# Patient Record
Sex: Female | Born: 1965 | Hispanic: No | Marital: Single | Smoking: Never smoker
Health system: Southern US, Community
[De-identification: ages and names within clinical notes are randomized; demographics above are authoritative.]

## PROBLEM LIST (undated history)

## (undated) DIAGNOSIS — E559 Vitamin D deficiency, unspecified: Secondary | ICD-10-CM

## (undated) HISTORY — PX: DIAPHRAGMATIC HERNIA REPAIR: SHX413

## (undated) HISTORY — PX: APPENDECTOMY: SHX54

## (undated) HISTORY — DX: Vitamin D deficiency, unspecified: E55.9

---

## 2017-06-23 ENCOUNTER — Ambulatory Visit (HOSPITAL_COMMUNITY)
Admission: EM | Admit: 2017-06-23 | Discharge: 2017-06-23 | Disposition: A | Discharge status: Nursing Home (Includes ICF) | Payer: Self-pay

## 2017-06-24 ENCOUNTER — Ambulatory Visit (INDEPENDENT_AMBULATORY_CARE_PROVIDER_SITE_OTHER): Payer: Self-pay

## 2017-06-24 ENCOUNTER — Encounter (HOSPITAL_COMMUNITY): Payer: Self-pay | Admitting: Emergency Medicine

## 2017-06-24 ENCOUNTER — Ambulatory Visit (HOSPITAL_COMMUNITY)
Admission: EM | Admit: 2017-06-24 | Discharge: 2017-06-24 | Disposition: A | Discharge status: Home / Self Care | Payer: Self-pay | Attending: Internal Medicine | Admitting: Internal Medicine

## 2017-06-24 DIAGNOSIS — M79672 Pain in left foot: Secondary | ICD-10-CM

## 2017-06-24 DIAGNOSIS — M2140 Flat foot [pes planus] (acquired), unspecified foot: Secondary | ICD-10-CM

## 2017-06-24 LAB — GLUCOSE, CAPILLARY: Glucose-Capillary: 80 mg/dL (ref 65–99)

## 2017-06-24 MED ORDER — NAPROXEN 500 MG PO TABS: 500.0000 mg | ORAL_TABLET | Freq: Two times a day (BID) | ORAL | 0 refills | Status: DC

## 2017-06-24 NOTE — ED Triage Notes (Signed)
PT reports pain in left foot for 3 weeks. Swelling and pain in right arm for months, and pain in both knees for 2 years.

## 2017-06-24 NOTE — Discharge Instructions (Addendum)
Take the medication as prescribed and try not to miss doses for the first 5 or so days.  Okay to wean off after that. Go to a shoe store and by a shoe that has a supportive arch.

## 2017-06-24 NOTE — ED Provider Notes (Signed)
CSN: [id number]     Arrival date & time 06/24/17  1742 History   None    Chief Complaint  Patient presents with  . Foot Pain   (Consider location/radiation/quality/duration/timing/severity/associated sxs/prior Treatment)  Foot Pain  This is a new problem. The current episode started more than 1 week ago. The problem occurs constantly. The problem has been gradually worsening. Pertinent negatives include no chest pain, no abdominal pain, no headaches and no shortness of breath. The symptoms are aggravated by walking. The symptoms are relieved by acetaminophen. She has tried acetaminophen for the symptoms. The treatment provided mild relief.    History reviewed. No pertinent past medical history. History reviewed. No pertinent surgical history. No family history on file. Social History  Substance Use Topics  . Smoking status: Not on file  . Smokeless tobacco: Not on file  . Alcohol use Not on file   OB History    No data available     Review of Systems  Respiratory: Negative for shortness of breath.   Cardiovascular: Negative for chest pain.  Gastrointestinal: Negative for abdominal pain.  Neurological: Negative for headaches.    Allergies  Patient has no known allergies.  Home Medications   Prior to Admission medications   Medication Sig Start Date End Date Taking? Authorizing Provider  naproxen (NAPROSYN) 500 MG tablet Take 1 tablet (500 mg total) by mouth 2 (two) times daily with a meal. Avoid NSAID medications while taking this. 06/24/17 07/04/17  Ofilia Neaslark, Allure Greaser L, PA-C   Meds Ordered and Administered this Visit  Medications - No data to display  BP 125/68   Pulse 77   Temp 98.9 F (37.2 C) (Oral)   Resp 16   Ht 5\' 1"  (1.549 m)   Wt 154 lb 5.2 oz (70 kg)   SpO2 100%   BMI 29.16 kg/m  No data found.   Physical Exam  Constitutional: She is oriented to person, place, and time. She appears well-nourished. No distress.  Eyes: Pupils are equal, round, and  reactive to light. EOM are normal.  Cardiovascular: Normal rate.   Pulmonary/Chest: Effort normal.  Abdominal: She exhibits no distension.  Musculoskeletal: She exhibits no edema, tenderness or deformity.  Mild tenderness about the distal 2nd and 3rd metacarpals. No rash, no swelling.   Neurological: She is alert and oriented to person, place, and time. No cranial nerve deficit. Gait normal.  Skin: Skin is dry. She is not diaphoretic.  Psychiatric: She has a normal mood and affect.  Vitals reviewed.   Urgent Care Course     Procedures (including critical care time)  Labs Review Labs Reviewed  GLUCOSE, CAPILLARY    Imaging Review Dg Foot Complete Left  Result Date: 06/24/2017 CLINICAL DATA:  Initial evaluation for acute metatarsal foot pain. EXAM: LEFT FOOT - COMPLETE 3+ VIEW COMPARISON:  None available. FINDINGS: No acute fracture or dislocation. Joint spaces are well maintained without evidence for significant degenerative or erosive arthropathy. Mild pes planus foot deformity noted. Osseous mineralization normal. No soft tissue abnormality. IMPRESSION: No acute osseous abnormality about the left foot. Electronically Signed   By: Rise MuBenjamin  McClintock M.D.   On: 06/24/2017 19:42          MDM   1. Left foot pain    Advised that she try an NSAID. Advised she get some arches given pes planus. She will come back if needed.     Ofilia Neaslark, Ole Lafon L, PA-C 06/24/17 1947

## 2017-06-26 ENCOUNTER — Emergency Department (HOSPITAL_COMMUNITY): Payer: Self-pay

## 2017-06-26 ENCOUNTER — Emergency Department (HOSPITAL_COMMUNITY)
Admission: EM | Admit: 2017-06-26 | Discharge: 2017-06-27 | Disposition: A | Discharge status: Home / Self Care | Payer: Self-pay | Attending: Emergency Medicine | Admitting: Emergency Medicine

## 2017-06-26 ENCOUNTER — Encounter (HOSPITAL_COMMUNITY): Payer: Self-pay | Admitting: Emergency Medicine

## 2017-06-26 DIAGNOSIS — R079 Chest pain, unspecified: Secondary | ICD-10-CM

## 2017-06-26 DIAGNOSIS — I700 Atherosclerosis of aorta: Secondary | ICD-10-CM

## 2017-06-26 HISTORY — DX: Atherosclerosis of aorta: I70.0

## 2017-06-26 LAB — CBC
HEMATOCRIT: 40.5 % (ref 36.0–46.0)
HEMOGLOBIN: 13.4 g/dL (ref 12.0–15.0)
MCH: 29.6 pg (ref 26.0–34.0)
MCHC: 33.1 g/dL (ref 30.0–36.0)
MCV: 89.4 fL (ref 78.0–100.0)
Platelets: 213 10*3/uL (ref 150–400)
RBC: 4.53 MIL/uL (ref 3.87–5.11)
RDW: 13.2 % (ref 11.5–15.5)
WBC: 5.5 10*3/uL (ref 4.0–10.5)

## 2017-06-26 LAB — BASIC METABOLIC PANEL
ANION GAP: 8 (ref 5–15)
BUN: 7 mg/dL (ref 6–20)
CALCIUM: 9.5 mg/dL (ref 8.9–10.3)
CHLORIDE: 103 mmol/L (ref 101–111)
CO2: 27 mmol/L (ref 22–32)
Creatinine, Ser: 0.72 mg/dL (ref 0.44–1.00)
GFR calc non Af Amer: >60 mL/min (ref >60)
GLUCOSE: 100 mg/dL — AB (ref 65–99)
POTASSIUM: 3.8 mmol/L (ref 3.5–5.1)
Sodium: 138 mmol/L (ref 135–145)

## 2017-06-26 LAB — I-STAT TROPONIN, ED: TROPONIN I, POC: 0 ng/mL (ref 0.00–0.08)

## 2017-06-26 LAB — TROPONIN I: Troponin I: <0.03 ng/mL (ref <0.03)

## 2017-06-26 LAB — D-DIMER, QUANTITATIVE: D-Dimer, Quant: 1.22 ug/mL-FEU — ABNORMAL HIGH (ref 0.00–0.50)

## 2017-06-26 MED ORDER — FENTANYL CITRATE (PF) 100 MCG/2ML IJ SOLN: 50.0000 ug | INTRAMUSCULAR | Status: DC | PRN

## 2017-06-26 MED ORDER — SODIUM CHLORIDE 0.9 % IV BOLUS (SEPSIS)
500.0000 mL | Freq: Once | INTRAVENOUS | Status: AC
  Administered 2017-06-27: 500 mL via INTRAVENOUS

## 2017-06-26 NOTE — ED Triage Notes (Signed)
Patient reports intermittent left chest pain radiating to back with mild SOB and headache onset this week , denies nausea or diaphoresis .

## 2017-06-26 NOTE — ED Provider Notes (Signed)
[facility]PT Provider Note   CSN: [id number] Arrival date & time: 06/26/17  1954     History   Chief Complaint Chief Complaint  Patient presents with  . Chest Pain    HPI Amy MillerSomia Flowers is a 51 y.o. female.  Patient with no significant medical history nonsmoker presents with left-sided chest pain and mild soreness of breath throughout the week. Fairly constant. Nothing specifically worsens it. No diaphoresis or exertional symptoms. No cardiac or blood clot history or risk factors.      History reviewed. No pertinent past medical history.  There are no active problems to display for this patient.   Past Surgical History:  Procedure Laterality Date  . ABDOMINAL SURGERY      OB History    No data available       Home Medications    Prior to Admission medications   Medication Sig Start Date End Date Taking? Authorizing Provider  naproxen (NAPROSYN) 500 MG tablet Take 1 tablet (500 mg total) by mouth 2 (two) times daily with a meal. Avoid NSAID medications while taking this. 06/24/17 07/04/17  Ofilia Neaslark, Michael L, PA-C    Family History No family history on file.  Social History Social History  Substance Use Topics  . Smoking status: Never Smoker  . Smokeless tobacco: Never Used  . Alcohol use No     Allergies   Patient has no known allergies.   Review of Systems Review of Systems  Constitutional: Negative for chills and fever.  HENT: Negative for congestion.   Eyes: Negative for visual disturbance.  Respiratory: Positive for shortness of breath.   Cardiovascular: Positive for chest pain. Negative for leg swelling.  Gastrointestinal: Negative for abdominal pain and vomiting.  Genitourinary: Negative for dysuria and flank pain.  Musculoskeletal: Negative for back pain, neck pain and neck stiffness.  Skin: Negative for rash.  Neurological: Negative for light-headedness and headaches.     Physical Exam Updated Vital Signs BP 117/69 (BP Location:  Left Arm)   Pulse 73   Temp 98.3 F (36.8 C) (Oral)   Resp 14   SpO2 100%   Physical Exam  Constitutional: She is oriented to person, place, and time. She appears well-developed and well-nourished.  HENT:  Head: Normocephalic and atraumatic.  Eyes: Conjunctivae are normal. Right eye exhibits no discharge. Left eye exhibits no discharge.  Neck: Normal range of motion. Neck supple. No tracheal deviation present.  Cardiovascular: Normal rate and regular rhythm.   Pulmonary/Chest: Effort normal and breath sounds normal.  Abdominal: Soft. She exhibits no distension. There is no tenderness. There is no guarding.  Musculoskeletal: She exhibits no edema.  Neurological: She is alert and oriented to person, place, and time.  Skin: Skin is warm. No rash noted.  Psychiatric: She has a normal mood and affect.  Nursing note and vitals reviewed.    ED Treatments / Results  Labs (all labs ordered are listed, but only abnormal results are displayed) Labs Reviewed  BASIC METABOLIC PANEL - Abnormal; Notable for the following:       Result Value   Glucose, Bld 100 (*)    All other components within normal limits  D-DIMER, QUANTITATIVE (NOT AT [facility]) - Abnormal; Notable for the following:    D-Dimer, Quant 1.22 (*)    All other components within normal limits  CBC  TROPONIN I  I-STAT TROPONIN, ED    EKG  EKG Interpretation  Date/Time:  Friday June 26 2017 20:00:51 EDT Ventricular Rate:  81  PR Interval:  152 QRS Duration: 80 QT Interval:  384 QTC Calculation: 446 R Axis:   -2 Text Interpretation:  Normal sinus rhythm Normal ECG Confirmed by Blane Ohara [phone]) on 06/26/2017 10:31:46 PM       Radiology Ct Angio Chest Pe W And/or Wo Contrast  Result Date: 06/27/2017 CLINICAL DATA:  Chest pain x1 week with positive D-dimer. EXAM: CT ANGIOGRAPHY CHEST WITH CONTRAST TECHNIQUE: Multidetector CT imaging of the chest was performed using the standard protocol during bolus administration  of intravenous contrast. Multiplanar CT image reconstructions and MIPs were obtained to evaluate the vascular anatomy. CONTRAST:  80 cc Isovue 370 IV COMPARISON:  CXR from the same day FINDINGS: Cardiovascular: The study is of quality for the evaluation of pulmonary embolism. There are no filling defects in the central, lobar, segmental or subsegmental pulmonary artery branches to suggest acute pulmonary embolism. Great vessels are normal in course and caliber. Normal heart size. No significant pericardial fluid/thickening. Minimal aortic atherosclerosis at the arch. Mediastinum/Nodes: No discrete thyroid nodules. Unremarkable esophagus. No pathologically enlarged axillary, mediastinal or hilar lymph nodes. Small hiatal hernia. Lungs/Pleura: No pneumothorax. No pleural effusion. Dependent atelectasis noted bilaterally. Upper abdomen: Unremarkable. Musculoskeletal:  No aggressive appearing focal osseous lesions. Review of the MIP images confirms the above findings. IMPRESSION: No acute pulmonary embolus. Small hiatal hernia. Aortic Atherosclerosis (ICD10-I70.0). Electronically Signed   By: Tollie Eth M.D.   On: 06/27/2017 02:06    Procedures Procedures (including critical care time)  Medications Ordered in ED Medications  sodium chloride 0.9 % bolus 500 mL (0 mLs Intravenous Stopped 06/27/17 0106)  iopamidol (ISOVUE-370) 76 % injection (100 mLs  Contrast Given 06/27/17 0119)     Initial Impression / Assessment and Plan / ED Course  I have reviewed the triage vital signs and the nursing notes.  Pertinent labs & imaging results that were available during my care of the patient were reviewed by me and considered in my medical decision making (see chart for details).    Patient presents with left-sided chest pain intermittent this week. Patient is very low risk cardiac and low risk for embolism. D-dimer positive plan for CT scan for further delineation delta troponin. If unremarkable outpatient  follow-up with primary doctor/cardiology for further evaluation. Pain meds ordered.  Results and differential diagnosis were discussed with the patient/parent/guardian. Xrays were independently reviewed by myself.  Close follow up outpatient was discussed, comfortable with the plan.   Medications  sodium chloride 0.9 % bolus 500 mL (0 mLs Intravenous Stopped 06/27/17 0106)  iopamidol (ISOVUE-370) 76 % injection (100 mLs  Contrast Given 06/27/17 0119)    Vitals:   06/27/17 0045 06/27/17 0100 06/27/17 0145 06/27/17 0217  BP: 116/75 119/71 126/74 117/69  Pulse: 69 71 84 73  Resp: 16 17 16 14   Temp:      TempSrc:      SpO2: 99% 99% 99% 100%    Final diagnoses:  Acute chest pain     Final Clinical Impressions(s) / ED Diagnoses   Final diagnoses:  Acute chest pain    New Prescriptions Discharge Medication List as of 06/27/2017  2:15 AM       Blane Ohara, MD 06/28/17 2332

## 2017-06-26 NOTE — Discharge Instructions (Signed)
If you were given medicines take as directed.  If you are on coumadin or contraceptives realize their levels and effectiveness is altered by many different medicines.  If you have any reaction (rash, tongues swelling, other) to the medicines stop taking and see a physician.    If your blood pressure was elevated in the ER make sure you follow up for management with a primary doctor or return for chest pain, shortness of breath or stroke symptoms.  Please follow up as directed and return to the ER or see a physician for new or worsening symptoms.  Thank you. Vitals:   06/26/17 2007 06/26/17 2204 06/26/17 2215  BP: 134/71 122/63 106/65  Pulse: 86 66 66  Resp: 20 16 15   Temp: 98.3 F (36.8 C)    TempSrc: Oral    SpO2: 98% 100% 100%

## 2017-06-27 ENCOUNTER — Encounter (HOSPITAL_COMMUNITY): Payer: Self-pay | Admitting: Radiology

## 2017-06-27 ENCOUNTER — Emergency Department (HOSPITAL_COMMUNITY): Payer: Self-pay

## 2017-06-27 MED ORDER — IOPAMIDOL (ISOVUE-370) INJECTION 76%
INTRAVENOUS | Status: AC
  Administered 2017-06-27: 100 mL
  Filled 2017-06-27: qty 100

## 2017-06-27 NOTE — ED Notes (Signed)
Patient transported to CT 

## 2017-06-28 NOTE — ED Provider Notes (Signed)
50 you F with chest pain. Signed out by Dr. Jodi MourningZavitz.  Plain is to await CT PE scan.  CT without PE.  D/c home.    Melene PlanFloyd, Basia Mcginty, DO 06/28/17 0205

## 2017-07-01 ENCOUNTER — Ambulatory Visit: Payer: Self-pay | Attending: Internal Medicine | Admitting: Physician Assistant

## 2017-07-01 VITALS — BP 120/76 | HR 77 | Temp 98.2°F | Resp 16 | Wt 151.0 lb

## 2017-07-01 DIAGNOSIS — Z7982 Long term (current) use of aspirin: Secondary | ICD-10-CM | POA: Insufficient documentation

## 2017-07-01 DIAGNOSIS — M25511 Pain in right shoulder: Secondary | ICD-10-CM | POA: Insufficient documentation

## 2017-07-01 DIAGNOSIS — M25512 Pain in left shoulder: Secondary | ICD-10-CM | POA: Insufficient documentation

## 2017-07-01 DIAGNOSIS — M79601 Pain in right arm: Secondary | ICD-10-CM

## 2017-07-01 DIAGNOSIS — R52 Pain, unspecified: Secondary | ICD-10-CM

## 2017-07-01 DIAGNOSIS — M79602 Pain in left arm: Secondary | ICD-10-CM

## 2017-07-01 DIAGNOSIS — M170 Bilateral primary osteoarthritis of knee: Secondary | ICD-10-CM | POA: Insufficient documentation

## 2017-07-01 DIAGNOSIS — M79605 Pain in left leg: Secondary | ICD-10-CM | POA: Insufficient documentation

## 2017-07-01 DIAGNOSIS — M79604 Pain in right leg: Secondary | ICD-10-CM | POA: Insufficient documentation

## 2017-07-01 MED ORDER — MELOXICAM 7.5 MG PO TABS: 7.5000 mg | ORAL_TABLET | Freq: Every day | ORAL | 0 refills | Status: DC

## 2017-07-01 MED ORDER — ASPIRIN EC 81 MG PO TBEC: 81.0000 mg | DELAYED_RELEASE_TABLET | Freq: Every day | ORAL | 3 refills | Status: DC

## 2017-07-01 MED FILL — MELOXICAM 7.5 MG TABLET: 7.5 | 30 days supply | Qty: 30 | Fill #0

## 2017-07-01 NOTE — Progress Notes (Signed)
Patient ID: Amy Flowers, female   DOB: 30-Jun-1966, 51 y.o.   MRN: [id number]    Amy Flowers, is a 51 y.o. female  QVZ:[id number]  PIR:[id number]  DOB - 07/01/1966  Subjective:  Chief Complaint and HPI: Amy Flowers is a 51 y.o. female here today to establish care and for a follow up visit After multiple urgent care/ED visits within the last month.  06/23/2017. 06/24/2017, and 06/26/2017.  She was treated for L foot pain then CP.  D-Dimer was +, but CT was neg; PE was R/O.   Cardiac enzymes were negative and EKG showed no acute changes.  Labs other than + D-dimer unremarkable.  She has been in the [place] from the [place] for about 7 months.    Today she c/o with continued B knee pain.  Initially they tell me this has only been going on for 3 months but then later tell me she has actually had to have injections in her knees in the past in the [place].  She also c/o B upper arm and shoulder pain.  SHe denies any tick bites.  NKI.  She is R hand dominant.  Denies new activities.  Says Naproxen is not effective for her leg pain.  Denies further CP.  She denies any previous health issues.  Daughter is translating/interpretaive services refused.    ED/Hospital notes reviewed.   Social History:  Here staying with daughter from [place]  ROS:   Constitutional:  No f/c, No night sweats, No unexplained weight loss. EENT:  No vision changes, No blurry vision, No hearing changes. No mouth, throat, or ear problems.  Respiratory: No cough, No SOB Cardiac: No CP, no palpitations GI:  No abd pain, No N/V/D. GU: No Urinary s/sx Musculoskeletal: pain as above Neuro: No headache, no dizziness, no motor weakness.  Skin: No rash Endocrine:  No polydipsia. No polyuria.  Psych: Denies SI/HI  No problems updated.  ALLERGIES: No Known Allergies  PAST MEDICAL HISTORY: No past medical history on file.  MEDICATIONS AT HOME: Prior to Admission medications   Medication Sig Start Date End Date Taking? Authorizing Provider    aspirin EC 81 MG tablet Take 1 tablet (81 mg total) by mouth daily. 07/01/17   Anders Simmonds, PA-C  meloxicam (MOBIC) 7.5 MG tablet Take 1 tablet (7.5 mg total) by mouth daily. 07/01/17   Anders Simmonds, PA-C     Objective:  EXAM:   Vitals:   07/01/17 1600  BP: 120/76  Pulse: 77  Resp: 16  Temp: 98.2 F (36.8 C)  TempSrc: Oral  SpO2: 99%  Weight: 151 lb (68.5 kg)    General appearance : A&OX3. NAD. Non-toxic-appearing HEENT: Atraumatic and Normocephalic.  PERRLA. EOM intact.  Neck: supple, no JVD. No cervical lymphadenopathy. No thyromegaly Chest/Lungs:  Breathing-non-labored, Good air entry bilaterally, breath sounds normal without rales, rhonchi, or wheezing  CVS: S1 S2 regular, no murmurs, gallops, rubs  Extremities: Bilateral Lower Ext shows no edema, both legs are warm to touch with = pulse throughout B arms-full S&ROM from all joints including hands,wrists, elbows, shoulders.  B knees-full S and ROM.  No swelling.  +crepitus B but ligaments/joints are stable B.   Neurology:  CN II-XII grossly intact, Non focal.   Psych:  TP linear. J/I WNL. Normal speech. Appropriate eye contact and affect.  Skin:  No Rash  Data Review No results found for: HGBA1C   Assessment & Plan   1. Pain in both lower extremities Chronic with previous B  knee injections-may need referral-will give orange card application as the patient anticipates establishing residency here.  Stop Naproxen - meloxicam (MOBIC) 7.5 MG tablet; Take 1 tablet (7.5 mg total) by mouth daily.  Dispense: 30 tablet; Refill: 0  2. Pain in both upper extremities - meloxicam (MOBIC) 7.5 MG tablet; Take 1 tablet (7.5 mg total) by mouth daily.  Dispense: 30 tablet; Refill: 0  3. Body aches - meloxicam (MOBIC) 7.5 MG tablet; Take 1 tablet (7.5 mg total) by mouth daily.  Dispense: 30 tablet; Refill: 0 - Vitamin D, 25-hydroxy - TSH - Sedimentation Rate  Start baby aspirin for heart protective benefit.   Patient  have been counseled extensively about nutrition and exercise  Return in about 1 month (around 08/01/2017) for establish care; f/up on body aches.  The patient was given clear instructions to go to ER or return to medical center if symptoms don't improve, worsen or new problems develop. The patient verbalized understanding. The patient was told to call to get lab results if they haven't heard anything in the next week.     Georgian Co, PA-C [facility] [place], [place] [phone]   07/01/2017, 4:25 PM

## 2017-07-02 ENCOUNTER — Other Ambulatory Visit: Payer: Self-pay | Admitting: Physician Assistant

## 2017-07-02 DIAGNOSIS — E559 Vitamin D deficiency, unspecified: Secondary | ICD-10-CM

## 2017-07-02 LAB — TSH: TSH: 1.92 u[IU]/mL (ref 0.450–4.500)

## 2017-07-02 LAB — SEDIMENTATION RATE: SED RATE: 19 mm/h (ref 0–40)

## 2017-07-02 LAB — VITAMIN D 25 HYDROXY (VIT D DEFICIENCY, FRACTURES): VIT D 25 HYDROXY: 11.3 ng/mL — AB (ref 30.0–100.0)

## 2017-07-02 MED ORDER — VITAMIN D (ERGOCALCIFEROL) 1.25 MG (50000 UNIT) PO CAPS: 50000.0000 [IU] | ORAL_CAPSULE | ORAL | 0 refills | Status: DC

## 2017-07-10 NOTE — Progress Notes (Signed)
Contact the patient with the results the provider sent to you.

## 2017-08-13 ENCOUNTER — Encounter: Payer: Self-pay | Admitting: Family Medicine

## 2017-08-13 ENCOUNTER — Ambulatory Visit: Payer: Self-pay | Attending: Family Medicine | Admitting: Family Medicine

## 2017-08-13 DIAGNOSIS — M25561 Pain in right knee: Secondary | ICD-10-CM | POA: Insufficient documentation

## 2017-08-13 DIAGNOSIS — M170 Bilateral primary osteoarthritis of knee: Secondary | ICD-10-CM | POA: Insufficient documentation

## 2017-08-13 DIAGNOSIS — M25562 Pain in left knee: Secondary | ICD-10-CM | POA: Insufficient documentation

## 2017-08-13 MED ORDER — MELOXICAM 7.5 MG PO TABS: 7.5000 mg | ORAL_TABLET | Freq: Every day | ORAL | 2 refills | Status: DC

## 2017-08-13 MED FILL — MELOXICAM 7.5 MG TABLET: 7.5 | 30 days supply | Qty: 30 | Fill #0

## 2017-08-13 NOTE — Patient Instructions (Signed)

## 2017-08-13 NOTE — Progress Notes (Signed)
Subjective:  Patient ID: Amy Flowers, female    DOB: 1966-10-10  Age: 51 y.o. MRN: [id number]  CC: Knee Pain   HPI Amy Flowers is a 51 year old female from [place] who presents today accompanied by her daughter for follow-up of bilateral knee pain. She was seen by the PA at her last visit after she had presented with generalized body aches, knee pains and was placed on meloxicam which she tolerated well.  She reports improvement in symptoms while she was taking meloxicam which she has run out of this time. Denies swelling of her knees.  She declines a flu shot today.     No past medical history on file.  Past Surgical History:  Procedure Laterality Date  . ABDOMINAL SURGERY      No Known Allergies   Outpatient Medications Prior to Visit  Medication Sig Dispense Refill  . aspirin EC 81 MG tablet Take 1 tablet (81 mg total) by mouth daily. 90 tablet 3  . Vitamin D, Ergocalciferol, (DRISDOL) 50000 units CAPS capsule Take 1 capsule (50,000 Units total) by mouth every 7 (seven) days. 16 capsule 0  . meloxicam (MOBIC) 7.5 MG tablet Take 1 tablet (7.5 mg total) by mouth daily. 30 tablet 0   No facility-administered medications prior to visit.     ROS Review of Systems  Constitutional: Negative for activity change, appetite change and fatigue.  HENT: Negative for congestion, sinus pressure and sore throat.   Eyes: Negative for visual disturbance.  Respiratory: Negative for cough, chest tightness, shortness of breath and wheezing.   Cardiovascular: Negative for chest pain and palpitations.  Gastrointestinal: Negative for abdominal distention, abdominal pain and constipation.  Endocrine: Negative for polydipsia.  Genitourinary: Negative for dysuria and frequency.  Musculoskeletal:       See hpi  Skin: Negative for rash.  Neurological: Negative for tremors, light-headedness and numbness.  Hematological: Does not bruise/bleed easily.  Psychiatric/Behavioral: Negative for  agitation and behavioral problems.    Objective:  BP 109/71   Pulse 75   Temp 97.6 F (36.4 C) (Oral)   Wt 151 lb 12.8 oz (68.9 kg)   SpO2 99%   BMI 28.68 kg/m   BP/Weight 08/13/2017 07/01/2017 06/27/2017  Systolic BP 109 120 117  Diastolic BP 71 76 69  Wt. (Lbs) 151.8 151 -  BMI 28.68 28.53 -      Physical Exam  Constitutional: She is oriented to person, place, and time. She appears well-developed and well-nourished.  Cardiovascular: Normal rate, normal heart sounds and intact distal pulses.   No murmur heard. Pulmonary/Chest: Effort normal and breath sounds normal. She has no wheezes. She has no rales. She exhibits no tenderness.  Abdominal: Soft. Bowel sounds are normal. She exhibits no distension and no mass. There is no tenderness.  Musculoskeletal: Normal range of motion. She exhibits no tenderness.  Crepitus on flexion and extension of both knees  Neurological: She is alert and oriented to person, place, and time.  Skin: Skin is warm and dry.     Assessment & Plan:   1. Primary osteoarthritis of both knees Controlled on NSAIDs Use knee brace as needed - meloxicam (MOBIC) 7.5 MG tablet; Take 1 tablet (7.5 mg total) by mouth daily.  Dispense: 30 tablet; Refill: 2   Meds ordered this encounter  Medications  . meloxicam (MOBIC) 7.5 MG tablet    Sig: Take 1 tablet (7.5 mg total) by mouth daily.    Dispense:  30 tablet    Refill:  2  Follow-up: Return in about 1 month (around 09/12/2017) for Complete physical exam.   Jaclyn Shaggy MD

## 2017-09-28 ENCOUNTER — Encounter: Payer: Self-pay | Admitting: Family Medicine

## 2017-09-28 ENCOUNTER — Ambulatory Visit: Payer: Self-pay | Attending: Family Medicine | Admitting: Family Medicine

## 2017-09-28 VITALS — BP 122/76 | HR 71 | Temp 97.8°F | Ht 61.0 in | Wt 151.0 lb

## 2017-09-28 DIAGNOSIS — Z124 Encounter for screening for malignant neoplasm of cervix: Secondary | ICD-10-CM

## 2017-09-28 DIAGNOSIS — Z1159 Encounter for screening for other viral diseases: Secondary | ICD-10-CM

## 2017-09-28 DIAGNOSIS — Z1239 Encounter for other screening for malignant neoplasm of breast: Secondary | ICD-10-CM

## 2017-09-28 DIAGNOSIS — E559 Vitamin D deficiency, unspecified: Secondary | ICD-10-CM

## 2017-09-28 DIAGNOSIS — Z23 Encounter for immunization: Secondary | ICD-10-CM

## 2017-09-28 DIAGNOSIS — Z1231 Encounter for screening mammogram for malignant neoplasm of breast: Secondary | ICD-10-CM

## 2017-09-28 DIAGNOSIS — Z9889 Other specified postprocedural states: Secondary | ICD-10-CM | POA: Insufficient documentation

## 2017-09-28 DIAGNOSIS — Z1211 Encounter for screening for malignant neoplasm of colon: Secondary | ICD-10-CM

## 2017-09-28 DIAGNOSIS — Z0000 Encounter for general adult medical examination without abnormal findings: Secondary | ICD-10-CM

## 2017-09-28 DIAGNOSIS — Z7982 Long term (current) use of aspirin: Secondary | ICD-10-CM | POA: Insufficient documentation

## 2017-09-28 HISTORY — DX: Vitamin D deficiency, unspecified: E55.9

## 2017-09-28 NOTE — Patient Instructions (Signed)

## 2017-09-28 NOTE — Progress Notes (Signed)
Subjective:  Patient ID: Amy Flowers, female    DOB: [birth year]  Age: 51 y.o. MRN: [id number]  CC: Annual Exam   HPI Amy Flowers presents for a complete physical exam.   Past Medical History:  Diagnosis Date  . Vitamin D deficiency 09/28/2017      Past Surgical History:  Procedure Laterality Date  . ABDOMINAL SURGERY       Outpatient Medications Prior to Visit  Medication Sig Dispense Refill  . aspirin EC 81 MG tablet Take 1 tablet (81 mg total) by mouth daily. 90 tablet 3  . meloxicam (MOBIC) 7.5 MG tablet Take 1 tablet (7.5 mg total) by mouth daily. 30 tablet 2  . Vitamin D, Ergocalciferol, (DRISDOL) 50000 units CAPS capsule Take 1 capsule (50,000 Units total) by mouth every 7 (seven) days. 16 capsule 0   No facility-administered medications prior to visit.     ROS Review of Systems  Constitutional: Negative for activity change, appetite change and fatigue.  HENT: Negative for congestion, sinus pressure and sore throat.   Eyes: Negative for visual disturbance.  Respiratory: Negative for cough, chest tightness, shortness of breath and wheezing.   Cardiovascular: Negative for chest pain and palpitations.  Gastrointestinal: Negative for abdominal distention, abdominal pain and constipation.  Endocrine: Negative for polydipsia.  Genitourinary: Negative for dysuria and frequency.  Musculoskeletal: Negative for arthralgias and back pain.  Skin: Negative for rash.  Neurological: Negative for tremors, light-headedness and numbness.  Hematological: Does not bruise/bleed easily.  Psychiatric/Behavioral: Negative for agitation and behavioral problems.    Objective:  BP 122/76   Pulse 71   Temp 97.8 F (36.6 C) (Oral)   Ht 5\' 1"  (1.549 m)   Wt 151 lb (68.5 kg)   SpO2 99%   BMI 28.53 kg/m   BP/Weight 09/28/2017 08/13/2017 07/01/2017  Systolic BP 122 109 120  Diastolic BP 76 71 76  Wt. (Lbs) 151 151.8 151  BMI 28.53 28.68 28.53      Physical Exam    Constitutional: She is oriented to person, place, and time. She appears well-developed and well-nourished. No distress.  HENT:  Head: Normocephalic.  Right Ear: External ear normal.  Left Ear: External ear normal.  Nose: Nose normal.  Mouth/Throat: Oropharynx is clear and moist.  Eyes: Pupils are equal, round, and reactive to light. Conjunctivae and EOM are normal.  Neck: Normal range of motion. No JVD present.  Cardiovascular: Normal rate, regular rhythm, normal heart sounds and intact distal pulses.  Exam reveals no gallop.   No murmur heard. Pulmonary/Chest: Effort normal and breath sounds normal. No respiratory distress. She has no wheezes. She has no rales. She exhibits no tenderness. Right breast exhibits no mass, no nipple discharge and no tenderness. Left breast exhibits no mass, no nipple discharge and no tenderness.  Abdominal: Soft. Bowel sounds are normal. She exhibits no distension and no mass. There is no tenderness.  Genitourinary:  Genitourinary Comments: External genitalia, vagina, cervix, adnexa-normal  Musculoskeletal: Normal range of motion. She exhibits no edema or tenderness.  Neurological: She is alert and oriented to person, place, and time. She has normal reflexes.  Skin: Skin is warm and dry. She is not diaphoretic.  Psychiatric: She has a normal mood and affect.     Assessment & Plan:   1. Annual physical exam Counseled on 150 minutes of exercise every week, healthy eating, routine healthcare maintenance.   2. Vitamin D deficiency - VITAMIN D 25 Hydroxy (Vit-D Deficiency, Fractures)  3. Screening for  cervical cancer - Cytology - PAP [place]  4. Screening for breast cancer - MM Digital Screening; Future  5. Screening for colon cancer - Ambulatory referral to Gastroenterology  6. Need for influenza vaccination - Flu Vaccine QUAD 36+ mos IM  7. Need for Tdap vaccination Tdap administered  8. Screening for viral disease - Hepatitis c  antibody (reflex) - HIV antibody (with reflex)   No orders of the defined types were placed in this encounter.   Follow-up: Return in about 6 months (around 03/29/2018) for follow up of chronic medical conditions.   Jaclyn Shaggy MD

## 2017-09-29 ENCOUNTER — Other Ambulatory Visit: Payer: Self-pay | Admitting: Family Medicine

## 2017-09-29 LAB — HIV ANTIBODY (ROUTINE TESTING W REFLEX): HIV SCREEN 4TH GENERATION: NONREACTIVE

## 2017-09-29 LAB — HCV COMMENT:

## 2017-09-29 LAB — VITAMIN D 25 HYDROXY (VIT D DEFICIENCY, FRACTURES): Vit D, 25-Hydroxy: 11.8 ng/mL — ABNORMAL LOW (ref 30.0–100.0)

## 2017-09-29 LAB — HEPATITIS C ANTIBODY (REFLEX)

## 2017-09-29 MED ORDER — VITAMIN D (ERGOCALCIFEROL) 1.25 MG (50000 UNIT) PO CAPS: 50000.0000 [IU] | ORAL_CAPSULE | ORAL | 0 refills | Status: DC

## 2017-09-30 LAB — CYTOLOGY - PAP
Diagnosis: NEGATIVE
HPV (WINDOPATH): NOT DETECTED

## 2017-11-05 ENCOUNTER — Encounter: Payer: Self-pay | Admitting: Family Medicine

## 2018-11-08 ENCOUNTER — Ambulatory Visit: Payer: Self-pay | Attending: Family Medicine

## 2019-01-25 ENCOUNTER — Ambulatory Visit: Payer: Self-pay | Admitting: Family Medicine

## 2019-02-09 ENCOUNTER — Other Ambulatory Visit: Payer: Self-pay

## 2019-02-09 ENCOUNTER — Ambulatory Visit: Payer: Self-pay | Attending: Family Medicine | Admitting: Critical Care Medicine

## 2019-02-09 ENCOUNTER — Encounter: Payer: Self-pay | Admitting: Critical Care Medicine

## 2019-02-09 VITALS — BP 118/74 | HR 88 | Temp 98.2°F | Resp 17 | Ht 62.0 in | Wt 162.2 lb

## 2019-02-09 DIAGNOSIS — R1319 Other dysphagia: Secondary | ICD-10-CM

## 2019-02-09 DIAGNOSIS — R1310 Dysphagia, unspecified: Secondary | ICD-10-CM

## 2019-02-09 DIAGNOSIS — R0600 Dyspnea, unspecified: Secondary | ICD-10-CM | POA: Insufficient documentation

## 2019-02-09 DIAGNOSIS — Z1211 Encounter for screening for malignant neoplasm of colon: Secondary | ICD-10-CM

## 2019-02-09 DIAGNOSIS — Z1239 Encounter for other screening for malignant neoplasm of breast: Secondary | ICD-10-CM

## 2019-02-09 DIAGNOSIS — R0602 Shortness of breath: Secondary | ICD-10-CM

## 2019-02-09 NOTE — Progress Notes (Signed)
Subjective:    Patient ID: Amy Flowers, female    DOB: February 18, 1966, 53 y.o.   MRN: [id number]  53 y.o. F Arabic here for cough and dyspnea and to re establish care  Never smoker only hx of Vit D def.  Last Here 2018  She notes several months of difficulty swallowing and only is dyspneic during /after meals.  No real GERD symptoms.   Food does stick in her mid chest area.  No prior GI hx.   Has been in [place] several years from [place]. No weight loss  Needs Flu vaccine and colon/mammogram   See shortness of breath section below     Shortness of Breath  This is a recurrent problem. The current episode started more than 1 month ago. The problem occurs intermittently (comes on with meals). Associated symptoms include chest pain, headaches and sputum production. Pertinent negatives include no ear pain, fever, hemoptysis, leg pain, leg swelling, orthopnea, PND, rhinorrhea, sore throat, vomiting or wheezing. The symptoms are aggravated by eating. Associated symptoms comments: Chest tightness. The patient has no known risk factors for DVT/PE. She has tried nothing for the symptoms. There is no history of allergies, asthma, CAD, COPD, DVT, a heart failure, PE or pneumonia.     Past Medical History:  Diagnosis Date  . Vitamin D deficiency 09/28/2017     History reviewed. No pertinent family history.   Social History   Socioeconomic History  . Marital status: Single    Spouse name: Not on file  . Number of children: Not on file  . Years of education: Not on file  . Highest education level: Not on file  Occupational History  . Not on file  Social Needs  . Financial resource strain: Not on file  . Food insecurity:    Worry: Not on file    Inability: Not on file  . Transportation needs:    Medical: Not on file    Non-medical: Not on file  Tobacco Use  . Smoking status: Never Smoker  . Smokeless tobacco: Never Used  Substance and Sexual Activity  . Alcohol use: No  . Drug use: No  .  Sexual activity: Not on file  Lifestyle  . Physical activity:    Days per week: Not on file    Minutes per session: Not on file  . Stress: Not on file  Relationships  . Social connections:    Talks on phone: Not on file    Gets together: Not on file    Attends religious service: Not on file    Active member of club or organization: Not on file    Attends meetings of clubs or organizations: Not on file    Relationship status: Not on file  . Intimate partner violence:    Fear of current or ex partner: Not on file    Emotionally abused: Not on file    Physically abused: Not on file    Forced sexual activity: Not on file  Other Topics Concern  . Not on file  Social History Narrative  . Not on file     No Known Allergies   Outpatient Medications Prior to Visit  Medication Sig Dispense Refill  . aspirin EC 81 MG tablet Take 1 tablet (81 mg total) by mouth daily. 90 tablet 3  . meloxicam (MOBIC) 7.5 MG tablet Take 1 tablet (7.5 mg total) by mouth daily. 30 tablet 2  . Vitamin D, Ergocalciferol, (DRISDOL) 50000 units CAPS capsule Take 1  capsule (50,000 Units total) by mouth every 7 (seven) days. 16 capsule 0   No facility-administered medications prior to visit.     Review of Systems  Constitutional: Negative for appetite change and fever.  HENT: Positive for trouble swallowing. Negative for ear pain, postnasal drip, rhinorrhea, sinus pressure, sinus pain, sneezing and sore throat.        Choking with food  Respiratory: Positive for sputum production, choking, chest tightness and shortness of breath. Negative for cough, hemoptysis and wheezing.   Cardiovascular: Positive for chest pain. Negative for orthopnea, leg swelling and PND.  Gastrointestinal: Negative for vomiting.       No GERD  Neurological: Positive for headaches.       Objective:   Physical Exam Vitals:   02/09/19 1205  BP: 118/74  Pulse: 88  Resp: 17  Temp: 98.2 F (36.8 C)  TempSrc: Oral  SpO2: 98%    Weight: 162 lb 3.2 oz (73.6 kg)  Height: 5\' 2"  (1.575 m)    Gen: Pleasant, well-nourished, in no distress,  normal affect  ENT: No lesions,  mouth clear,  oropharynx clear, no postnasal drip  Neck: No JVD, no TMG, no carotid bruits  Lungs: No use of accessory muscles, no dullness to percussion, clear without rales or rhonchi  Cardiovascular: RRR, heart sounds normal, no murmur or gallops, no peripheral edema  Abdomen: soft and NT, no HSM,  BS normal  Musculoskeletal: No deformities, no cyanosis or clubbing  Neuro: alert, non focal  Skin: Warm, no lesions or rashes  No results found. BMP Latest Ref Rng & Units 02/09/2019 06/26/2017  Glucose 65 - 99 mg/dL 89 253(G)  BUN 6 - 24 mg/dL 10 7  Creatinine 6.44 - 1.00 mg/dL 0.34 7.42  BUN/Creat Ratio 9 - 23 13 -  Sodium 134 - 144 mmol/L 141 138  Potassium 3.5 - 5.2 mmol/L 4.1 3.8  Chloride 96 - 106 mmol/L 102 103  CO2 20 - 29 mmol/L 23 27  Calcium 8.7 - 10.2 mg/dL 9.4 9.5   CBC Latest Ref Rng & Units 02/09/2019 06/26/2017  WBC 3.4 - 10.8 x10E3/uL 5.0 5.5  Hemoglobin 11.1 - 15.9 g/dL 59.5 63.8  Hematocrit 75.6 - 46.6 % 39.1 40.5  Platelets 150 - 450 x10E3/uL 252 213   Wt Readings from Last 3 Encounters:  02/09/19 162 lb 3.2 oz (73.6 kg)  09/28/17 151 lb (68.5 kg)  08/13/17 151 lb 12.8 oz (68.9 kg)       Assessment & Plan:  I personally reviewed all images and lab data in the [facility] system as well as any outside material available during this office visit and agree with the  radiology impressions.   Dyspnea Dyspnea , concerned is due to ongoing dysphagia. Chest exam is normal.  CT Angio 2018 of chest was normal. SpO2 normal  Non smoker.  No exam evidence of airway obstruction  Note CMET and CBC done today was NORMAL  Needs:  Repeat CXR Esophogram to r/o stricture.    Dysphagia R/o esophageal issue  Esophogram ordered    Amy Flowers was seen today for follow-up.  Diagnoses and all orders for this visit:  Breast cancer  screening -     MM DIGITAL SCREENING BILATERAL; Future  Esophageal dysphagia -     DG ESOPHAGUS W SINGLE CM (SOL OR THIN BA); Future -     Comprehensive metabolic panel -     CBC with Differential/Platelet -     DG Chest 2 View; Future  Shortness of breath -     Comprehensive metabolic panel -     CBC with Differential/Platelet -     DG Chest 2 View; Future  Colon cancer screening -     Fecal occult blood, imunochemical   Colon cancer screen with fecal occult blood card Flu vaccine offered, will return as we are out of flu vaccines Mammogram ordered

## 2019-02-09 NOTE — Patient Instructions (Signed)
An x-ray of your esophagus food tube will be scheduled in which she will be swallowing a contrast agent to get pictures of the esophagus  Labs today will include a metabolic panel and complete blood count  We are out of flu vaccines today we will administer this at the next visit  A stool card will be given to check for blood in the stool  A mammogram will be scheduled to check for breast cancer screening  Return to see Dr. Delford Field in the next 2 weeks after the esophagram is complete

## 2019-02-10 LAB — COMPREHENSIVE METABOLIC PANEL
A/G RATIO: 1.5 (ref 1.2–2.2)
ALBUMIN: 4.3 g/dL (ref 3.8–4.9)
ALT: 15 IU/L (ref 0–32)
AST: 17 IU/L (ref 0–40)
Alkaline Phosphatase: 61 IU/L (ref 39–117)
BILIRUBIN TOTAL: 0.2 mg/dL (ref 0.0–1.2)
BUN / CREAT RATIO: 13 (ref 9–23)
BUN: 10 mg/dL (ref 6–24)
CO2: 23 mmol/L (ref 20–29)
CREATININE: 0.78 mg/dL (ref 0.57–1.00)
Calcium: 9.4 mg/dL (ref 8.7–10.2)
Chloride: 102 mmol/L (ref 96–106)
GFR calc Af Amer: 100 mL/min/{1.73_m2} (ref >59)
GFR, EST NON AFRICAN AMERICAN: 86 mL/min/{1.73_m2} (ref >59)
GLOBULIN, TOTAL: 2.9 g/dL (ref 1.5–4.5)
Glucose: 89 mg/dL (ref 65–99)
POTASSIUM: 4.1 mmol/L (ref 3.5–5.2)
SODIUM: 141 mmol/L (ref 134–144)
Total Protein: 7.2 g/dL (ref 6.0–8.5)

## 2019-02-10 LAB — CBC WITH DIFFERENTIAL/PLATELET
BASOS: 1 %
Basophils Absolute: 0 10*3/uL (ref 0.0–0.2)
EOS (ABSOLUTE): 0.7 10*3/uL — ABNORMAL HIGH (ref 0.0–0.4)
EOS: 13 %
HEMATOCRIT: 39.1 % (ref 34.0–46.6)
Hemoglobin: 13.5 g/dL (ref 11.1–15.9)
Immature Grans (Abs): 0 10*3/uL (ref 0.0–0.1)
Immature Granulocytes: 0 %
LYMPHS ABS: 1.6 10*3/uL (ref 0.7–3.1)
Lymphs: 33 %
MCH: 31.4 pg (ref 26.6–33.0)
MCHC: 34.5 g/dL (ref 31.5–35.7)
MCV: 91 fL (ref 79–97)
MONOS ABS: 0.7 10*3/uL (ref 0.1–0.9)
Monocytes: 14 %
NEUTROS ABS: 2 10*3/uL (ref 1.4–7.0)
Neutrophils: 39 %
Platelets: 252 10*3/uL (ref 150–450)
RBC: 4.3 x10E6/uL (ref 3.77–5.28)
RDW: 11.9 % (ref 11.7–15.4)
WBC: 5 10*3/uL (ref 3.4–10.8)

## 2019-02-10 NOTE — Assessment & Plan Note (Signed)
R/o esophageal issue  Esophogram ordered

## 2019-02-10 NOTE — Assessment & Plan Note (Addendum)
Dyspnea , concerned is due to ongoing dysphagia. Chest exam is normal.  CT Angio 2018 of chest was normal. SpO2 normal  Non smoker.  No exam evidence of airway obstruction  Note CMET and CBC done today was NORMAL  Needs:  Repeat CXR Esophogram to r/o stricture.

## 2019-02-14 ENCOUNTER — Ambulatory Visit (HOSPITAL_COMMUNITY)
Admission: RE | Admit: 2019-02-14 | Discharge: 2019-02-14 | Disposition: A | Discharge status: Home / Self Care | Payer: Self-pay | Source: Ambulatory Visit | Attending: Critical Care Medicine | Admitting: Critical Care Medicine

## 2019-02-14 ENCOUNTER — Other Ambulatory Visit: Payer: Self-pay | Admitting: Critical Care Medicine

## 2019-02-14 DIAGNOSIS — R1319 Other dysphagia: Secondary | ICD-10-CM

## 2019-02-14 DIAGNOSIS — R0602 Shortness of breath: Secondary | ICD-10-CM | POA: Insufficient documentation

## 2019-02-14 DIAGNOSIS — K219 Gastro-esophageal reflux disease without esophagitis: Secondary | ICD-10-CM | POA: Insufficient documentation

## 2019-02-14 DIAGNOSIS — K449 Diaphragmatic hernia without obstruction or gangrene: Secondary | ICD-10-CM

## 2019-02-14 DIAGNOSIS — R1310 Dysphagia, unspecified: Secondary | ICD-10-CM | POA: Insufficient documentation

## 2019-02-14 DIAGNOSIS — K224 Dyskinesia of esophagus: Secondary | ICD-10-CM | POA: Insufficient documentation

## 2019-02-14 HISTORY — DX: Diaphragmatic hernia without obstruction or gangrene: K44.9

## 2019-02-14 HISTORY — DX: Gastro-esophageal reflux disease without esophagitis: K21.9

## 2019-02-15 ENCOUNTER — Telehealth: Payer: Self-pay | Admitting: Critical Care Medicine

## 2019-02-15 DIAGNOSIS — R1310 Dysphagia, unspecified: Secondary | ICD-10-CM

## 2019-02-15 DIAGNOSIS — K224 Dyskinesia of esophagus: Secondary | ICD-10-CM

## 2019-02-15 DIAGNOSIS — R1319 Other dysphagia: Secondary | ICD-10-CM

## 2019-02-15 NOTE — Telephone Encounter (Signed)
Esophagram shows esophageal dysmotility  Pt needs GI consult   She has orange card  Daughter is aware of results

## 2019-02-18 ENCOUNTER — Ambulatory Visit: Payer: Self-pay | Attending: Family Medicine

## 2019-02-18 ENCOUNTER — Other Ambulatory Visit: Payer: Self-pay

## 2019-03-09 ENCOUNTER — Other Ambulatory Visit: Payer: Self-pay

## 2019-03-09 ENCOUNTER — Ambulatory Visit: Payer: Self-pay | Attending: Critical Care Medicine | Admitting: Critical Care Medicine

## 2019-03-09 ENCOUNTER — Encounter: Payer: Self-pay | Admitting: Critical Care Medicine

## 2019-03-09 DIAGNOSIS — R1310 Dysphagia, unspecified: Secondary | ICD-10-CM

## 2019-03-09 LAB — FECAL OCCULT BLOOD, IMMUNOCHEMICAL

## 2019-03-09 NOTE — Progress Notes (Deleted)
Subjective:    Patient ID: Amy Flowers, female    DOB: 1966/03/24, 53 y.o.   MRN: [id number]  53 y.o. F Arabic here for cough and dyspnea and to re establish care  Never smoker only hx of Vit D def.  Last Here 2018  She notes several months of difficulty swallowing and only is dyspneic during /after meals.  No real GERD symptoms.   Food does stick in her mid chest area.  No prior GI hx.   Has been in [place] several years from [place]. No weight loss  Needs Flu vaccine and colon/mammogram   See shortness of breath section below   03/09/2019 Tele visit At last ov: Dyspnea Dyspnea , concerned is due to ongoing dysphagia. Chest exam is normal.  CT Angio 2018 of chest was normal. SpO2 normal  Non smoker.  No exam evidence of airway obstruction  Note CMET and CBC done today was NORMAL  Needs:  Repeat CXR Esophogram to r/o stricture.    Dysphagia R/o esophageal issue  Esophogram ordered    [place] was seen today for follow-up.  Diagnoses and all orders for this visit:  Breast cancer screening -     MM DIGITAL SCREENING BILATERAL; Future  Esophageal dysphagia -     DG ESOPHAGUS W SINGLE CM (SOL OR THIN BA); Future -     Comprehensive metabolic panel -     CBC with Differential/Platelet -     DG Chest 2 View; Future  Shortness of breath -     Comprehensive metabolic panel -     CBC with Differential/Platelet -     DG Chest 2 View; Future  Colon cancer screening -     Fecal occult blood, imunochemical   Colon cancer screen with fecal occult blood card Flu vaccine offered, will return as we are out of flu vaccines Mammogram ordered   Virtual Visit via Telephone Note  I connected with Amy Flowers on 03/09/19 at  9:00 AM EDT by telephone and verified that I am speaking with the correct person using two identifiers.   I discussed the limitations, risks, security and privacy concerns of performing an evaluation and management service by telephone and the availability of in  person appointments. I also discussed with the patient that there may be a patient responsible charge related to this service. The patient expressed understanding and agreed to proceed.   History of Present Illness:    Observations/Objective:   Assessment and Plan:   Follow Up Instructions:    I discussed the assessment and treatment plan with the patient. The patient was provided an opportunity to ask questions and all were answered. The patient agreed with the plan and demonstrated an understanding of the instructions.   The patient was advised to call back or seek an in-person evaluation if the symptoms worsen or if the condition fails to improve as anticipated.  I provided *** minutes of non-face-to-face time during this encounter.   Shan Levans, MD    Shortness of Breath  This is a recurrent problem. The current episode started more than 1 month ago. The problem occurs intermittently (comes on with meals). Associated symptoms include chest pain, headaches and sputum production. Pertinent negatives include no ear pain, fever, hemoptysis, leg pain, leg swelling, orthopnea, PND, rhinorrhea, sore throat, vomiting or wheezing. The symptoms are aggravated by eating. Associated symptoms comments: Chest tightness. The patient has no known risk factors for DVT/PE. She has tried nothing for the symptoms. There is  no history of allergies, asthma, CAD, COPD, DVT, a heart failure, PE or pneumonia.     Past Medical History:  Diagnosis Date   Vitamin D deficiency 09/28/2017     No family history on file.   Social History   Socioeconomic History   Marital status: Single    Spouse name: Not on file   Number of children: Not on file   Years of education: Not on file   Highest education level: Not on file  Occupational History   Not on file  Social Needs   Financial resource strain: Not on file   Food insecurity:    Worry: Not on file    Inability: Not on file    Transportation needs:    Medical: Not on file    Non-medical: Not on file  Tobacco Use   Smoking status: Never Smoker   Smokeless tobacco: Never Used  Substance and Sexual Activity   Alcohol use: No   Drug use: No   Sexual activity: Not on file  Lifestyle   Physical activity:    Days per week: Not on file    Minutes per session: Not on file   Stress: Not on file  Relationships   Social connections:    Talks on phone: Not on file    Gets together: Not on file    Attends religious service: Not on file    Active member of club or organization: Not on file    Attends meetings of clubs or organizations: Not on file    Relationship status: Not on file   Intimate partner violence:    Fear of current or ex partner: Not on file    Emotionally abused: Not on file    Physically abused: Not on file    Forced sexual activity: Not on file  Other Topics Concern   Not on file  Social History Narrative   Not on file     No Known Allergies   Outpatient Medications Prior to Visit  Medication Sig Dispense Refill   aspirin EC 81 MG tablet Take 1 tablet (81 mg total) by mouth daily. 90 tablet 3   meloxicam (MOBIC) 7.5 MG tablet Take 1 tablet (7.5 mg total) by mouth daily. 30 tablet 2   Vitamin D, Ergocalciferol, (DRISDOL) 50000 units CAPS capsule Take 1 capsule (50,000 Units total) by mouth every 7 (seven) days. 16 capsule 0   No facility-administered medications prior to visit.     Review of Systems  Constitutional: Negative for appetite change and fever.  HENT: Positive for trouble swallowing. Negative for ear pain, postnasal drip, rhinorrhea, sinus pressure, sinus pain, sneezing and sore throat.        Choking with food  Respiratory: Positive for sputum production, choking, chest tightness and shortness of breath. Negative for cough, hemoptysis and wheezing.   Cardiovascular: Positive for chest pain. Negative for orthopnea, leg swelling and PND.  Gastrointestinal:  Negative for vomiting.       No GERD  Neurological: Positive for headaches.       Objective:   Physical Exam There were no vitals filed for this visit.  Gen: Pleasant, well-nourished, in no distress,  normal affect  ENT: No lesions,  mouth clear,  oropharynx clear, no postnasal drip  Neck: No JVD, no TMG, no carotid bruits  Lungs: No use of accessory muscles, no dullness to percussion, clear without rales or rhonchi  Cardiovascular: RRR, heart sounds normal, no murmur or gallops, no peripheral edema  Abdomen: soft and NT, no HSM,  BS normal  Musculoskeletal: No deformities, no cyanosis or clubbing  Neuro: alert, non focal  Skin: Warm, no lesions or rashes  No results found. BMP Latest Ref Rng & Units 02/09/2019 06/26/2017  Glucose 65 - 99 mg/dL 89 244(W)  BUN 6 - 24 mg/dL 10 7  Creatinine 1.02 - 1.00 mg/dL 7.25 3.66  BUN/Creat Ratio 9 - 23 13 -  Sodium 134 - 144 mmol/L 141 138  Potassium 3.5 - 5.2 mmol/L 4.1 3.8  Chloride 96 - 106 mmol/L 102 103  CO2 20 - 29 mmol/L 23 27  Calcium 8.7 - 10.2 mg/dL 9.4 9.5   CBC Latest Ref Rng & Units 02/09/2019 06/26/2017  WBC 3.4 - 10.8 x10E3/uL 5.0 5.5  Hemoglobin 11.1 - 15.9 g/dL 44.0 34.7  Hematocrit 42.5 - 46.6 % 39.1 40.5  Platelets 150 - 450 x10E3/uL 252 213   Wt Readings from Last 3 Encounters:  02/09/19 162 lb 3.2 oz (73.6 kg)  09/28/17 151 lb (68.5 kg)  08/13/17 151 lb 12.8 oz (68.9 kg)       Assessment & Plan:  I personally reviewed all images and lab data in the [facility] system as well as any outside material available during this office visit and agree with the  radiology impressions.   No problem-specific Assessment & Plan notes found for this encounter.   There are no diagnoses linked to this encounter. Colon cancer screen with fecal occult blood card Flu vaccine offered, will return as we are out of flu vaccines Mammogram ordered

## 2022-01-09 ENCOUNTER — Other Ambulatory Visit: Payer: Self-pay

## 2022-01-09 ENCOUNTER — Ambulatory Visit (INDEPENDENT_AMBULATORY_CARE_PROVIDER_SITE_OTHER): Payer: Self-pay

## 2022-01-09 ENCOUNTER — Encounter (HOSPITAL_COMMUNITY): Payer: Self-pay | Admitting: Emergency Medicine

## 2022-01-09 ENCOUNTER — Ambulatory Visit (HOSPITAL_COMMUNITY)
Admission: EM | Admit: 2022-01-09 | Discharge: 2022-01-09 | Disposition: A | Discharge status: Home / Self Care | Payer: Self-pay | Attending: Emergency Medicine | Admitting: Emergency Medicine

## 2022-01-09 DIAGNOSIS — M5410 Radiculopathy, site unspecified: Secondary | ICD-10-CM

## 2022-01-09 DIAGNOSIS — M542 Cervicalgia: Secondary | ICD-10-CM

## 2022-01-09 MED ORDER — BACLOFEN 10 MG PO TABS: 10.0000 mg | ORAL_TABLET | Freq: Every day | ORAL | 0 refills | Status: AC

## 2022-01-09 MED ORDER — METHYLPREDNISOLONE 4 MG PO TBPK: ORAL_TABLET | ORAL | 0 refills | Status: DC

## 2022-01-09 NOTE — ED Triage Notes (Signed)
Pt c/o lt sided chest/shoulder/arm pain for a month. C/o lt sided head and face numbness off and on today. No facial droop noted. No drift noted.

## 2022-01-09 NOTE — ED Provider Notes (Signed)
[facility]    CSN: [id number] Arrival date & time: 01/09/22  1829    HISTORY   Chief Complaint  Patient presents with   Chest Pain   Numbness   HPI Amy Flowers is a 56 y.o. female. Patient complains of pain in the left side of her chest, left shoulder and her arm for about a month.  Patient also complains of left-sided head and facial numbness which has occurred on and off today only.  EKG performed on arrival during triage revealed normal sinus rhythm with no abnormalities appreciated.  Patient states she requires a note to return to work.  The history is provided by the patient.  Past Medical History:  Diagnosis Date   Aortic atherosclerosis (HCC) 06/26/2017   GERD (gastroesophageal reflux disease) 02/14/2019   Hiatal hernia 02/14/2019   Vitamin D deficiency 09/28/2017   Patient Active Problem List   Diagnosis Date Noted   Esophageal motility disorder 02/14/2019   GERD (gastroesophageal reflux disease) 02/14/2019   Hiatal hernia 02/14/2019   Dysphagia 02/09/2019   Dyspnea 02/09/2019   Vitamin D deficiency 09/28/2017   Osteoarthritis of both knees 07/01/2017   Aortic atherosclerosis (HCC) 06/26/2017    Class: Diagnosis of   Past Surgical History:  Procedure Laterality Date   APPENDECTOMY     OB History   No obstetric history on file.    Home Medications    Prior to Admission medications   Medication Sig Start Date End Date Taking? Authorizing Provider  aspirin EC 81 MG tablet Take 1 tablet (81 mg total) by mouth daily. 07/01/17   Anders SimmondsMcClung, Angela M, PA-C  meloxicam (MOBIC) 7.5 MG tablet Take 1 tablet (7.5 mg total) by mouth daily. 08/13/17   Hoy RegisterNewlin, Enobong, MD  Vitamin D, Ergocalciferol, (DRISDOL) 50000 units CAPS capsule Take 1 capsule (50,000 Units total) by mouth every 7 (seven) days. 09/29/17   Hoy RegisterNewlin, Enobong, MD    Family History History reviewed. No pertinent family history. Social History Social History   Tobacco Use   Smoking status: Never    Smokeless tobacco: Never  Vaping Use   Vaping Use: Never used  Substance Use Topics   Alcohol use: No   Drug use: No   Allergies   Patient has no known allergies.  Review of Systems Review of Systems Pertinent findings noted in history of present illness.   Physical Exam Triage Vital Signs ED Triage Vitals  Enc Vitals Group     BP 10/04/21 0827 (!) 147/82     Pulse Rate 10/04/21 0827 72     Resp 10/04/21 0827 18     Temp 10/04/21 0827 98.3 F (36.8 C)     Temp Source 10/04/21 0827 Oral     SpO2 10/04/21 0827 98 %     Weight --      Height --      Head Circumference --      Peak Flow --      Pain Score 10/04/21 0826 5     Pain Loc --      Pain Edu? --      Excl. in GC? --   No data found.  Updated Vital Signs BP 138/73 (BP Location: Right Arm)    Pulse 93    Temp 98.3 F (36.8 C) (Oral)    Resp 18    SpO2 100%   Physical Exam Vitals and nursing note reviewed.  Constitutional:      General: She is not in acute distress.  Appearance: Normal appearance. She is not ill-appearing.  HENT:     Head: Normocephalic and atraumatic.  Eyes:     General: Lids are normal.        Right eye: No discharge.        Left eye: No discharge.     Extraocular Movements: Extraocular movements intact.     Conjunctiva/sclera: Conjunctivae normal.     Right eye: Right conjunctiva is not injected.     Left eye: Left conjunctiva is not injected.  Neck:     Trachea: Trachea and phonation normal.  Cardiovascular:     Rate and Rhythm: Normal rate and regular rhythm.     Pulses: Normal pulses.     Heart sounds: Normal heart sounds. No murmur heard.   No friction rub. No gallop.  Pulmonary:     Effort: Pulmonary effort is normal. No accessory muscle usage, prolonged expiration or respiratory distress.     Breath sounds: Normal breath sounds. No stridor, decreased air movement or transmitted upper airway sounds. No decreased breath sounds, wheezing, rhonchi or rales.  Chest:     Chest  wall: No tenderness.  Musculoskeletal:     Cervical back: Neck supple. Tenderness, bony tenderness and crepitus present. Pain with movement present. Decreased range of motion.  Lymphadenopathy:     Cervical: No cervical adenopathy.  Skin:    General: Skin is warm and dry.     Findings: No erythema or rash.  Neurological:     General: No focal deficit present.     Mental Status: She is alert and oriented to person, place, and time.  Psychiatric:        Mood and Affect: Mood normal.        Behavior: Behavior normal.    Visual Acuity Right Eye Distance:   Left Eye Distance:   Bilateral Distance:    Right Eye Near:   Left Eye Near:    Bilateral Near:     UC Couse / Diagnostics / Procedures:    EKG  Radiology DG Cervical Spine Complete  Result Date: 01/09/2022 CLINICAL DATA:  Left-sided head and face numbness off and on, no facial droop EXAM: CERVICAL SPINE - COMPLETE 4+ VIEW COMPARISON:  None. FINDINGS: There is no evidence of cervical spine fracture or prevertebral soft tissue swelling. Straightening of the normal cervical lordosis. Right-greater-than-left uncovertebral and facet arthropathy. No other significant bone abnormalities are identified. IMPRESSION: Negative cervical spine radiographs. Electronically Signed   By: Merilyn Baba M.D.   On: 01/09/2022 19:58    Procedures Procedures (including critical care time)  UC Diagnoses / Final Clinical Impressions(s)   I have reviewed the triage vital signs and the nursing notes.  Pertinent labs & imaging results that were available during my care of the patient were reviewed by me and considered in my medical decision making (see chart for details).    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  Patient provided with Medrol Dosepak to reduce inflammation and muscle relaxers to call muscle spasms and  cervical paraspinous muscles.  Patient advised to follow-up with primary care for possible referral to spine specialist and further treatment of her pain.  Note provided for work.  ED Prescriptions     Medication Sig Dispense Auth. Provider   methylPREDNISolone (MEDROL DOSEPAK) 4 MG TBPK tablet Take 24 mg on day 1, 20 mg on day 2, 16 mg on day  3, 12 mg on day 4, 8 mg on day 5, 4 mg on day 6. 21 tablet Lynden Oxford Scales, PA-C   baclofen (LIORESAL) 10 MG tablet Take 1 tablet (10 mg total) by mouth at bedtime for 7 days. 7 tablet Lynden Oxford Scales, PA-C      PDMP not reviewed this encounter.  Pending results:  Labs Reviewed - No data to display  Medications Ordered in UC: Medications - No data to display  Disposition Upon Discharge:  Condition: stable for discharge home Home: take medications as prescribed; routine discharge instructions as discussed; follow up as advised.  Patient presented with an acute illness with associated systemic symptoms and significant discomfort requiring urgent management. In my opinion, this is a condition that a prudent lay person (someone who possesses an average knowledge of health and medicine) may potentially expect to result in complications if not addressed urgently such as respiratory distress, impairment of bodily function or dysfunction of bodily organs.   Routine symptom specific, illness specific and/or disease specific instructions were discussed with the patient and/or caregiver at length.   As such, the patient has been evaluated and assessed, work-up was performed and treatment was provided in alignment with urgent care protocols and evidence based medicine.  Patient/parent/caregiver has been advised that the patient may require follow up for further testing and treatment if the symptoms continue in spite of treatment, as clinically indicated and appropriate.  If the patient was tested for COVID-19, Influenza and/or RSV, then the  patient/parent/guardian was advised to isolate at home pending the results of his/her diagnostic coronavirus test and potentially longer if theyre positive. I have also advised pt that if his/her COVID-19 test returns positive, it's recommended to self-isolate for at least 10 days after symptoms first appeared AND until fever-free for 24 hours without fever reducer AND other symptoms have improved or resolved. Discussed self-isolation recommendations as well as instructions for household member/close contacts as per the [facility] and [place] [facility], and also gave patient the [place] packet with this information.  Patient/parent/caregiver has been advised to return to the [facility] or PCP in 3-5 days if no better; to PCP or the Emergency Department if new signs and symptoms develop, or if the current signs or symptoms continue to change or worsen for further workup, evaluation and treatment as clinically indicated and appropriate  The patient will follow up with their current PCP if and as advised. If the patient does not currently have a PCP we will assist them in obtaining one.   The patient may need specialty follow up if the symptoms continue, in spite of conservative treatment and management, for further workup, evaluation, consultation and treatment as clinically indicated and appropriate.   Patient/parent/caregiver verbalized understanding and agreement of plan as discussed.  All questions were addressed during visit.  Please see discharge instructions below for further details of plan.  Discharge Instructions:   Discharge Instructions      Your neck x-ray was concerning for extensive degenerative bone and disc disease.  Recommend that you follow-up your primary care provider to discuss referral to spine specialist for further evaluation and possible treatment.  For inflammatory pain relief, please begin Medrol Dosepak, please take 1 regular tablets every day in the morning with your first meal of the day.   Please also take muscle relaxer called baclofen at bedtime.  Please follow-up with your primary care provider in 7 days if you have not had improvement of your symptoms.  This office note has been dictated using Museum/gallery curator.  Unfortunately, and despite my best efforts, this method of dictation can sometimes lead to occasional typographical or grammatical errors.  I apologize in advance if this occurs.     Lynden Oxford Scales, PA-C 01/09/22 2003

## 2022-01-09 NOTE — Discharge Instructions (Addendum)
Your neck x-ray was concerning for extensive degenerative bone and disc disease.  Recommend that you follow-up your primary care provider to discuss referral to spine specialist for further evaluation and possible treatment.  For inflammatory pain relief, please begin Medrol Dosepak, please take 1 regular tablets every day in the morning with your first meal of the day.  Please also take muscle relaxer called baclofen at bedtime.  Please follow-up with your primary care provider in 7 days if you have not had improvement of your symptoms.

## 2022-01-15 ENCOUNTER — Ambulatory Visit: Payer: Self-pay

## 2022-02-25 ENCOUNTER — Encounter: Payer: Self-pay | Admitting: Family Medicine

## 2022-02-25 ENCOUNTER — Ambulatory Visit: Payer: Self-pay | Attending: Family Medicine | Admitting: Family Medicine

## 2022-02-25 ENCOUNTER — Other Ambulatory Visit: Payer: Self-pay

## 2022-02-25 VITALS — BP 121/79 | HR 73 | Ht 61.0 in | Wt 155.8 lb

## 2022-02-25 DIAGNOSIS — Z1231 Encounter for screening mammogram for malignant neoplasm of breast: Secondary | ICD-10-CM

## 2022-02-25 DIAGNOSIS — Z13228 Encounter for screening for other metabolic disorders: Secondary | ICD-10-CM

## 2022-02-25 DIAGNOSIS — Z1211 Encounter for screening for malignant neoplasm of colon: Secondary | ICD-10-CM

## 2022-02-25 DIAGNOSIS — Z0000 Encounter for general adult medical examination without abnormal findings: Secondary | ICD-10-CM

## 2022-02-25 NOTE — Patient Instructions (Signed)

## 2022-02-25 NOTE — Progress Notes (Signed)
Physical no pap ?

## 2022-02-25 NOTE — Progress Notes (Signed)
? ?Subjective:  ?Patient ID: Amy Flowers, female    DOB: 1966/03/15  Age: 56 y.o. MRN: [id number] ? ?CC: Annual Exam ? ? ?HPI ?Amy Flowers is a 56 y.o. year old female who presents today for an annual physical exam.  She is accompanied by her daughter who assist with the interpretation. ? ? ?Interval History: ?She is due for her breast cancer screening, colon cancer screening and cervical cancer screening. ?Last Pap smear was in 09/2017, she declines Pap smear today ?She has not had a mammogram or colonoscopy in the past. ?Denies acute concerns today. ? ?Past Medical History:  ?Diagnosis Date  ? Aortic atherosclerosis (HCC) 06/26/2017  ? GERD (gastroesophageal reflux disease) 02/14/2019  ? Hiatal hernia 02/14/2019  ? Vitamin D deficiency 09/28/2017  ? ? ?Past Surgical History:  ?Procedure Laterality Date  ? APPENDECTOMY    ? ? ?History reviewed. No pertinent family history. ? ?Social History  ? ?Socioeconomic History  ? Marital status: Single  ?  Spouse name: Not on file  ? Number of children: Not on file  ? Years of education: Not on file  ? Highest education level: Not on file  ?Occupational History  ? Not on file  ?Tobacco Use  ? Smoking status: Never  ? Smokeless tobacco: Never  ?Vaping Use  ? Vaping Use: Never used  ?Substance and Sexual Activity  ? Alcohol use: No  ? Drug use: No  ? Sexual activity: Not on file  ?Other Topics Concern  ? Not on file  ?Social History Narrative  ? Not on file  ? ?Social Determinants of Health  ? ?Financial Resource Strain: Not on file  ?Food Insecurity: Not on file  ?Transportation Needs: Not on file  ?Physical Activity: Not on file  ?Stress: Not on file  ?Social Connections: Not on file  ? ? ?No Known Allergies ? ?Outpatient Medications Prior to Visit  ?Medication Sig Dispense Refill  ? aspirin EC 81 MG tablet Take 1 tablet (81 mg total) by mouth daily. (Patient not taking: Reported on 02/25/2022) 90 tablet 3  ? meloxicam (MOBIC) 7.5 MG tablet Take 1 tablet (7.5 mg total) by mouth daily.  (Patient not taking: Reported on 02/25/2022) 30 tablet 2  ? methylPREDNISolone (MEDROL DOSEPAK) 4 MG TBPK tablet Take 24 mg on day 1, 20 mg on day 2, 16 mg on day 3, 12 mg on day 4, 8 mg on day 5, 4 mg on day 6. (Patient not taking: Reported on 02/25/2022) 21 tablet 0  ? Vitamin D, Ergocalciferol, (DRISDOL) 50000 units CAPS capsule Take 1 capsule (50,000 Units total) by mouth every 7 (seven) days. (Patient not taking: Reported on 02/25/2022) 16 capsule 0  ? ?No facility-administered medications prior to visit.  ? ? ? ?ROS ?Review of Systems  ?Constitutional:  Negative for activity change, appetite change and fatigue.  ?HENT:  Negative for congestion, sinus pressure and sore throat.   ?Eyes:  Negative for visual disturbance.  ?Respiratory:  Negative for cough, chest tightness, shortness of breath and wheezing.   ?Cardiovascular:  Negative for chest pain and palpitations.  ?Gastrointestinal:  Negative for abdominal distention, abdominal pain and constipation.  ?Endocrine: Negative for polydipsia.  ?Genitourinary:  Negative for dysuria and frequency.  ?Musculoskeletal:  Negative for arthralgias and back pain.  ?Skin:  Negative for rash.  ?Neurological:  Negative for tremors, light-headedness and numbness.  ?Hematological:  Does not bruise/bleed easily.  ?Psychiatric/Behavioral:  Negative for agitation and behavioral problems.   ? ?Objective:  ?BP 121/79  Pulse 73   Ht 5\' 1"  (1.549 m)   Wt 155 lb 12.8 oz (70.7 kg)   SpO2 100%   BMI 29.44 kg/m?  ? ?BP/Weight 02/25/2022 01/09/2022 02/09/2019  ?Systolic BP 121 138 118  ?Diastolic BP 79 73 74  ?Wt. (Lbs) 155.8 - 162.2  ?BMI 29.44 - 29.67  ? ? ? ? ?Physical Exam ?Constitutional:   ?   General: She is not in acute distress. ?   Appearance: She is well-developed. She is not diaphoretic.  ?HENT:  ?   Head: Normocephalic.  ?   Right Ear: External ear normal.  ?   Left Ear: External ear normal.  ?   Nose: Nose normal.  ?   Mouth/Throat:  ?   Mouth: Mucous membranes are moist.   ?Eyes:  ?   Extraocular Movements: Extraocular movements intact.  ?   Conjunctiva/sclera: Conjunctivae normal.  ?   Pupils: Pupils are equal, round, and reactive to light.  ?Neck:  ?   Vascular: No JVD.  ?Cardiovascular:  ?   Rate and Rhythm: Normal rate and regular rhythm.  ?   Pulses: Normal pulses.  ?   Heart sounds: Normal heart sounds. No murmur heard. ?  No gallop.  ?Pulmonary:  ?   Effort: Pulmonary effort is normal. No respiratory distress.  ?   Breath sounds: Normal breath sounds. No wheezing or rales.  ?Chest:  ?   Chest wall: No tenderness.  ?Abdominal:  ?   General: Bowel sounds are normal. There is no distension.  ?   Palpations: Abdomen is soft. There is no mass.  ?   Tenderness: There is no abdominal tenderness.  ?Musculoskeletal:     ?   General: No tenderness. Normal range of motion.  ?   Cervical back: Normal range of motion.  ?Skin: ?   General: Skin is warm and dry.  ?Neurological:  ?   Mental Status: She is alert and oriented to person, place, and time.  ?   Deep Tendon Reflexes: Reflexes are normal and symmetric.  ?Psychiatric:     ?   Mood and Affect: Mood normal.  ? ? ?CMP Latest Ref Rng & Units 02/09/2019 06/26/2017  ?Glucose 65 - 99 mg/dL 89 295(A)  ?BUN 6 - 24 mg/dL 10 7  ?Creatinine 0.57 - 1.00 mg/dL 2.13 0.86  ?Sodium 134 - 144 mmol/L 141 138  ?Potassium 3.5 - 5.2 mmol/L 4.1 3.8  ?Chloride 96 - 106 mmol/L 102 103  ?CO2 20 - 29 mmol/L 23 27  ?Calcium 8.7 - 10.2 mg/dL 9.4 9.5  ?Total Protein 6.0 - 8.5 g/dL 7.2 -  ?Total Bilirubin 0.0 - 1.2 mg/dL 0.2 -  ?Alkaline Phos 39 - 117 IU/L 61 -  ?AST 0 - 40 IU/L 17 -  ?ALT 0 - 32 IU/L 15 -  ? ? ?Lipid Panel  ?No results found for: CHOL, TRIG, HDL, CHOLHDL, VLDL, LDLCALC, LDLDIRECT ? ?CBC ?   ?Component Value Date/Time  ? WBC 5.0 02/09/2019 1227  ? WBC 5.5 06/26/2017 2004  ? RBC 4.30 02/09/2019 1227  ? RBC 4.53 06/26/2017 2004  ? HGB 13.5 02/09/2019 1227  ? HCT 39.1 02/09/2019 1227  ? PLT 252 02/09/2019 1227  ? MCV 91 02/09/2019 1227  ? MCH 31.4  02/09/2019 1227  ? MCH 29.6 06/26/2017 2004  ? MCHC 34.5 02/09/2019 1227  ? MCHC 33.1 06/26/2017 2004  ? RDW 11.9 02/09/2019 1227  ? LYMPHSABS 1.6 02/09/2019 1227  ? EOSABS 0.7 (H)  02/09/2019 1227  ? BASOSABS 0.0 02/09/2019 1227  ? ? ?No results found for: HGBA1C ? ?Assessment & Plan:  ?1. Annual physical exam ?Counseled on 150 minutes of exercise per week, healthy eating (including decreased daily intake of saturated fats, cholesterol, added sugars, sodium), routine healthcare maintenance. ? ?- LP+Non-HDL Cholesterol; Future ?- CMP14+EGFR; Future ?- CBC with Differential/Platelet; Future ? ?2. Screening for metabolic disorder ?- T4, free; Future ?- TSH; Future ?- Hemoglobin A1c; Future ? ?3. Encounter for screening mammogram for malignant neoplasm of breast ?- MM 3D SCREEN BREAST BILATERAL; Future ? ?4. Screening for colon cancer ?- Fecal occult blood, imunochemical([facility]) ? ? ?Health Care Maintenance: Counseled on need for Pap smear but she declines ?No orders of the defined types were placed in this encounter. ? ? ?Follow-up: Return in about 1 year (around 02/26/2023) for Chronic medical conditions.  ? ? ? ? ? ?Hoy Register, MD, FAAFP. ?[facility] ?[place], [place] ?[phone]   ?02/25/2022, 12:08 PM ?

## 2022-02-26 ENCOUNTER — Ambulatory Visit: Payer: Self-pay | Attending: Family Medicine

## 2022-02-26 DIAGNOSIS — Z13228 Encounter for screening for other metabolic disorders: Secondary | ICD-10-CM

## 2022-02-26 DIAGNOSIS — Z0000 Encounter for general adult medical examination without abnormal findings: Secondary | ICD-10-CM

## 2022-02-27 LAB — CMP14+EGFR
ALT: 46 IU/L — ABNORMAL HIGH (ref 0–32)
AST: 38 IU/L (ref 0–40)
Albumin/Globulin Ratio: 1.6 (ref 1.2–2.2)
Albumin: 4.2 g/dL (ref 3.8–4.9)
Alkaline Phosphatase: 80 IU/L (ref 44–121)
BUN/Creatinine Ratio: 11 (ref 9–23)
BUN: 9 mg/dL (ref 6–24)
Bilirubin Total: 0.3 mg/dL (ref 0.0–1.2)
CO2: 25 mmol/L (ref 20–29)
Calcium: 9.5 mg/dL (ref 8.7–10.2)
Chloride: 104 mmol/L (ref 96–106)
Creatinine, Ser: 0.79 mg/dL (ref 0.57–1.00)
Globulin, Total: 2.7 g/dL (ref 1.5–4.5)
Glucose: 99 mg/dL (ref 70–99)
Potassium: 4.4 mmol/L (ref 3.5–5.2)
Sodium: 142 mmol/L (ref 134–144)
Total Protein: 6.9 g/dL (ref 6.0–8.5)
eGFR: 87 mL/min/{1.73_m2} (ref >59)

## 2022-02-27 LAB — CBC WITH DIFFERENTIAL/PLATELET
Basophils Absolute: 0 10*3/uL (ref 0.0–0.2)
Basos: 1 %
EOS (ABSOLUTE): 0.2 10*3/uL (ref 0.0–0.4)
Eos: 6 %
Hematocrit: 40.4 % (ref 34.0–46.6)
Hemoglobin: 14.1 g/dL (ref 11.1–15.9)
Immature Grans (Abs): 0 10*3/uL (ref 0.0–0.1)
Immature Granulocytes: 0 %
Lymphocytes Absolute: 1.8 10*3/uL (ref 0.7–3.1)
Lymphs: 46 %
MCH: 32.4 pg (ref 26.6–33.0)
MCHC: 34.9 g/dL (ref 31.5–35.7)
MCV: 93 fL (ref 79–97)
Monocytes Absolute: 0.6 10*3/uL (ref 0.1–0.9)
Monocytes: 15 %
Neutrophils Absolute: 1.3 10*3/uL — ABNORMAL LOW (ref 1.4–7.0)
Neutrophils: 32 %
Platelets: 187 10*3/uL (ref 150–450)
RBC: 4.35 x10E6/uL (ref 3.77–5.28)
RDW: 11.9 % (ref 11.7–15.4)
WBC: 3.9 10*3/uL (ref 3.4–10.8)

## 2022-02-27 LAB — LP+NON-HDL CHOLESTEROL
Cholesterol, Total: 162 mg/dL (ref 100–199)
HDL: 49 mg/dL (ref >39)
LDL Chol Calc (NIH): 99 mg/dL (ref 0–99)
Total Non-HDL-Chol (LDL+VLDL): 113 mg/dL (ref 0–129)
Triglycerides: 73 mg/dL (ref 0–149)
VLDL Cholesterol Cal: 14 mg/dL (ref 5–40)

## 2022-02-27 LAB — HEMOGLOBIN A1C
Est. average glucose Bld gHb Est-mCnc: 126 mg/dL
Hgb A1c MFr Bld: 6 % — ABNORMAL HIGH (ref 4.8–5.6)

## 2022-02-27 LAB — T4, FREE: Free T4: 1 ng/dL (ref 0.82–1.77)

## 2022-02-27 LAB — TSH: TSH: 1.82 u[IU]/mL (ref 0.450–4.500)

## 2022-06-20 ENCOUNTER — Emergency Department (HOSPITAL_COMMUNITY)
Admission: EM | Admit: 2022-06-20 | Discharge: 2022-06-20 | Disposition: A | Discharge status: Home / Self Care | Payer: Self-pay | Attending: Emergency Medicine | Admitting: Emergency Medicine

## 2022-06-20 ENCOUNTER — Encounter (HOSPITAL_COMMUNITY): Payer: Self-pay

## 2022-06-20 ENCOUNTER — Emergency Department (HOSPITAL_COMMUNITY): Payer: Self-pay

## 2022-06-20 DIAGNOSIS — Z20822 Contact with and (suspected) exposure to covid-19: Secondary | ICD-10-CM | POA: Insufficient documentation

## 2022-06-20 DIAGNOSIS — Z7982 Long term (current) use of aspirin: Secondary | ICD-10-CM | POA: Insufficient documentation

## 2022-06-20 DIAGNOSIS — R590 Localized enlarged lymph nodes: Secondary | ICD-10-CM | POA: Insufficient documentation

## 2022-06-20 DIAGNOSIS — R112 Nausea with vomiting, unspecified: Secondary | ICD-10-CM | POA: Insufficient documentation

## 2022-06-20 DIAGNOSIS — K449 Diaphragmatic hernia without obstruction or gangrene: Secondary | ICD-10-CM | POA: Insufficient documentation

## 2022-06-20 DIAGNOSIS — R197 Diarrhea, unspecified: Secondary | ICD-10-CM | POA: Insufficient documentation

## 2022-06-20 DIAGNOSIS — R1013 Epigastric pain: Secondary | ICD-10-CM | POA: Insufficient documentation

## 2022-06-20 LAB — COMPREHENSIVE METABOLIC PANEL
ALT: 34 U/L (ref 0–44)
AST: 35 U/L (ref 15–41)
Albumin: 3.3 g/dL — ABNORMAL LOW (ref 3.5–5.0)
Alkaline Phosphatase: 65 U/L (ref 38–126)
Anion gap: 10 (ref 5–15)
BUN: 6 mg/dL (ref 6–20)
CO2: 27 mmol/L (ref 22–32)
Calcium: 8.9 mg/dL (ref 8.9–10.3)
Chloride: 102 mmol/L (ref 98–111)
Creatinine, Ser: 0.84 mg/dL (ref 0.44–1.00)
GFR, Estimated: >60 mL/min (ref >60)
Glucose, Bld: 127 mg/dL — ABNORMAL HIGH (ref 70–99)
Potassium: 3.7 mmol/L (ref 3.5–5.1)
Sodium: 139 mmol/L (ref 135–145)
Total Bilirubin: 0.4 mg/dL (ref 0.3–1.2)
Total Protein: 6.6 g/dL (ref 6.5–8.1)

## 2022-06-20 LAB — CBC
HCT: 40.7 % (ref 36.0–46.0)
Hemoglobin: 13.9 g/dL (ref 12.0–15.0)
MCH: 31.5 pg (ref 26.0–34.0)
MCHC: 34.2 g/dL (ref 30.0–36.0)
MCV: 92.3 fL (ref 80.0–100.0)
Platelets: 191 10*3/uL (ref 150–400)
RBC: 4.41 MIL/uL (ref 3.87–5.11)
RDW: 12.2 % (ref 11.5–15.5)
WBC: 5.5 10*3/uL (ref 4.0–10.5)
nRBC: 0 % (ref 0.0–0.2)

## 2022-06-20 LAB — URINALYSIS, ROUTINE W REFLEX MICROSCOPIC
Bilirubin Urine: NEGATIVE
Glucose, UA: NEGATIVE mg/dL
Hgb urine dipstick: NEGATIVE
Ketones, ur: NEGATIVE mg/dL
Leukocytes,Ua: NEGATIVE
Nitrite: NEGATIVE
Protein, ur: NEGATIVE mg/dL
Specific Gravity, Urine: 1.01 (ref 1.005–1.030)
pH: 7.5 (ref 5.0–8.0)

## 2022-06-20 LAB — LIPASE, BLOOD: Lipase: 55 U/L — ABNORMAL HIGH (ref 11–51)

## 2022-06-20 LAB — SARS CORONAVIRUS 2 BY RT PCR: SARS Coronavirus 2 by RT PCR: NEGATIVE

## 2022-06-20 LAB — I-STAT BETA HCG BLOOD, ED (MC, WL, AP ONLY): I-stat hCG, quantitative: <5 m[IU]/mL (ref <5)

## 2022-06-20 MED ORDER — PANTOPRAZOLE SODIUM 40 MG PO TBEC: 40.0000 mg | DELAYED_RELEASE_TABLET | Freq: Every day | ORAL | 0 refills | Status: DC

## 2022-06-20 MED ORDER — IOHEXOL 350 MG/ML SOLN
100.0000 mL | Freq: Once | INTRAVENOUS | Status: AC | PRN
  Administered 2022-06-20: 100 mL via INTRAVENOUS

## 2022-06-20 MED ORDER — LOPERAMIDE HCL 2 MG PO CAPS: 2.0000 mg | ORAL_CAPSULE | Freq: Four times a day (QID) | ORAL | 0 refills | Status: DC | PRN

## 2022-06-20 MED ORDER — ONDANSETRON HCL 4 MG PO TABS: 4.0000 mg | ORAL_TABLET | Freq: Three times a day (TID) | ORAL | 0 refills | Status: DC | PRN

## 2022-06-20 MED ORDER — MORPHINE SULFATE (PF) 4 MG/ML IV SOLN
4.0000 mg | Freq: Once | INTRAVENOUS | Status: AC
  Administered 2022-06-20: 4 mg via INTRAVENOUS
  Filled 2022-06-20: qty 1

## 2022-06-20 MED ORDER — PANTOPRAZOLE SODIUM 40 MG IV SOLR
40.0000 mg | Freq: Once | INTRAVENOUS | Status: AC
  Administered 2022-06-20: 40 mg via INTRAVENOUS
  Filled 2022-06-20: qty 10

## 2022-06-20 MED ORDER — SODIUM CHLORIDE 0.9 % IV BOLUS
1000.0000 mL | Freq: Once | INTRAVENOUS | Status: AC
  Administered 2022-06-20: 1000 mL via INTRAVENOUS

## 2022-06-20 NOTE — ED Provider Notes (Signed)
[facility] EMERGENCY DEPARTMENT Provider Note   CSN: [id number] Arrival date & time: 06/20/22  0129     History  Chief Complaint  Patient presents with   Abdominal Pain    Amy Flowers is a 56 y.o. female.  She has no significant past medical history.  She speaks Arabic and her family member is translating at her request.  Here with 3 days of central abdominal pain associated with nausea vomiting and diarrhea.  No fevers or chills.  No obvious blood in the diarrhea or vomit.  She is not on blood thinners.  Rates the pain as 10 out of 10.  Has tried nothing for it.  No recent travel.  Other family members are sick with fevers but no diarrhea.  The history is provided by the patient and a relative. A language interpreter was used (family at request).  Abdominal Pain Pain location:  Epigastric Pain severity:  Severe Onset quality:  Gradual Duration:  3 days Timing:  Constant Progression:  Unchanged Chronicity:  New Context: not recent travel and not trauma   Relieved by:  None tried Worsened by:  Nothing Ineffective treatments:  None tried Associated symptoms: diarrhea, nausea and vomiting   Associated symptoms: no chest pain, no constipation, no cough, no dysuria, no fever, no hematemesis, no hematochezia, no hematuria and no shortness of breath        Home Medications Prior to Admission medications   Medication Sig Start Date End Date Taking? Authorizing Provider  aspirin EC 81 MG tablet Take 1 tablet (81 mg total) by mouth daily. Patient not taking: Reported on 02/25/2022 07/01/17   Anders Simmonds, PA-C  meloxicam (MOBIC) 7.5 MG tablet Take 1 tablet (7.5 mg total) by mouth daily. Patient not taking: Reported on 02/25/2022 08/13/17   Hoy Register, MD  methylPREDNISolone (MEDROL DOSEPAK) 4 MG TBPK tablet Take 24 mg on day 1, 20 mg on day 2, 16 mg on day 3, 12 mg on day 4, 8 mg on day 5, 4 mg on day 6. Patient not taking: Reported on 02/25/2022 01/09/22    Theadora Rama Scales, PA-C  Vitamin D, Ergocalciferol, (DRISDOL) 50000 units CAPS capsule Take 1 capsule (50,000 Units total) by mouth every 7 (seven) days. Patient not taking: Reported on 02/25/2022 09/29/17   Hoy Register, MD      Allergies    Patient has no known allergies.    Review of Systems   Review of Systems  Constitutional:  Negative for fever.  Respiratory:  Negative for cough and shortness of breath.   Cardiovascular:  Negative for chest pain.  Gastrointestinal:  Positive for abdominal pain, diarrhea, nausea and vomiting. Negative for constipation, hematemesis and hematochezia.  Genitourinary:  Negative for dysuria and hematuria.    Physical Exam Updated Vital Signs BP 125/73 (BP Location: Right Arm)   Pulse 68   Temp 98.1 F (36.7 C) (Oral)   Resp 15   SpO2 96%  Physical Exam Vitals and nursing note reviewed.  Constitutional:      General: She is not in acute distress.    Appearance: Normal appearance. She is well-developed.  HENT:     Head: Normocephalic and atraumatic.  Eyes:     Conjunctiva/sclera: Conjunctivae normal.  Cardiovascular:     Rate and Rhythm: Normal rate and regular rhythm.     Heart sounds: No murmur heard. Pulmonary:     Effort: Pulmonary effort is normal. No respiratory distress.     Breath sounds: Normal  breath sounds.  Abdominal:     Palpations: Abdomen is soft.     Tenderness: There is generalized abdominal tenderness. There is no guarding or rebound.  Musculoskeletal:        General: No swelling.     Cervical back: Neck supple.  Skin:    General: Skin is warm and dry.     Capillary Refill: Capillary refill takes less than 2 seconds.  Neurological:     General: No focal deficit present.     Mental Status: She is alert.     ED Results / Procedures / Treatments   Labs (all labs ordered are listed, but only abnormal results are displayed) Labs Reviewed  LIPASE, BLOOD - Abnormal; Notable for the following components:       Result Value   Lipase 55 (*)    All other components within normal limits  COMPREHENSIVE METABOLIC PANEL - Abnormal; Notable for the following components:   Glucose, Bld 127 (*)    Albumin 3.3 (*)    All other components within normal limits  SARS CORONAVIRUS 2 BY RT PCR  GASTROINTESTINAL PANEL BY PCR, STOOL (REPLACES STOOL CULTURE)  CBC  URINALYSIS, ROUTINE W REFLEX MICROSCOPIC  I-STAT BETA HCG BLOOD, ED ([facility], [facility], [facility] ONLY)    EKG None  Radiology CT Abdomen Pelvis W Contrast  Result Date: 06/20/2022 CLINICAL DATA:  Nausea/vomiting Abdominal pain, acute, nonlocalized. History of appendectomy EXAM: CT ABDOMEN AND PELVIS WITH CONTRAST TECHNIQUE: Multidetector CT imaging of the abdomen and pelvis was performed using the standard protocol following bolus administration of intravenous contrast. RADIATION DOSE REDUCTION: This exam was performed according to the departmental dose-optimization program which includes automated exposure control, adjustment of the mA and/or kV according to patient size and/or use of iterative reconstruction technique. CONTRAST:  OMNIPAQUE IOHEXOL 350 MG/ML SOLN COMPARISON:  None Available. FINDINGS: Lower chest: Dependent bibasilar atelectasis. Small-moderate-sized hiatal hernia. Heart size within normal limits. Hepatobiliary: No focal liver abnormality is seen. No gallstones, gallbladder wall thickening, or biliary dilatation. Pancreas: Unremarkable. No pancreatic ductal dilatation or surrounding inflammatory changes. Spleen: Normal in size without focal abnormality. Adrenals/Urinary Tract: Unremarkable adrenal glands. Kidneys enhance symmetrically without focal lesion, stone, or hydronephrosis. Ureters are nondilated. Mild urinary bladder wall thickening. Stomach/Bowel: Small-moderate hiatal hernia. Stomach otherwise within normal limits. No dilated loops of bowel. Appendix is not visualized, surgically absent by history. No focal bowel wall thickening or inflammatory  changes. Vascular/Lymphatic: No significant vascular findings are present. Increased attenuation within the central mesentery containing multiple prominent, although not pathologically enlarged, mesenteric lymph nodes. No abdominopelvic lymphadenopathy by size criteria. Reproductive: Uterus and bilateral adnexa are unremarkable. Other: Trace free fluid within the pelvis. No abdominopelvic fluid collection. No pneumoperitoneum. No abdominal wall hernia. Musculoskeletal: No acute or significant osseous findings. IMPRESSION: 1. Mild urinary bladder wall thickening, which can be seen in the setting of cystitis. Correlate with urinalysis. 2. Increased attenuation within the central mesentery containing multiple prominent, although not pathologically enlarged, mesenteric lymph nodes. Findings are nonspecific and can be seen in the setting of mesenteric panniculitis. 3. Small-moderate-sized hiatal hernia. 4. Trace free fluid within the pelvis, likely physiologic. Electronically Signed   By: Duanne Guess D.O.   On: 06/20/2022 10:25    Procedures Procedures    Medications Ordered in ED Medications  morphine (PF) 4 MG/ML injection 4 mg (has no administration in time range)  sodium chloride 0.9 % bolus 1,000 mL (has no administration in time range)    ED Course/ Medical Decision  Making/ A&P Clinical Course as of 06/20/22 1835  Fri Jun 20, 2022  1331 Patient's work-up has been unremarkable.  She has been able to tolerate p.o. here with no vomiting or diarrhea.  She still has some upper abdominal discomfort despite PPI and medications.  CT showing hiatal hernia possibly cause of her discomfort.  We will make sure she is on a PPI and give her some Imodium.  She has a primary care doctor so recommended close follow-up with them.  Return instructions discussed [MB]    Clinical Course User Index [MB] Hayden Rasmussen, MD                           Medical Decision Making Amount and/or Complexity of Data  Reviewed Labs: ordered. Radiology: ordered.  Risk Prescription drug management.  This patient complains of epigastric abdominal pain nausea and vomiting diarrhea; this involves an extensive number of treatment Options and is a complaint that carries with it a high risk of complications and morbidity. The differential includes gastroenteritis, gastritis, peptic ulcer disease, obstruction, infectious diarrhea  I ordered, reviewed and interpreted labs, which included CBC normal, chemistries normal other than elevated glucose, LFTs normal, COVID-negative, pregnancy test negative, stool studies ordered and not obtained I ordered medication IV fluids and pain medicine, PPI and reviewed PMP when indicated. I ordered imaging studies which included CT abdomen and pelvis and I independently    visualized and interpreted imaging which showed no acute findings.  Does have hiatal hernia, lymphadenopathy Additional history obtained from patient's family member Previous records obtained and reviewed in epic no recent admissions  Cardiac monitoring reviewed, normal sinus rhythm Social determinants considered, no significant barriers Critical Interventions: None  After the interventions stated above, I reevaluated the patient and found patient to be well-appearing with improved symptoms tolerating p.o. Admission and further testing considered, no indications for admission or further work-up at this time.  Will trial on PPI and recommended close follow-up with PCP.  Return instructions discussed          Final Clinical Impression(s) / ED Diagnoses Final diagnoses:  Epigastric pain  Nausea vomiting and diarrhea    Rx / DC Orders ED Discharge Orders          Ordered    ondansetron (ZOFRAN) 4 MG tablet  Every 8 hours PRN        06/20/22 1333    loperamide (IMODIUM) 2 MG capsule  4 times daily PRN        06/20/22 1333    pantoprazole (PROTONIX) 40 MG tablet  Daily        06/20/22 1333               Hayden Rasmussen, MD 06/20/22 Bosie Helper

## 2022-06-20 NOTE — ED Notes (Signed)
Pt given water and crackers for PO challenge

## 2022-06-20 NOTE — ED Triage Notes (Signed)
Pt states that she has been having diarrhea/ n/v and bad stomach pain for several days. Denies fevers.

## 2022-06-20 NOTE — Discharge Instructions (Addendum)
You were seen in the emergency department for nausea vomiting abdominal pain and diarrhea.  You had lab work and a CAT scan that did not show an obvious explanation for your symptoms.  We are prescribing some nausea medication, diarrhea medication and some acid medication.  You can use Tylenol for pain.  Clear liquid diet advance as tolerated.  Follow-up with your primary care doctor.  Return to the emergency department if any worsening or concerning symptoms

## 2022-07-30 IMAGING — DX DG CERVICAL SPINE COMPLETE 4+V
5 series · 5 of 5 positions shown · non-contrast
Comparison: None.

CLINICAL DATA: Left-sided head and face numbness off and on, no
facial droop

EXAM:
CERVICAL SPINE - COMPLETE 4+ VIEW

[c-spine lat]
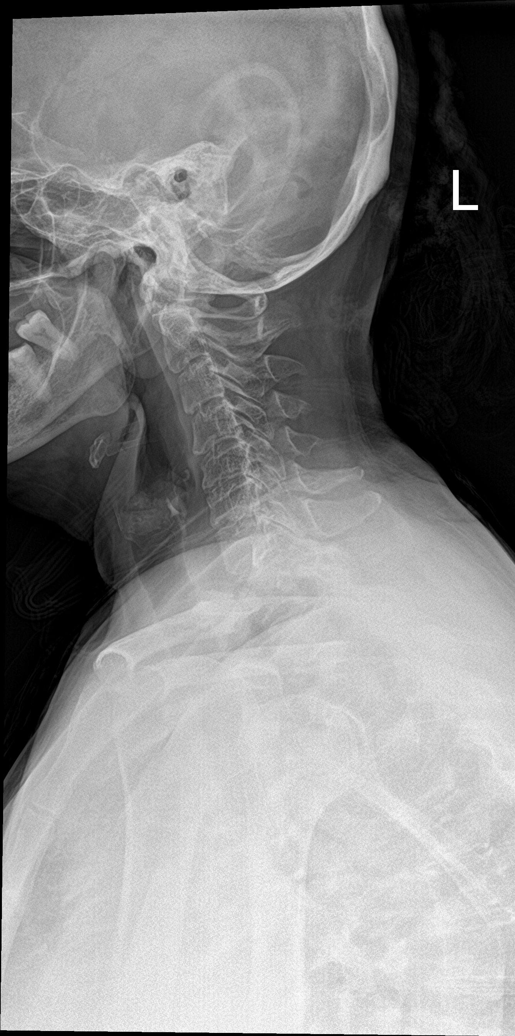

[c-spine obl (1 of 2)]
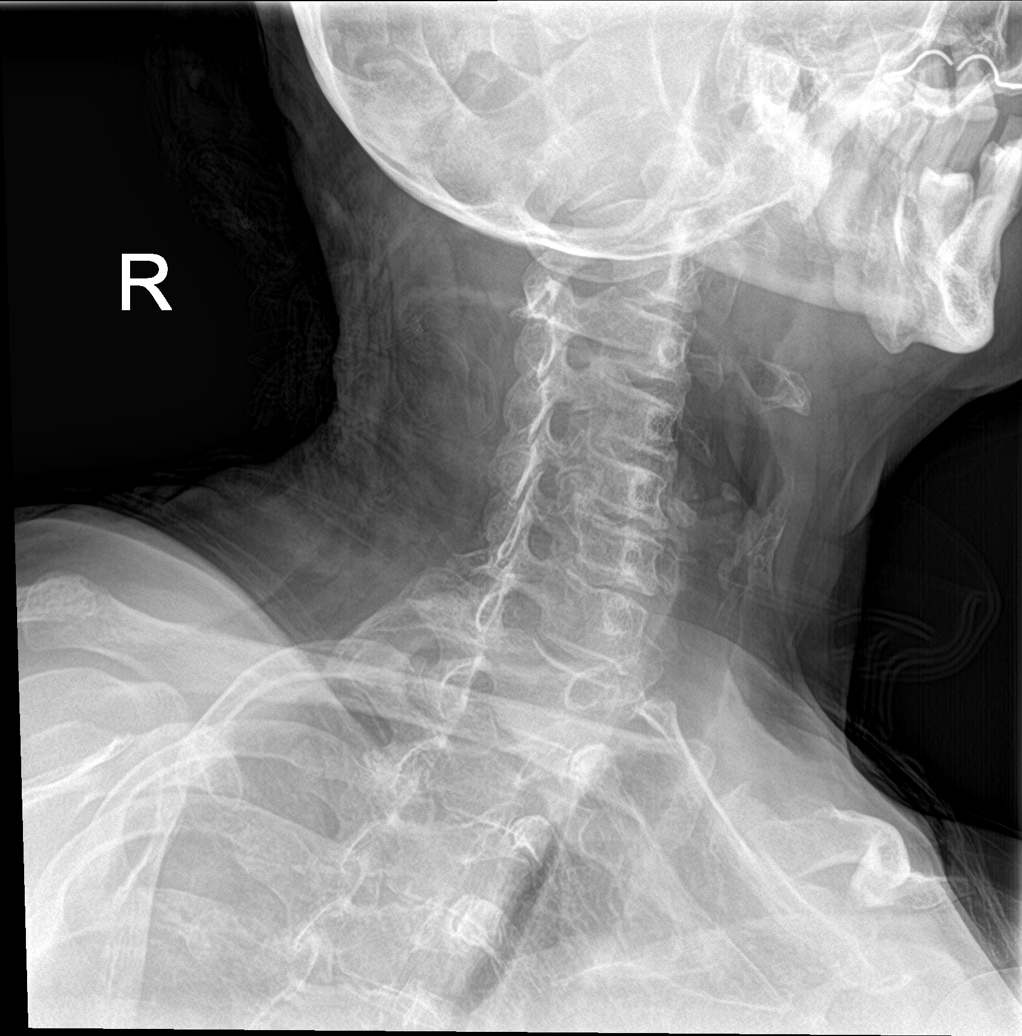

[c-spine obl (2 of 2)]
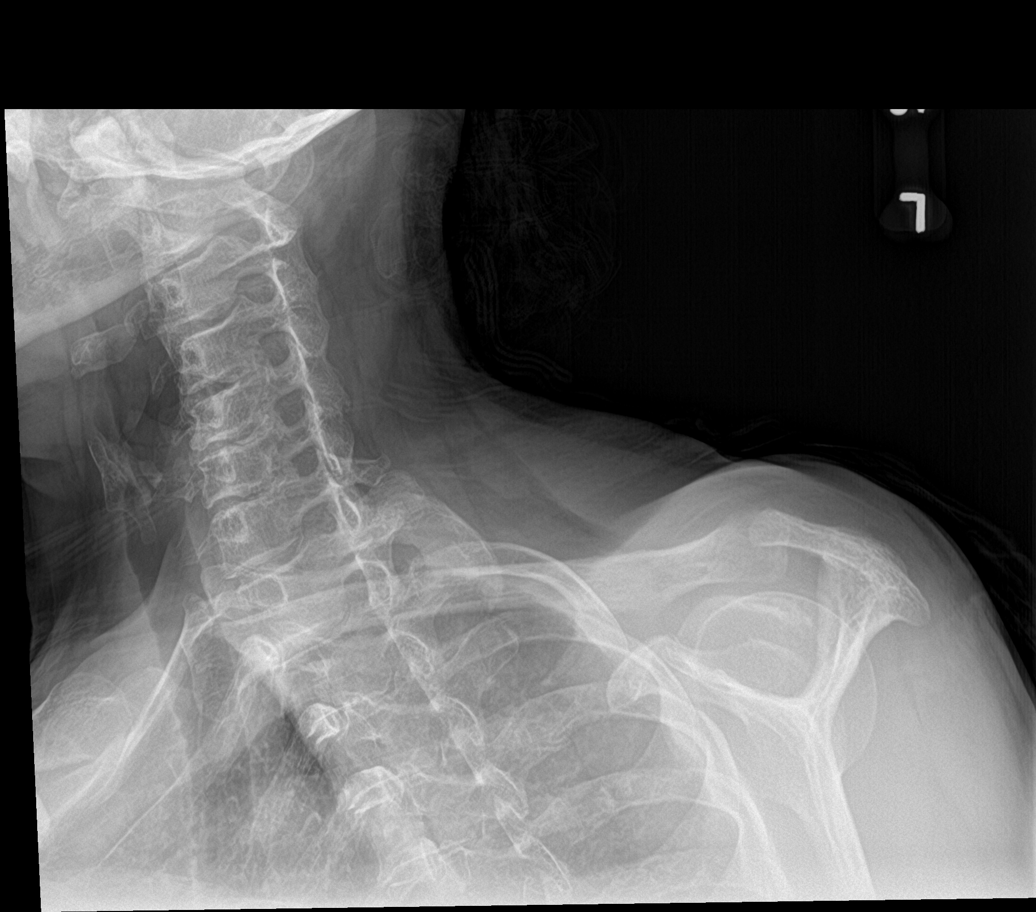

[c-spine ap]
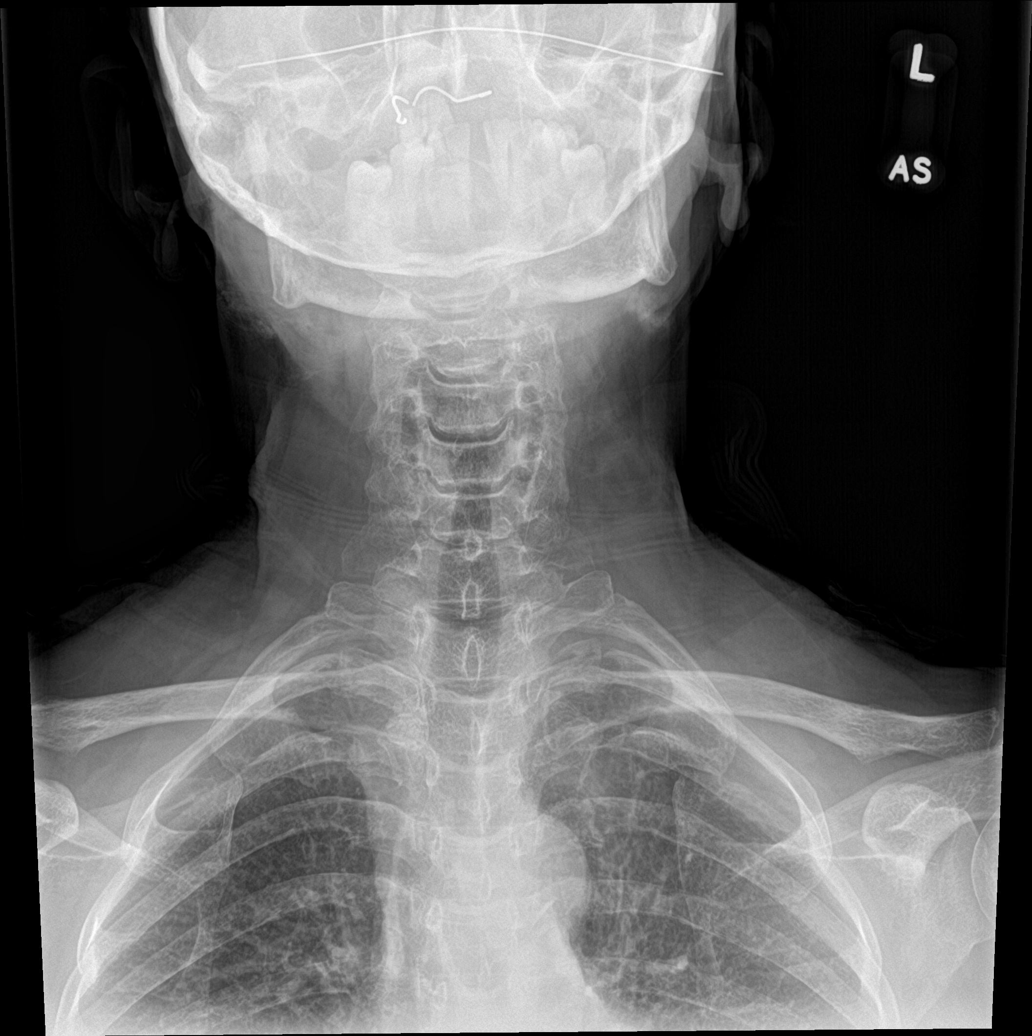

[c-spine open mouth]
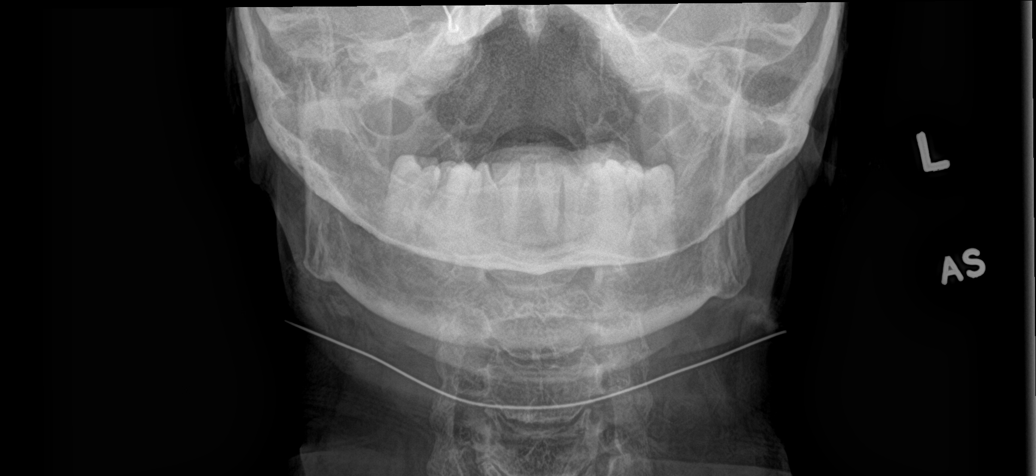

[5 of 5 positions shown; findings below may reference images not displayed]

FINDINGS: There is no evidence of cervical spine fracture or prevertebral soft
tissue swelling. Straightening of the normal cervical lordosis.
Right-greater-than-left uncovertebral and facet arthropathy. No
other significant bone abnormalities are identified.
IMPRESSION: Negative cervical spine radiographs.

## 2023-02-16 ENCOUNTER — Telehealth: Payer: Self-pay | Admitting: *Deleted

## 2023-02-16 NOTE — Telephone Encounter (Signed)
Pt speaks Arabic and daughter Benard Rink answered the phone and stated she helped pt with her medical appt. Pt's dtr indicated pt had not completed the iFOBT test she received at her 02/25/22 appt with Dr. Margarita Rana. (Historically , pt had referral to GI 09/28/17 but no appt occurred with GI) and pt had a previous cancelled iFOBT order 02/11/19.) PCP notes indicate pt due back 3/24 for annual check up but no current appt scheduled so pt's daughter states she will call office for appt for pt and to discuss pt's colon cancer screening at that time.

## 2023-03-16 ENCOUNTER — Ambulatory Visit (HOSPITAL_COMMUNITY)
Admission: EM | Admit: 2023-03-16 | Discharge: 2023-03-16 | Disposition: A | Discharge status: Home / Self Care | Payer: Self-pay | Attending: Internal Medicine | Admitting: Internal Medicine

## 2023-03-16 ENCOUNTER — Encounter (HOSPITAL_COMMUNITY): Payer: Self-pay | Admitting: Emergency Medicine

## 2023-03-16 DIAGNOSIS — J309 Allergic rhinitis, unspecified: Secondary | ICD-10-CM

## 2023-03-16 DIAGNOSIS — H1013 Acute atopic conjunctivitis, bilateral: Secondary | ICD-10-CM

## 2023-03-16 MED ORDER — CETIRIZINE HCL 10 MG PO TABS: 10.0000 mg | ORAL_TABLET | Freq: Every day | ORAL | 0 refills | Status: DC

## 2023-03-16 MED ORDER — OLOPATADINE HCL 0.2 % OP SOLN
1.0000 [drp] | Freq: Every day | OPHTHALMIC | 0 refills | Status: DC
Start: 2023-03-16 — End: 2023-09-23

## 2023-03-16 MED ORDER — ALBUTEROL SULFATE HFA 108 (90 BASE) MCG/ACT IN AERS: 1.0000 | INHALATION_SPRAY | Freq: Four times a day (QID) | RESPIRATORY_TRACT | 0 refills | Status: DC | PRN

## 2023-03-16 MED ORDER — BENZONATATE 100 MG PO CAPS: 100.0000 mg | ORAL_CAPSULE | Freq: Three times a day (TID) | ORAL | 0 refills | Status: DC | PRN

## 2023-03-16 NOTE — ED Provider Notes (Signed)
[facility]    CSN: [id number] Arrival date & time: 03/16/23  1623      History   Chief Complaint Chief Complaint  Patient presents with   Cough   Nasal Congestion    HPI Amy Flowers is a 57 y.o. female was to the urgent care with 1 day history of itchy eyes, nasal congestion and cough.  Patient's eyes are red from rubbing her eyes.  Patient's symptoms started fairly abruptly and has been persistent.  She denies any history of seasonal allergies.  No fever or chills.  No sore throat.  He complains of some wheezing.  No nausea, vomiting or diarrhea.  No sick contacts.  No chest pain or chest pressure.  She has some mild chest tightness with the wheezing.  Cough is nonproductive.  HPI  Past Medical History:  Diagnosis Date   Aortic atherosclerosis 06/26/2017   GERD (gastroesophageal reflux disease) 02/14/2019   Hiatal hernia 02/14/2019   Vitamin D deficiency 09/28/2017    Patient Active Problem List   Diagnosis Date Noted   Esophageal motility disorder 02/14/2019   GERD (gastroesophageal reflux disease) 02/14/2019   Hiatal hernia 02/14/2019   Dysphagia 02/09/2019   Dyspnea 02/09/2019   Vitamin D deficiency 09/28/2017   Osteoarthritis of both knees 07/01/2017   Aortic atherosclerosis 06/26/2017    Class: Diagnosis of    Past Surgical History:  Procedure Laterality Date   APPENDECTOMY      OB History   No obstetric history on file.      Home Medications    Prior to Admission medications   Medication Sig Start Date End Date Taking? Authorizing Provider  albuterol (VENTOLIN HFA) 108 (90 Base) MCG/ACT inhaler Inhale 1 puff into the lungs every 6 (six) hours as needed for wheezing or shortness of breath. 03/16/23  Yes Jaselyn Nahm, Britta Mccreedy, MD  benzonatate (TESSALON) 100 MG capsule Take 1 capsule (100 mg total) by mouth 3 (three) times daily as needed for cough. 03/16/23  Yes Jarrel Knoke, Britta Mccreedy, MD  cetirizine (ZYRTEC ALLERGY) 10 MG tablet Take 1 tablet (10 mg total) by  mouth daily. 03/16/23  Yes Dontrez Pettis, Britta Mccreedy, MD  Olopatadine HCl 0.2 % SOLN Apply 1 drop to eye daily. 03/16/23  Yes Mahkai Fangman, Britta Mccreedy, MD  aspirin EC 81 MG tablet Take 1 tablet (81 mg total) by mouth daily. Patient not taking: Reported on 02/25/2022 07/01/17   Anders Simmonds, PA-C  loperamide (IMODIUM) 2 MG capsule Take 1 capsule (2 mg total) by mouth 4 (four) times daily as needed for diarrhea or loose stools. 06/20/22   Terrilee Files, MD  meloxicam (MOBIC) 7.5 MG tablet Take 1 tablet (7.5 mg total) by mouth daily. Patient not taking: Reported on 02/25/2022 08/13/17   Hoy Register, MD  methylPREDNISolone (MEDROL DOSEPAK) 4 MG TBPK tablet Take 24 mg on day 1, 20 mg on day 2, 16 mg on day 3, 12 mg on day 4, 8 mg on day 5, 4 mg on day 6. Patient not taking: Reported on 02/25/2022 01/09/22   Theadora Rama Scales, PA-C  Vitamin D, Ergocalciferol, (DRISDOL) 50000 units CAPS capsule Take 1 capsule (50,000 Units total) by mouth every 7 (seven) days. Patient not taking: Reported on 02/25/2022 09/29/17   Hoy Register, MD    Family History No family history on file.  Social History Social History   Tobacco Use   Smoking status: Never   Smokeless tobacco: Never  Vaping Use   Vaping Use: Never used  Substance Use Topics   Alcohol use: No   Drug use: No     Allergies   Patient has no known allergies.   Review of Systems Review of Systems  Respiratory:  Positive for cough and wheezing.   Cardiovascular: Negative.   Genitourinary: Negative.   Neurological: Negative.      Physical Exam Triage Vital Signs ED Triage Vitals  Enc Vitals Group     BP 03/16/23 1634 122/80     Pulse Rate 03/16/23 1634 78     Resp 03/16/23 1634 18     Temp 03/16/23 1634 98 F (36.7 C)     Temp Source 03/16/23 1634 Oral     SpO2 03/16/23 1634 99 %     Weight --      Height --      Head Circumference --      Peak Flow --      Pain Score 03/16/23 1632 0     Pain Loc --      Pain Edu? --       Excl. in GC? --    No data found.  Updated Vital Signs BP 122/80 (BP Location: Right Arm)   Pulse 78   Temp 98 F (36.7 C) (Oral)   Resp 18   SpO2 99%   Visual Acuity Right Eye Distance:   Left Eye Distance:   Bilateral Distance:    Right Eye Near:   Left Eye Near:    Bilateral Near:     Physical Exam Vitals and nursing note reviewed.  Constitutional:      General: She is not in acute distress.    Appearance: She is not ill-appearing.  HENT:     Right Ear: Tympanic membrane normal.     Left Ear: Tympanic membrane normal.     Nose: Nose normal. No congestion or rhinorrhea.     Mouth/Throat:     Mouth: Mucous membranes are moist.     Pharynx: No posterior oropharyngeal erythema.  Eyes:     Pupils: Pupils are equal, round, and reactive to light.     Comments: Bilateral conjunctivitis erythema.  No purulent discharge.  Cardiovascular:     Rate and Rhythm: Normal rate and regular rhythm.  Pulmonary:     Effort: Pulmonary effort is normal.     Breath sounds: Normal breath sounds.  Abdominal:     General: Bowel sounds are normal.     Palpations: Abdomen is soft.  Neurological:     Mental Status: She is alert.      UC Treatments / Results  Labs (all labs ordered are listed, but only abnormal results are displayed) Labs Reviewed - No data to display  EKG   Radiology No results found.  Procedures Procedures (including critical care time)  Medications Ordered in UC Medications - No data to display  Initial Impression / Assessment and Plan / UC Course  I have reviewed the triage vital signs and the nursing notes.  Pertinent labs & imaging results that were available during my care of the patient were reviewed by me and considered in my medical decision making (see chart for details).     1.  Allergic rhinoconjunctivitis: Patanol eyedrops Zyrtec 10 mg orally daily Albuterol inhaler Tessalon Perles as needed for cough If symptoms persist patient is  advised to return to urgent care to be reevaluated. Final Clinical Impressions(s) / UC Diagnoses   Final diagnoses:  Allergic rhinoconjunctivitis of both eyes   Discharge Instructions  None    ED Prescriptions     Medication Sig Dispense Auth. Provider   Olopatadine HCl 0.2 % SOLN Apply 1 drop to eye daily. 2.5 mL Julanne Schlueter, Britta Mccreedy, MD   cetirizine (ZYRTEC ALLERGY) 10 MG tablet Take 1 tablet (10 mg total) by mouth daily. 30 tablet Zaydon Kinser, Britta Mccreedy, MD   albuterol (VENTOLIN HFA) 108 (90 Base) MCG/ACT inhaler Inhale 1 puff into the lungs every 6 (six) hours as needed for wheezing or shortness of breath. 6.7 g Lyam Provencio, Britta Mccreedy, MD   benzonatate (TESSALON) 100 MG capsule Take 1 capsule (100 mg total) by mouth 3 (three) times daily as needed for cough. 21 capsule Suda Forbess, Britta Mccreedy, MD      PDMP not reviewed this encounter.   Merrilee Jansky, MD 03/16/23 1726

## 2023-03-16 NOTE — Discharge Instructions (Signed)
Please maintain adequate hydration Use medications as directed If you have worsening symptoms please return to urgent care to be reevaluated.

## 2023-03-16 NOTE — ED Triage Notes (Signed)
Pt has cough, congestion and itching eyes since yesterday. Has bad allergies.

## 2023-09-10 ENCOUNTER — Encounter (HOSPITAL_BASED_OUTPATIENT_CLINIC_OR_DEPARTMENT_OTHER): Payer: Self-pay | Admitting: Urology

## 2023-09-10 ENCOUNTER — Ambulatory Visit (HOSPITAL_COMMUNITY)
Admission: EM | Admit: 2023-09-10 | Discharge: 2023-09-10 | Disposition: A | Discharge status: Home / Self Care | Payer: Self-pay | Attending: Emergency Medicine | Admitting: Emergency Medicine

## 2023-09-10 ENCOUNTER — Other Ambulatory Visit: Payer: Self-pay

## 2023-09-10 ENCOUNTER — Emergency Department (HOSPITAL_BASED_OUTPATIENT_CLINIC_OR_DEPARTMENT_OTHER): Payer: Self-pay

## 2023-09-10 ENCOUNTER — Encounter (HOSPITAL_COMMUNITY): Payer: Self-pay | Admitting: Emergency Medicine

## 2023-09-10 ENCOUNTER — Emergency Department (HOSPITAL_BASED_OUTPATIENT_CLINIC_OR_DEPARTMENT_OTHER)
Admission: EM | Admit: 2023-09-10 | Discharge: 2023-09-10 | Disposition: A | Discharge status: Home / Self Care | Payer: Self-pay | Attending: Emergency Medicine | Admitting: Emergency Medicine

## 2023-09-10 DIAGNOSIS — Z7982 Long term (current) use of aspirin: Secondary | ICD-10-CM | POA: Insufficient documentation

## 2023-09-10 DIAGNOSIS — H65193 Other acute nonsuppurative otitis media, bilateral: Secondary | ICD-10-CM | POA: Insufficient documentation

## 2023-09-10 DIAGNOSIS — M79604 Pain in right leg: Secondary | ICD-10-CM | POA: Insufficient documentation

## 2023-09-10 DIAGNOSIS — S0340XA Sprain of jaw, unspecified side, initial encounter: Secondary | ICD-10-CM | POA: Insufficient documentation

## 2023-09-10 DIAGNOSIS — R252 Cramp and spasm: Secondary | ICD-10-CM | POA: Insufficient documentation

## 2023-09-10 DIAGNOSIS — R6884 Jaw pain: Secondary | ICD-10-CM | POA: Insufficient documentation

## 2023-09-10 LAB — CBC
HCT: 39.7 % (ref 36.0–46.0)
Hemoglobin: 13.4 g/dL (ref 12.0–15.0)
MCH: 30.9 pg (ref 26.0–34.0)
MCHC: 33.8 g/dL (ref 30.0–36.0)
MCV: 91.7 fL (ref 80.0–100.0)
Platelets: 208 10*3/uL (ref 150–400)
RBC: 4.33 MIL/uL (ref 3.87–5.11)
RDW: 12.6 % (ref 11.5–15.5)
WBC: 3 10*3/uL — ABNORMAL LOW (ref 4.0–10.5)
nRBC: 0 % (ref 0.0–0.2)

## 2023-09-10 LAB — BASIC METABOLIC PANEL
Anion gap: 8 (ref 5–15)
BUN: 8 mg/dL (ref 6–20)
CO2: 26 mmol/L (ref 22–32)
Calcium: 8.9 mg/dL (ref 8.9–10.3)
Chloride: 104 mmol/L (ref 98–111)
Creatinine, Ser: 0.74 mg/dL (ref 0.44–1.00)
GFR, Estimated: >60 mL/min (ref >60)
Glucose, Bld: 86 mg/dL (ref 70–99)
Potassium: 3.9 mmol/L (ref 3.5–5.1)
Sodium: 138 mmol/L (ref 135–145)

## 2023-09-10 MED ORDER — NAPROXEN 500 MG PO TABS: 500.0000 mg | ORAL_TABLET | Freq: Two times a day (BID) | ORAL | 0 refills | Status: DC

## 2023-09-10 MED ORDER — IBUPROFEN 800 MG PO TABS: 800.0000 mg | ORAL_TABLET | Freq: Three times a day (TID) | ORAL | 0 refills | Status: DC

## 2023-09-10 MED ORDER — CETIRIZINE HCL 10 MG PO TABS: 10.0000 mg | ORAL_TABLET | Freq: Every day | ORAL | 0 refills | Status: DC

## 2023-09-10 MED ORDER — ACETAMINOPHEN 325 MG PO TABS
650.0000 mg | ORAL_TABLET | Freq: Once | ORAL | Status: AC
  Administered 2023-09-10: 650 mg via ORAL
  Filled 2023-09-10: qty 2

## 2023-09-10 MED ORDER — KETOROLAC TROMETHAMINE 30 MG/ML IJ SOLN
30.0000 mg | Freq: Once | INTRAMUSCULAR | Status: AC
  Administered 2023-09-10: 30 mg via INTRAMUSCULAR
  Filled 2023-09-10: qty 1

## 2023-09-10 MED ORDER — FLUTICASONE PROPIONATE 50 MCG/ACT NA SUSP: 2.0000 | Freq: Every day | NASAL | 0 refills | Status: DC

## 2023-09-10 MED ORDER — CYCLOBENZAPRINE HCL 10 MG PO TABS: 10.0000 mg | ORAL_TABLET | Freq: Two times a day (BID) | ORAL | 0 refills | Status: DC | PRN

## 2023-09-10 MED ORDER — DEXAMETHASONE SODIUM PHOSPHATE 10 MG/ML IJ SOLN
10.0000 mg | Freq: Once | INTRAMUSCULAR | Status: AC
  Administered 2023-09-10: 10 mg via INTRAMUSCULAR
  Filled 2023-09-10: qty 1

## 2023-09-10 MED ORDER — ACETAMINOPHEN 325 MG PO TABS: 650.0000 mg | ORAL_TABLET | Freq: Four times a day (QID) | ORAL | 0 refills | Status: DC | PRN

## 2023-09-10 MED ORDER — CYCLOBENZAPRINE HCL 5 MG PO TABS
5.0000 mg | ORAL_TABLET | Freq: Once | ORAL | Status: AC
  Administered 2023-09-10: 5 mg via ORAL
  Filled 2023-09-10: qty 1

## 2023-09-10 MED ORDER — DEXAMETHASONE SODIUM PHOSPHATE 10 MG/ML IJ SOLN
10.0000 mg | Freq: Once | INTRAMUSCULAR | Status: DC
  Filled 2023-09-10: qty 1

## 2023-09-10 NOTE — ED Notes (Signed)
Patient returned from CT

## 2023-09-10 NOTE — Discharge Instructions (Addendum)
Start taking Zyrtec and using Flonase daily to help with fluid draining from ears.  You can alternate between ibuprofen and Tylenol as needed for jaw pain. I have ordered some labs to evaluate your leg cramping, and will call if results are abnormal.  Otherwise I would pick up some Pedialyte or Gatorade with electrolytes and drink those over the next few days.  You can also apply heat and ice to leg as needed.  If your symptoms persist come back for reevaluation.  If your symptoms worsen or you develop a high fever please seek immediate medical treatment in the ER.  ???? ?????? Zyrtec ???????? Flonase ?????? ???????? ?? ????? ??????? ?? ???????.  ????? ??????? ??? ???????????? ??????????? ??? ?????? ???? ????.  ??? ???? ?? ??? ????????? ????? ???? ????? ?????? ?? ??? ???? ??????? ??? ??????.  ???? ????? ?????? ??? Pedialyte ?? Gatorade ?? ????????????? ??????? ???? ?????? ??????? ???????.  ????? ????? ????? ??????? ?????? ??? ????? ??? ??????.  ??? ?????? ???????? ????? ?????? ???????.  ??? ?????? ?????? ?? ???? ???? ?????? ????? ??? ?????? ????? ?????? ?? ???? ???????. abda bitanawul Zyrtec wastikhdam Flonase ywmyan lilmusaeadat fi tasrif alsawayil min al'udhunayni. [place] altanawub bayn al'iibubrufin waltaaylinul hasab alhajat li'alam alfik. laqad talabt min baed almukhtabarat taqyim tashanuj saqika, wasa'atasil bik 'iidha kanat alnatayij ghayr tabieiatin. wa'iilaa fa'iinani sa'altaqit baed Pedialyte 'aw Gatorade mae al'iiliktrulitat wa'ashrabiha khilal al'ayaam alqalilat alqadimati. [place] aydan tatbiq alhararat walthalj ealaa alsaaq hasab alhajati. 'iidha aistamarat al'aeradi, farjie li'iieadat altaqyimi. 'iidhan tafaqamat 'aeraduk 'aw 'usibat bihumaa shadidatin, fayurjaa talab aleilaj altibiyi alfawrii fi ghurfat altawarii.

## 2023-09-10 NOTE — ED Provider Notes (Signed)
[place] EMERGENCY DEPARTMENT AT [facility] Provider Note   CSN: [id number] Arrival date & time: 09/10/23  1333     History  Chief Complaint  Patient presents with   Jaw Pain   Leg Pain    Amy Flowers is a 57 y.o. female presents today for evaluation of jaw pain and leg pain.  Patient reports left-sided facial pain that started yesterday.  Patient reports she has had difficulty eating and swallowing due to pain..  She also reports pain in the right calf that started a week ago, radiating to her hip. Denies any swelling.  Denies any recent fall or trauma.  Denies any neck pain, chest pain, shortness of breath headache, lightheadedness, weakness or numbness.   Leg Pain   Past Medical History:  Diagnosis Date   Aortic atherosclerosis (HCC) 06/26/2017   GERD (gastroesophageal reflux disease) 02/14/2019   Hiatal hernia 02/14/2019   Vitamin D deficiency 09/28/2017   Past Surgical History:  Procedure Laterality Date   APPENDECTOMY       Home Medications Prior to Admission medications   Medication Sig Start Date End Date Taking? Authorizing Provider  acetaminophen (TYLENOL) 325 MG tablet Take 2 tablets (650 mg total) by mouth every 6 (six) hours as needed. 09/10/23   Wynonia Lawman A, NP  albuterol (VENTOLIN HFA) 108 (90 Base) MCG/ACT inhaler Inhale 1 puff into the lungs every 6 (six) hours as needed for wheezing or shortness of breath. 03/16/23   Merrilee Jansky, MD  aspirin EC 81 MG tablet Take 1 tablet (81 mg total) by mouth daily. Patient not taking: Reported on 02/25/2022 07/01/17   Anders Simmonds, PA-C  benzonatate (TESSALON) 100 MG capsule Take 1 capsule (100 mg total) by mouth 3 (three) times daily as needed for cough. 03/16/23   Merrilee Jansky, MD  cetirizine (ZYRTEC ALLERGY) 10 MG tablet Take 1 tablet (10 mg total) by mouth daily. 09/10/23   Wynonia Lawman A, NP  fluticasone (FLONASE) 50 MCG/ACT nasal spray Place 2 sprays into both nostrils daily. 09/10/23    Wynonia Lawman A, NP  ibuprofen (ADVIL) 800 MG tablet Take 1 tablet (800 mg total) by mouth 3 (three) times daily. 09/10/23   Wynonia Lawman A, NP  loperamide (IMODIUM) 2 MG capsule Take 1 capsule (2 mg total) by mouth 4 (four) times daily as needed for diarrhea or loose stools. 06/20/22   Terrilee Files, MD  meloxicam (MOBIC) 7.5 MG tablet Take 1 tablet (7.5 mg total) by mouth daily. Patient not taking: Reported on 02/25/2022 08/13/17   Hoy Register, MD  methylPREDNISolone (MEDROL DOSEPAK) 4 MG TBPK tablet Take 24 mg on day 1, 20 mg on day 2, 16 mg on day 3, 12 mg on day 4, 8 mg on day 5, 4 mg on day 6. Patient not taking: Reported on 02/25/2022 01/09/22   Theadora Rama Scales, PA-C  Olopatadine HCl 0.2 % SOLN Apply 1 drop to eye daily. 03/16/23   LampteyBritta Mccreedy, MD  Vitamin D, Ergocalciferol, (DRISDOL) 50000 units CAPS capsule Take 1 capsule (50,000 Units total) by mouth every 7 (seven) days. Patient not taking: Reported on 02/25/2022 09/29/17   Hoy Register, MD      Allergies    Patient has no known allergies.    Review of Systems   Review of Systems Negative except as per HPI.  Physical Exam Updated Vital Signs BP (!) 148/71   Pulse 85   Temp 98.1 F (36.7 C) (Oral)  Resp 16   Ht 5\' 1"  (1.549 m)   Wt 70.7 kg   SpO2 100%   BMI 29.45 kg/m  Physical Exam Vitals and nursing note reviewed.  Constitutional:      Appearance: Normal appearance.  HENT:     Head: Normocephalic and atraumatic.     Mouth/Throat:     Mouth: Mucous membranes are moist.     Comments: Limited range of motion of lower jaw. No sign of dental abscess/fluctuance. Eyes:     General: No scleral icterus. Cardiovascular:     Rate and Rhythm: Normal rate and regular rhythm.     Pulses: Normal pulses.     Heart sounds: Normal heart sounds.  Pulmonary:     Effort: Pulmonary effort is normal.     Breath sounds: Normal breath sounds.  Abdominal:     General: Abdomen is flat.     Palpations: Abdomen  is soft.     Tenderness: There is no abdominal tenderness.  Musculoskeletal:        General: No deformity.  Skin:    General: Skin is warm.     Findings: No rash.  Neurological:     General: No focal deficit present.     Mental Status: She is alert.  Psychiatric:        Mood and Affect: Mood normal.     ED Results / Procedures / Treatments   Labs (all labs ordered are listed, but only abnormal results are displayed) Labs Reviewed - No data to display  EKG None  Radiology No results found.  Procedures Procedures    Medications Ordered in ED Medications  cyclobenzaprine (FLEXERIL) tablet 5 mg (has no administration in time range)  dexamethasone (DECADRON) injection 10 mg (has no administration in time range)  acetaminophen (TYLENOL) tablet 650 mg (has no administration in time range)    ED Course/ Medical Decision Making/ A&P                                 Medical Decision Making Amount and/or Complexity of Data Reviewed Radiology: ordered.  Risk OTC drugs. Prescription drug management.   This patient presents to the ED for jaw pain, leg pain, this involves an extensive number of treatment options, and is a complaint that carries with a high risk of complications and morbidity.  The differential diagnosis includes TMJ dislocation, dental abscess, musculoskeletal strain, arthritis.  This is not an exhaustive list.  Imaging studies: I ordered imaging studies, personally reviewed, interpreted imaging and agree with the radiologist's interpretations. The results include: CT maxillofacial showed TMJ arthritis.  Ultrasound of the right leg showed no evidence of DVT.  Problem list/ ED course/ Critical interventions/ Medical management: HPI: See above Vital signs within normal range and stable throughout visit. Laboratory/imaging studies significant for: See above. On physical examination, patient is afebrile and appears in no acute distress.  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  No evidence of dental abscess on my examination. Given Toradol, Decadron, Tylenol and Flexeril.  Patient reports some improvement after these medications.  Ultrasound of the right leg showed no evidence of DVT.  Advised patient to take pain medication and muscle relaxants for symptom relief, follow-up with ENT for further evaluation and management.  Strict ED return question discussed. I have reviewed the patient home medicines and have made adjustments as needed.  Cardiac monitoring/EKG: The patient was maintained  on a cardiac monitor.  I personally reviewed and interpreted the cardiac monitor which showed an underlying rhythm of: sinus rhythm.  Additional history obtained: External records from outside source obtained and reviewed including: Chart review including previous notes, labs, imaging.  Consultations obtained:  Disposition Continued outpatient therapy. Follow-up with ENT or PCP recommended for reevaluation of symptoms. Treatment plan discussed with patient.  Pt acknowledged understanding was agreeable to the plan. Worrisome signs and symptoms were discussed with patient, and patient acknowledged understanding to return to the ED if they noticed these signs and symptoms. Patient was stable upon discharge.   This chart was dictated using voice recognition software.  Despite best efforts to proofread,  errors can occur which can change the documentation meaning.          Final Clinical Impression(s) / ED Diagnoses Final diagnoses:  Jaw pain    Rx / DC Orders ED Discharge Orders          Ordered    cyclobenzaprine (FLEXERIL) 10 MG tablet  2 times daily PRN        09/10/23 1850    naproxen (NAPROSYN) 500 MG tablet  2 times daily        09/10/23 1850              Jeanelle Malling, [place] 09/11/23 1104    Lorre Nick, MD 09/11/23 1540

## 2023-09-10 NOTE — Discharge Instructions (Addendum)
Please take tylenol/ibuprofen/naproxen for pain. I recommend close follow-up with an ENT (ear-nose-throat) doctor  for reevaluation.  Please do not hesitate to return to emergency department if worrisome signs symptoms we discussed become apparent.

## 2023-09-10 NOTE — ED Notes (Signed)
Patient transported to CT 

## 2023-09-10 NOTE — ED Triage Notes (Addendum)
Pt has left side jaw pain that started yesterday morning. It is difficult for her to talk and eat. Pt also c/o leg pain on right side that runs down leg. Denies any falls

## 2023-09-10 NOTE — ED Triage Notes (Signed)
[facility] sent for xray of jaw  Left side facial pain that started yesterday  Pain with swallowing and pain in Jaw with limited ROM of mouth  Also states right sided leg pain that started 1 week ago  No facial droop, no stroke like symptoms noted

## 2023-09-10 NOTE — ED Provider Notes (Signed)
[facility]    CSN: [id number] Arrival date & time: 09/10/23  0913      History   Chief Complaint Chief Complaint  Patient presents with   Jaw Pain   Leg Pain    HPI Amy Flowers is a 57 y.o. female.   Patient presents with left-sided jaw pain that began yesterday morning and has difficulty eating and talking due to pain.  Patient also reports right sided calf cramping that occasionally radiates up to hip.  Denies any known injury.   Leg Pain Associated symptoms: no back pain, no fatigue and no fever     Past Medical History:  Diagnosis Date   Aortic atherosclerosis (HCC) 06/26/2017   GERD (gastroesophageal reflux disease) 02/14/2019   Hiatal hernia 02/14/2019   Vitamin D deficiency 09/28/2017    Patient Active Problem List   Diagnosis Date Noted   Esophageal motility disorder 02/14/2019   GERD (gastroesophageal reflux disease) 02/14/2019   Hiatal hernia 02/14/2019   Dysphagia 02/09/2019   Dyspnea 02/09/2019   Vitamin D deficiency 09/28/2017   Osteoarthritis of both knees 07/01/2017   Aortic atherosclerosis (HCC) 06/26/2017    Class: Diagnosis of    Past Surgical History:  Procedure Laterality Date   APPENDECTOMY      OB History   No obstetric history on file.      Home Medications    Prior to Admission medications   Medication Sig Start Date End Date Taking? Authorizing Provider  acetaminophen (TYLENOL) 325 MG tablet Take 2 tablets (650 mg total) by mouth every 6 (six) hours as needed. 09/10/23  Yes Susann Givens, Huntley Knoop A, NP  cetirizine (ZYRTEC ALLERGY) 10 MG tablet Take 1 tablet (10 mg total) by mouth daily. 09/10/23  Yes Susann Givens, Dunya Meiners A, NP  fluticasone (FLONASE) 50 MCG/ACT nasal spray Place 2 sprays into both nostrils daily. 09/10/23  Yes Susann Givens, Madisson Kulaga A, NP  ibuprofen (ADVIL) 800 MG tablet Take 1 tablet (800 mg total) by mouth 3 (three) times daily. 09/10/23  Yes Susann Givens, Caeli Linehan A, NP  albuterol (VENTOLIN HFA) 108 (90 Base) MCG/ACT inhaler  Inhale 1 puff into the lungs every 6 (six) hours as needed for wheezing or shortness of breath. 03/16/23   Merrilee Jansky, MD  aspirin EC 81 MG tablet Take 1 tablet (81 mg total) by mouth daily. Patient not taking: Reported on 02/25/2022 07/01/17   Anders Simmonds, PA-C  benzonatate (TESSALON) 100 MG capsule Take 1 capsule (100 mg total) by mouth 3 (three) times daily as needed for cough. 03/16/23   Lamptey, Britta Mccreedy, MD  loperamide (IMODIUM) 2 MG capsule Take 1 capsule (2 mg total) by mouth 4 (four) times daily as needed for diarrhea or loose stools. 06/20/22   Terrilee Files, MD  meloxicam (MOBIC) 7.5 MG tablet Take 1 tablet (7.5 mg total) by mouth daily. Patient not taking: Reported on 02/25/2022 08/13/17   Hoy Register, MD  methylPREDNISolone (MEDROL DOSEPAK) 4 MG TBPK tablet Take 24 mg on day 1, 20 mg on day 2, 16 mg on day 3, 12 mg on day 4, 8 mg on day 5, 4 mg on day 6. Patient not taking: Reported on 02/25/2022 01/09/22   Theadora Rama Scales, PA-C  Olopatadine HCl 0.2 % SOLN Apply 1 drop to eye daily. 03/16/23   LampteyBritta Mccreedy, MD  Vitamin D, Ergocalciferol, (DRISDOL) 50000 units CAPS capsule Take 1 capsule (50,000 Units total) by mouth every 7 (seven) days. Patient not taking: Reported on 02/25/2022 09/29/17  Hoy Register, MD    Family History History reviewed. No pertinent family history.  Social History Social History   Tobacco Use   Smoking status: Never   Smokeless tobacco: Never  Vaping Use   Vaping status: Never Used  Substance Use Topics   Alcohol use: No   Drug use: No     Allergies   Patient has no known allergies.   Review of Systems Review of Systems  Constitutional:  Negative for chills, fatigue and fever.  HENT:  Negative for dental problem, sinus pressure, sinus pain and sore throat.   Cardiovascular:  Negative for leg swelling.  Musculoskeletal:  Positive for myalgias. Negative for back pain, gait problem and joint swelling.  Skin:  Negative for  color change.  Neurological:  Negative for weakness and numbness.     Physical Exam Triage Vital Signs ED Triage Vitals  Encounter Vitals Group     BP 09/10/23 1009 132/82     Systolic BP Percentile --      Diastolic BP Percentile --      Pulse Rate 09/10/23 1009 68     Resp 09/10/23 1009 16     Temp 09/10/23 1009 97.7 F (36.5 C)     Temp Source 09/10/23 1009 Oral     SpO2 09/10/23 1009 98 %     Weight --      Height --      Head Circumference --      Peak Flow --      Pain Score 09/10/23 1008 10     Pain Loc --      Pain Education --      Exclude from Growth Chart --    No data found.  Updated Vital Signs BP 132/82 (BP Location: Left Arm)   Pulse 68   Temp 97.7 F (36.5 C) (Oral)   Resp 16   SpO2 98%   Visual Acuity Right Eye Distance:   Left Eye Distance:   Bilateral Distance:    Right Eye Near:   Left Eye Near:    Bilateral Near:     Physical Exam Vitals and nursing note reviewed.  Constitutional:      General: She is awake. She is not in acute distress.    Appearance: Normal appearance. She is well-developed and well-groomed. She is not ill-appearing, toxic-appearing or diaphoretic.  HENT:     Head: Normocephalic.     Jaw: Tenderness and pain on movement present. No swelling.     Comments: Mild tenderness and pain with movement to left upper jaw.    Right Ear: Ear canal and external ear normal. A middle ear effusion is present.     Left Ear: Ear canal and external ear normal. A middle ear effusion is present.     Nose: Congestion and rhinorrhea present.     Right Sinus: No maxillary sinus tenderness or frontal sinus tenderness.     Left Sinus: No maxillary sinus tenderness or frontal sinus tenderness.     Mouth/Throat:     Mouth: Mucous membranes are moist.     Dentition: Normal dentition. No dental tenderness or gingival swelling.     Pharynx: Oropharynx is clear.  Musculoskeletal:     Right hip: Normal.     Left hip: Normal.     Right upper  leg: Normal.     Left upper leg: Normal.     Right knee: Normal.     Left knee: Normal.     Right lower  leg: Tenderness present. No swelling, deformity or bony tenderness. No edema.     Left lower leg: Normal.     Right ankle: Normal.     Left ankle: Normal.     Comments: Mild tenderness upon palpation to right calf muscle.  Skin:    General: Skin is warm and dry.  Neurological:     Mental Status: She is alert.  Psychiatric:        Behavior: Behavior is cooperative.      UC Treatments / Results  Labs (all labs ordered are listed, but only abnormal results are displayed) Labs Reviewed  CBC  BASIC METABOLIC PANEL    EKG   Radiology No results found.  Procedures Procedures (including critical care time)  Medications Ordered in UC Medications - No data to display  Initial Impression / Assessment and Plan / UC Course  I have reviewed the triage vital signs and the nursing notes.  Pertinent labs & imaging results that were available during my care of the patient were reviewed by me and considered in my medical decision making (see chart for details).     Patient presented with left-sided jaw pain that began yesterday.  Patient also reports right-sided calf cramping that radiates to hip.  Denies any known injury.  Upon assessment patient has bilateral middle ear effusions without obvious signs of infection.  Patient is mildly tender and has pain with movement of left upper jaw.  Mild tenderness to right calf muscle noted.  No other significant findings upon assessment.  Ordered blood work to rule out electrolyte abnormality.  Recommended Zyrtec and Flonase to help with ear effusions.  Recommended Tylenol and ibuprofen for jaw pain and leg cramping.  Discussed follow-up, return, emergency department precautions. Final Clinical Impressions(s) / UC Diagnoses   Final diagnoses:  Leg cramping  Acute effusion of both middle ears  TMJ (sprain of temporomandibular joint),  initial encounter     Discharge Instructions      Start taking Zyrtec and using Flonase daily to help with fluid draining from ears.  You can alternate between ibuprofen and Tylenol as needed for jaw pain. I have ordered some labs to evaluate your leg cramping, and will call if results are abnormal.  Otherwise I would pick up some Pedialyte or Gatorade with electrolytes and drink those over the next few days.  You can also apply heat and ice to leg as needed.  If your symptoms persist come back for reevaluation.  If your symptoms worsen or you develop a high fever please seek immediate medical treatment in the ER.  ???? ?????? Zyrtec ???????? Flonase ?????? ???????? ?? ????? ??????? ?? ???????.  ????? ??????? ??? ???????????? ??????????? ??? ?????? ???? ????.  ??? ???? ?? ??? ????????? ????? ???? ????? ?????? ?? ??? ???? ??????? ??? ??????.  ???? ????? ?????? ??? Pedialyte ?? Gatorade ?? ????????????? ??????? ???? ?????? ??????? ???????.  ????? ????? ????? ??????? ?????? ??? ????? ??? ??????.  ??? ?????? ???????? ????? ?????? ???????.  ??? ?????? ?????? ?? ???? ???? ?????? ????? ??? ?????? ????? ?????? ?? ???? ???????. abda bitanawul Zyrtec wastikhdam Flonase ywmyan lilmusaeadat fi tasrif alsawayil min al'udhunayni. [place] altanawub bayn al'iibubrufin waltaaylinul hasab alhajat li'alam alfik. laqad talabt min baed almukhtabarat taqyim tashanuj saqika, wasa'atasil bik 'iidha kanat alnatayij ghayr tabieiatin. wa'iilaa fa'iinani sa'altaqit baed Pedialyte 'aw Gatorade mae al'iiliktrulitat wa'ashrabiha khilal al'ayaam alqalilat alqadimati. [place] aydan tatbiq alhararat walthalj ealaa alsaaq hasab alhajati. 'iidha aistamarat al'aeradi, farjie li'iieadat altaqyimi. 'iidhan tafaqamat 'aeraduk 'aw '  usibat bihumaa shadidatin, fayurjaa talab aleilaj altibiyi alfawrii fi ghurfat altawarii.    ED Prescriptions     Medication Sig Dispense Auth. Provider   cetirizine (ZYRTEC ALLERGY) 10 MG tablet Take 1 tablet  (10 mg total) by mouth daily. 30 tablet Susann Givens, Kainen Struckman A, NP   fluticasone (FLONASE) 50 MCG/ACT nasal spray Place 2 sprays into both nostrils daily. 9.9 mL Wynonia Lawman A, NP   ibuprofen (ADVIL) 800 MG tablet Take 1 tablet (800 mg total) by mouth 3 (three) times daily. 21 tablet Wynonia Lawman A, NP   acetaminophen (TYLENOL) 325 MG tablet Take 2 tablets (650 mg total) by mouth every 6 (six) hours as needed. 30 tablet Wynonia Lawman A, NP      PDMP not reviewed this encounter.   Wynonia Lawman A, NP 09/10/23 1051

## 2023-09-23 ENCOUNTER — Encounter (HOSPITAL_COMMUNITY): Payer: Self-pay | Admitting: Emergency Medicine

## 2023-09-23 ENCOUNTER — Ambulatory Visit (HOSPITAL_COMMUNITY)
Admission: EM | Admit: 2023-09-23 | Discharge: 2023-09-23 | Disposition: A | Discharge status: Home / Self Care | Payer: Medicaid Other | Attending: Emergency Medicine | Admitting: Emergency Medicine

## 2023-09-23 DIAGNOSIS — T7840XA Allergy, unspecified, initial encounter: Secondary | ICD-10-CM

## 2023-09-23 DIAGNOSIS — R21 Rash and other nonspecific skin eruption: Secondary | ICD-10-CM

## 2023-09-23 MED ORDER — DEXAMETHASONE SODIUM PHOSPHATE 10 MG/ML IJ SOLN
INTRAMUSCULAR | Status: AC
  Filled 2023-09-23: qty 1

## 2023-09-23 MED ORDER — PREDNISONE 10 MG (21) PO TBPK: ORAL_TABLET | ORAL | 0 refills | Status: DC

## 2023-09-23 MED ORDER — DIPHENHYDRAMINE HCL 25 MG PO TABS: 25.0000 mg | ORAL_TABLET | Freq: Four times a day (QID) | ORAL | 0 refills | Status: DC | PRN

## 2023-09-23 MED ORDER — DEXAMETHASONE SODIUM PHOSPHATE 10 MG/ML IJ SOLN
10.0000 mg | Freq: Once | INTRAMUSCULAR | Status: AC
  Administered 2023-09-23: 10 mg via INTRAMUSCULAR

## 2023-09-23 NOTE — ED Provider Notes (Signed)
[facility]    CSN: [id number] Arrival date & time: 09/23/23  1751      History   Chief Complaint Chief Complaint  Patient presents with   Pruritis   Allergic Reaction    HPI Secret Amy Flowers is a 57 y.o. female.   Patient presents to clinic with her daughter, who provides language interpretation.  Reports she ate some crushed mixed nuts around 2 days ago and she broke out in hives.  She has an itchy rash to her entire body.  Denies any shortness of breath or wheezing, no known allergies and has tolerated nuts in the past.  She has not tried any interventions for the itchy rash.  Daughter reports patient is having intermittent dizziness, none currently.  Daughter is concerned for hemoglobin.  Denies any blood in stool, syncope or near syncope.  The history is provided by the patient and medical records.  Allergic Reaction Presenting symptoms: rash     Past Medical History:  Diagnosis Date   Aortic atherosclerosis (HCC) 06/26/2017   GERD (gastroesophageal reflux disease) 02/14/2019   Hiatal hernia 02/14/2019   Vitamin D deficiency 09/28/2017    Patient Active Problem List   Diagnosis Date Noted   Esophageal motility disorder 02/14/2019   GERD (gastroesophageal reflux disease) 02/14/2019   Hiatal hernia 02/14/2019   Dysphagia 02/09/2019   Dyspnea 02/09/2019   Vitamin D deficiency 09/28/2017   Osteoarthritis of both knees 07/01/2017   Aortic atherosclerosis (HCC) 06/26/2017    Class: Diagnosis of    Past Surgical History:  Procedure Laterality Date   APPENDECTOMY      OB History   No obstetric history on file.      Home Medications    Prior to Admission medications   Medication Sig Start Date End Date Taking? Authorizing Provider  diphenhydrAMINE (BENADRYL) 25 MG tablet Take 1 tablet (25 mg total) by mouth every 6 (six) hours as needed. 09/23/23  Yes Rinaldo Ratel, [place] N, FNP  predniSONE (STERAPRED UNI-PAK 21 TAB) 10 MG (21) TBPK tablet Take as  prescribed. 09/23/23  Yes Rinaldo Ratel, [place] N, FNP  acetaminophen (TYLENOL) 325 MG tablet Take 2 tablets (650 mg total) by mouth every 6 (six) hours as needed. 09/10/23   Wynonia Lawman A, NP  meloxicam (MOBIC) 7.5 MG tablet Take 1 tablet (7.5 mg total) by mouth daily. Patient not taking: Reported on 02/25/2022 08/13/17   Hoy Register, MD  methylPREDNISolone (MEDROL DOSEPAK) 4 MG TBPK tablet Take 24 mg on day 1, 20 mg on day 2, 16 mg on day 3, 12 mg on day 4, 8 mg on day 5, 4 mg on day 6. Patient not taking: Reported on 02/25/2022 01/09/22   Theadora Rama Scales, PA-C  Vitamin D, Ergocalciferol, (DRISDOL) 50000 units CAPS capsule Take 1 capsule (50,000 Units total) by mouth every 7 (seven) days. Patient not taking: Reported on 02/25/2022 09/29/17   Hoy Register, MD    Family History No family history on file.  Social History Social History   Tobacco Use   Smoking status: Never   Smokeless tobacco: Never  Vaping Use   Vaping status: Never Used  Substance Use Topics   Alcohol use: No   Drug use: No     Allergies   Patient has no known allergies.   Review of Systems Review of Systems  Constitutional:  Negative for fever.  HENT:  Negative for facial swelling.   Skin:  Positive for rash.     Physical Exam Triage Vital Signs  ED Triage Vitals  Encounter Vitals Group     BP 09/23/23 1857 120/72     Systolic BP Percentile --      Diastolic BP Percentile --      Pulse Rate 09/23/23 1857 98     Resp 09/23/23 1857 14     Temp 09/23/23 1857 98.2 F (36.8 C)     Temp Source 09/23/23 1857 Oral     SpO2 09/23/23 1857 98 %     Weight --      Height --      Head Circumference --      Peak Flow --      Pain Score 09/23/23 1855 0     Pain Loc --      Pain Education --      Exclude from Growth Chart --    No data found.  Updated Vital Signs BP 120/72 (BP Location: Left Arm)   Pulse 98   Temp 98.2 F (36.8 C) (Oral)   Resp 14   SpO2 98%   Visual Acuity Right Eye  Distance:   Left Eye Distance:   Bilateral Distance:    Right Eye Near:   Left Eye Near:    Bilateral Near:     Physical Exam Vitals and nursing note reviewed.  Constitutional:      Appearance: Normal appearance.  HENT:     Head: Normocephalic and atraumatic.     Right Ear: External ear normal.     Left Ear: External ear normal.     Nose: Nose normal.     Mouth/Throat:     Mouth: Mucous membranes are moist.  Eyes:     Conjunctiva/sclera: Conjunctivae normal.  Cardiovascular:     Rate and Rhythm: Normal rate and regular rhythm.     Heart sounds: Normal heart sounds. No murmur heard. Pulmonary:     Effort: Pulmonary effort is normal. No respiratory distress.     Breath sounds: Normal breath sounds.  Musculoskeletal:        General: Normal range of motion.  Skin:    General: Skin is warm and dry.     Findings: Rash present. Rash is urticarial.     Comments: Systemic urticarial rash with excoriation to arms, chest, breasts, abdomen, legs, face, and back.   Neurological:     General: No focal deficit present.     Mental Status: She is alert and oriented to person, place, and time.  Psychiatric:        Mood and Affect: Mood normal.        Behavior: Behavior normal. Behavior is cooperative.      UC Treatments / Results  Labs (all labs ordered are listed, but only abnormal results are displayed) Labs Reviewed - No data to display  EKG   Radiology No results found.  Procedures Procedures (including critical care time)  Medications Ordered in UC Medications  dexamethasone (DECADRON) injection 10 mg (has no administration in time range)    Initial Impression / Assessment and Plan / UC Course  I have reviewed the triage vital signs and the nursing notes.  Pertinent labs & imaging results that were available during my care of the patient were reviewed by me and considered in my medical decision making (see chart for details).  Vitals and triage reviewed, patient  is hemodynamically stable.  Urticarial rash to full body with excoriation.  Suspect due to recent nut intake.  Lungs are vesicular, no oral swelling.  Will treat with  IM steroids and taper, antihistamine discussed if needed.  Does not have any acute blood loss or current dizziness, encouraged to follow-up with PCP regarding dizziness/hemoglobin checking.  Plan of care, follow-up care return precautions given, no questions at this time.     Final Clinical Impressions(s) / UC Diagnoses   Final diagnoses:  Allergic reaction, initial encounter  Rash and nonspecific skin eruption     Discharge Instructions      ???? ???? ???????? ?? ????????. ??? ???????? ???? ?? ??????????? ?? ???????? ??????? ????? ?? ????? ???? ??????????? ??????? ???? ?? ???? ???????. ????? ??????? ???????? ?? 6 ????? ??? ??? ??????? ?? ???? ??? ?????? ?????? ????????.  ???? ???????? ?? ???? ??????? ??????? ????? ?? ???? ????? ??????? ????????/??????? ???????. ?????? ??? ??????? ??? ????? ????? ?? ?????.  Please avoid nuts in the future.  We have given her an injection of steroids in clinic, she can start the steroid taper pack tomorrow with breakfast.  You can use Benadryl every 6 hours for any breakthrough itching, this medication may cause drowsiness and sedation.  Please follow-up with your primary care provider regarding allergy testing/further evaluation.  Return to clinic for any new or urgent symptoms.     ED Prescriptions     Medication Sig Dispense Auth. Provider   predniSONE (STERAPRED UNI-PAK 21 TAB) 10 MG (21) TBPK tablet Take as prescribed. 21 tablet Rinaldo Ratel, [place] N, [place]   diphenhydrAMINE (BENADRYL) 25 MG tablet Take 1 tablet (25 mg total) by mouth every 6 (six) hours as needed. 30 tablet Marguis Mathieson, [place] N, [place]      PDMP not reviewed this encounter.   Jamone Garrido, [place] N, [place] 09/23/23 [phone]

## 2023-09-23 NOTE — ED Triage Notes (Signed)
Pt ate something with nuts in it two days ago and having itching all over. Hasn't taken any medications for symptoms.   Pt having intermittent dizziness for about 4 days. Not happening everyday.

## 2023-09-23 NOTE — Discharge Instructions (Addendum)
???? ???? ???????? ?? ????????. ??? ???????? ???? ?? ??????????? ?? ???????? ??????? ????? ?? ????? ???? ??????????? ??????? ???? ?? ???? ???????. ????? ??????? ???????? ??   6 ????? ??? ??? ??????? ?? ???? ??? ?????? ?????? ????????.  ???? ???????? ?? ???? ??????? ??????? ????? ?? ???? ????? ??????? ????????/??????? ???????. ?????? ??? ??????? ??? ????? ????? ?? ?????.  Please avoid nuts in the future.  We have given her an injection of steroids in clinic, she can start the steroid taper pack tomorrow with breakfast.  You can use Benadryl every 6 hours for any breakthrough itching, this medication may cause drowsiness and sedation.  Please follow-up with your primary care provider regarding allergy testing/further evaluation.  Return to clinic for any new or urgent symptoms.

## 2023-09-26 ENCOUNTER — Encounter (HOSPITAL_COMMUNITY): Payer: Self-pay | Admitting: *Deleted

## 2023-09-26 ENCOUNTER — Encounter (HOSPITAL_BASED_OUTPATIENT_CLINIC_OR_DEPARTMENT_OTHER): Payer: Self-pay

## 2023-09-26 ENCOUNTER — Ambulatory Visit (HOSPITAL_COMMUNITY)
Admission: EM | Admit: 2023-09-26 | Discharge: 2023-09-26 | Disposition: A | Discharge status: Home / Self Care | Payer: Medicaid Other

## 2023-09-26 ENCOUNTER — Other Ambulatory Visit: Payer: Self-pay

## 2023-09-26 DIAGNOSIS — L509 Urticaria, unspecified: Secondary | ICD-10-CM

## 2023-09-26 DIAGNOSIS — I776 Arteritis, unspecified: Secondary | ICD-10-CM | POA: Insufficient documentation

## 2023-09-26 DIAGNOSIS — D692 Other nonthrombocytopenic purpura: Secondary | ICD-10-CM | POA: Insufficient documentation

## 2023-09-26 DIAGNOSIS — R21 Rash and other nonspecific skin eruption: Secondary | ICD-10-CM

## 2023-09-26 NOTE — ED Notes (Signed)
Patient is being discharged from the Urgent Care and sent to the Emergency Department via POV . Per Doristine Mango, [place], patient is in need of higher level of care due to allergic reaction. Patient is aware and verbalizes understanding of plan of care.  Vitals:   09/26/23 1634  BP: 127/73  Pulse: 87  Resp: 18  Temp: 97.9 F (36.6 C)  SpO2: 97%

## 2023-09-26 NOTE — ED Triage Notes (Signed)
Pts daughter states that allergic reaction from 1/16/224 isn't any better with meds given and she is now having abdominal pain as well. She has a rash all over her body. She states she is SOB sometimes. Daughter states she stopped benadryl and prednisone yesterday since they weren't working.

## 2023-09-26 NOTE — Discharge Instructions (Signed)
Discharge to the ER for further evaluation

## 2023-09-26 NOTE — ED Provider Notes (Signed)
[facility]    CSN: [id number] Arrival date & time: 09/26/23  1506      History   Chief Complaint Chief Complaint  Patient presents with   Allergic Reaction    HPI Amy Flowers is a 57 y.o. female.   Relates no improvement so stopped the steroids yesterday, also relates steroids were causing her stomach to ache. Still having SOA like previous, has not scheduled with PCP yet. No previous reaction to nuts and has had them many times through the years. Having body wide itching. Rash all over body. Denies any new soaps or products at home. No yard work. No new medications, no blood thinners, no h/o vasculitis.    Allergic Reaction Presenting symptoms: rash     Past Medical History:  Diagnosis Date   Aortic atherosclerosis (HCC) 06/26/2017   GERD (gastroesophageal reflux disease) 02/14/2019   Hiatal hernia 02/14/2019   Vitamin D deficiency 09/28/2017    Patient Active Problem List   Diagnosis Date Noted   Esophageal motility disorder 02/14/2019   GERD (gastroesophageal reflux disease) 02/14/2019   Hiatal hernia 02/14/2019   Dysphagia 02/09/2019   Dyspnea 02/09/2019   Vitamin D deficiency 09/28/2017   Osteoarthritis of both knees 07/01/2017   Aortic atherosclerosis (HCC) 06/26/2017    Class: Diagnosis of    Past Surgical History:  Procedure Laterality Date   APPENDECTOMY      OB History   No obstetric history on file.      Home Medications    Prior to Admission medications   Medication Sig Start Date End Date Taking? Authorizing Provider  diphenhydrAMINE (BENADRYL) 25 MG tablet Take 1 tablet (25 mg total) by mouth every 6 (six) hours as needed. 09/23/23  Yes Rinaldo Ratel, [place] N, FNP  predniSONE (STERAPRED UNI-PAK 21 TAB) 10 MG (21) TBPK tablet Take as prescribed. 09/23/23  Yes Rinaldo Ratel, [place] N, FNP  acetaminophen (TYLENOL) 325 MG tablet Take 2 tablets (650 mg total) by mouth every 6 (six) hours as needed. 09/10/23   Wynonia Lawman A, NP  meloxicam  (MOBIC) 7.5 MG tablet Take 1 tablet (7.5 mg total) by mouth daily. Patient not taking: Reported on 02/25/2022 08/13/17   Hoy Register, MD  methylPREDNISolone (MEDROL DOSEPAK) 4 MG TBPK tablet Take 24 mg on day 1, 20 mg on day 2, 16 mg on day 3, 12 mg on day 4, 8 mg on day 5, 4 mg on day 6. Patient not taking: Reported on 02/25/2022 01/09/22   Theadora Rama Scales, PA-C  Vitamin D, Ergocalciferol, (DRISDOL) 50000 units CAPS capsule Take 1 capsule (50,000 Units total) by mouth every 7 (seven) days. Patient not taking: Reported on 02/25/2022 09/29/17   Hoy Register, MD    Family History History reviewed. No pertinent family history.  Social History Social History   Tobacco Use   Smoking status: Never   Smokeless tobacco: Never  Vaping Use   Vaping status: Never Used  Substance Use Topics   Alcohol use: No   Drug use: No     Allergies   Patient has no known allergies.   Review of Systems Review of Systems  Constitutional:  Negative for chills and fever.  HENT:  Negative for ear pain and sore throat.   Eyes:  Negative for pain and visual disturbance.  Respiratory:  Negative for cough and shortness of breath.   Cardiovascular:  Negative for chest pain and palpitations.  Gastrointestinal:  Negative for abdominal pain and vomiting.  Genitourinary:  Negative for dysuria and  hematuria.  Musculoskeletal:  Negative for arthralgias and back pain.  Skin:  Positive for color change and rash.       Severe urticaria  Neurological:  Negative for seizures and syncope.  All other systems reviewed and are negative.    Physical Exam Triage Vital Signs ED Triage Vitals  Encounter Vitals Group     BP 09/26/23 1634 127/73     Systolic BP Percentile --      Diastolic BP Percentile --      Pulse Rate 09/26/23 1634 87     Resp 09/26/23 1634 18     Temp 09/26/23 1634 97.9 F (36.6 C)     Temp Source 09/26/23 1634 Oral     SpO2 09/26/23 1634 97 %     Weight --      Height --      Head  Circumference --      Peak Flow --      Pain Score 09/26/23 1633 0     Pain Loc --      Pain Education --      Exclude from Growth Chart --    No data found.  Updated Vital Signs BP 127/73 (BP Location: Left Arm)   Pulse 87   Temp 97.9 F (36.6 C) (Oral)   Resp 18   SpO2 97%   Visual Acuity Right Eye Distance:   Left Eye Distance:   Bilateral Distance:    Right Eye Near:   Left Eye Near:    Bilateral Near:     Physical Exam Vitals and nursing note reviewed.  Constitutional:      General: She is not in acute distress.    Appearance: She is well-developed.  HENT:     Head: Normocephalic and atraumatic.  Eyes:     Conjunctiva/sclera: Conjunctivae normal.  Cardiovascular:     Rate and Rhythm: Normal rate and regular rhythm.     Heart sounds: No murmur heard. Pulmonary:     Effort: Pulmonary effort is normal. No respiratory distress.     Breath sounds: Normal breath sounds.  Abdominal:     Palpations: Abdomen is soft.     Tenderness: There is no abdominal tenderness.  Musculoskeletal:        General: No swelling.     Cervical back: Neck supple.  Skin:    General: Skin is warm and dry.     Capillary Refill: Capillary refill takes less than 2 seconds.     Findings: Erythema and rash present.     Comments: See media  Neurological:     Mental Status: She is alert.  Psychiatric:        Mood and Affect: Mood normal.             UC Treatments / Results  Labs (all labs ordered are listed, but only abnormal results are displayed) Labs Reviewed - No data to display  EKG   Radiology No results found.  Procedures Procedures (including critical care time)  Medications Ordered in UC Medications - No data to display  Initial Impression / Assessment and Plan / UC Course  I have reviewed the triage vital signs and the nursing notes.  Pertinent labs & imaging results that were available during my care of the patient were reviewed by me and considered in  my medical decision making (see chart for details).     Urticaria  Rash   Long discussion with the patient and her daughter.  Given the severity  of the rash as well as the shortness of breath they would like to have more advanced evaluation which I explained can be done at the emergency room.  They will go there for further evaluation. Final Clinical Impressions(s) / UC Diagnoses   Final diagnoses:  Urticaria  Rash   Discharge Instructions   None    ED Prescriptions   None    PDMP not reviewed this encounter.   Landis Martins, [place] 09/26/23 1741

## 2023-09-26 NOTE — ED Triage Notes (Signed)
Pt presents with pruritic rash x 6 days. Pt has been evaluated at [facility] and dx with a nut allergy. Pt was given a steroid injection and rx a steroid taper and benadryl. Pt stopped taking the steroid  after 2 days because the pt was having stomach upset.

## 2023-09-27 ENCOUNTER — Emergency Department (HOSPITAL_BASED_OUTPATIENT_CLINIC_OR_DEPARTMENT_OTHER): Payer: Medicaid Other

## 2023-09-27 ENCOUNTER — Emergency Department (HOSPITAL_BASED_OUTPATIENT_CLINIC_OR_DEPARTMENT_OTHER)
Admission: EM | Admit: 2023-09-27 | Discharge: 2023-09-27 | Disposition: A | Discharge status: Home / Self Care | Payer: Medicaid Other | Attending: Emergency Medicine | Admitting: Emergency Medicine

## 2023-09-27 DIAGNOSIS — D692 Other nonthrombocytopenic purpura: Secondary | ICD-10-CM

## 2023-09-27 DIAGNOSIS — I776 Arteritis, unspecified: Secondary | ICD-10-CM

## 2023-09-27 LAB — URINALYSIS, ROUTINE W REFLEX MICROSCOPIC
Bacteria, UA: NONE SEEN
Bilirubin Urine: NEGATIVE
Glucose, UA: NEGATIVE mg/dL
Hgb urine dipstick: NEGATIVE
Ketones, ur: NEGATIVE mg/dL
Nitrite: NEGATIVE
Protein, ur: NEGATIVE mg/dL
Specific Gravity, Urine: 1.012 (ref 1.005–1.030)
pH: 6.5 (ref 5.0–8.0)

## 2023-09-27 LAB — CBC WITH DIFFERENTIAL/PLATELET
Abs Immature Granulocytes: 0.04 10*3/uL (ref 0.00–0.07)
Basophils Absolute: 0 10*3/uL (ref 0.0–0.1)
Basophils Relative: 0 %
Eosinophils Absolute: 0.1 10*3/uL (ref 0.0–0.5)
Eosinophils Relative: 1 %
HCT: 40.2 % (ref 36.0–46.0)
Hemoglobin: 13.8 g/dL (ref 12.0–15.0)
Immature Granulocytes: 1 %
Lymphocytes Relative: 34 %
Lymphs Abs: 2.9 10*3/uL (ref 0.7–4.0)
MCH: 31.3 pg (ref 26.0–34.0)
MCHC: 34.3 g/dL (ref 30.0–36.0)
MCV: 91.2 fL (ref 80.0–100.0)
Monocytes Absolute: 0.3 10*3/uL (ref 0.1–1.0)
Monocytes Relative: 4 %
Neutro Abs: 5.3 10*3/uL (ref 1.7–7.7)
Neutrophils Relative %: 60 %
Platelets: 197 10*3/uL (ref 150–400)
RBC: 4.41 MIL/uL (ref 3.87–5.11)
RDW: 13.3 % (ref 11.5–15.5)
WBC: 8.7 10*3/uL (ref 4.0–10.5)
nRBC: 0 % (ref 0.0–0.2)

## 2023-09-27 LAB — COMPREHENSIVE METABOLIC PANEL
ALT: 18 U/L (ref 0–44)
AST: 21 U/L (ref 15–41)
Albumin: 3.6 g/dL (ref 3.5–5.0)
Alkaline Phosphatase: 49 U/L (ref 38–126)
Anion gap: 7 (ref 5–15)
BUN: 11 mg/dL (ref 6–20)
CO2: 25 mmol/L (ref 22–32)
Calcium: 8.8 mg/dL — ABNORMAL LOW (ref 8.9–10.3)
Chloride: 105 mmol/L (ref 98–111)
Creatinine, Ser: 0.66 mg/dL (ref 0.44–1.00)
GFR, Estimated: >60 mL/min (ref >60)
Glucose, Bld: 109 mg/dL — ABNORMAL HIGH (ref 70–99)
Potassium: 4 mmol/L (ref 3.5–5.1)
Sodium: 137 mmol/L (ref 135–145)
Total Bilirubin: 0.7 mg/dL (ref 0.3–1.2)
Total Protein: 6.4 g/dL — ABNORMAL LOW (ref 6.5–8.1)

## 2023-09-27 LAB — LIPASE, BLOOD: Lipase: 62 U/L — ABNORMAL HIGH (ref 11–51)

## 2023-09-27 LAB — SEDIMENTATION RATE: Sed Rate: 18 mm/h (ref 0–22)

## 2023-09-27 LAB — C-REACTIVE PROTEIN: CRP: 1 mg/dL — ABNORMAL HIGH (ref <1.0)

## 2023-09-27 LAB — CK: Total CK: 27 U/L — ABNORMAL LOW (ref 38–234)

## 2023-09-27 MED ORDER — PREDNISONE 50 MG PO TABS: ORAL_TABLET | ORAL | 0 refills | Status: DC

## 2023-09-27 MED ORDER — DIPHENHYDRAMINE HCL 25 MG PO TABS: 25.0000 mg | ORAL_TABLET | Freq: Four times a day (QID) | ORAL | 0 refills | Status: DC | PRN

## 2023-09-27 MED ORDER — FAMOTIDINE IN NACL 20-0.9 MG/50ML-% IV SOLN
20.0000 mg | Freq: Once | INTRAVENOUS | Status: AC
  Administered 2023-09-27: 20 mg via INTRAVENOUS
  Filled 2023-09-27: qty 50

## 2023-09-27 MED ORDER — METHYLPREDNISOLONE SODIUM SUCC 125 MG IJ SOLR
125.0000 mg | Freq: Once | INTRAMUSCULAR | Status: AC
  Administered 2023-09-27: 125 mg via INTRAVENOUS
  Filled 2023-09-27: qty 2

## 2023-09-27 MED ORDER — OMEPRAZOLE 20 MG PO CPDR: 20.0000 mg | DELAYED_RELEASE_CAPSULE | Freq: Every day | ORAL | 0 refills | Status: DC

## 2023-09-27 MED ORDER — IOHEXOL 300 MG/ML  SOLN
100.0000 mL | Freq: Once | INTRAMUSCULAR | Status: AC | PRN
  Administered 2023-09-27: 100 mL via INTRAVENOUS

## 2023-09-27 MED ORDER — DIPHENHYDRAMINE HCL 50 MG/ML IJ SOLN
25.0000 mg | Freq: Once | INTRAMUSCULAR | Status: AC
Start: 2023-09-27 — End: 2023-09-27
  Administered 2023-09-27: 25 mg via INTRAVENOUS
  Filled 2023-09-27: qty 1

## 2023-09-27 NOTE — Discharge Instructions (Addendum)
Your testing is reassuring.  We suspect you could have some kind of vasculitis causing your rash.  You should follow-up with a dermatologist for further evaluation. Take the steroids as prescribed.  Follow-up with your doctor.  Return to the ED with worsening pain, difficulty breathing, fever, or other concerns.

## 2023-09-27 NOTE — ED Provider Notes (Signed)
[place] EMERGENCY DEPARTMENT AT [facility] Provider Note   CSN: [id number] Arrival date & time: 09/26/23  1949     History  Chief Complaint  Patient presents with   Rash    Amy Flowers is a 57 y.o. female.  Patient here with daughter who provides translation.  He declined medical interpreter.  She has a itchy rash to her legs, trunk, arms for the past 6 days.  This started after eating nuts and was diagnosis potential urticaria by urgent care.  She was given a steroid injection and a steroid prescription.  This was stopped 2 days ago because of abdominal pain and nausea.  They went back to urgent care today were sent to the ED with concern for possible vasculitis.  Patient describes itchy blotchy rash to her bilateral legs, arms, trunk, back.  No genital or oral lesions.  No one else with similar rash.  No difficulty breathing or difficulty swallowing.  No fever.  Denies any regular prescription medications. Did travel to [place] about 2 months ago.  Has been exposed to cats.  No history of vasculitis in the past. No tongue swelling or lip swelling.  No chest pain or shortness of breath.  No fever.  The history is provided by the patient and a relative. The history is limited by a language barrier. A language interpreter was used.  Rash Associated symptoms: abdominal pain and nausea   Associated symptoms: no fever and no headaches        Home Medications Prior to Admission medications   Medication Sig Start Date End Date Taking? Authorizing Provider  acetaminophen (TYLENOL) 325 MG tablet Take 2 tablets (650 mg total) by mouth every 6 (six) hours as needed. 09/10/23   Wynonia Lawman A, NP  diphenhydrAMINE (BENADRYL) 25 MG tablet Take 1 tablet (25 mg total) by mouth every 6 (six) hours as needed. 09/23/23   Garrison, [place] N, FNP  meloxicam (MOBIC) 7.5 MG tablet Take 1 tablet (7.5 mg total) by mouth daily. Patient not taking: Reported on 02/25/2022 08/13/17   Hoy Register, MD  methylPREDNISolone (MEDROL DOSEPAK) 4 MG TBPK tablet Take 24 mg on day 1, 20 mg on day 2, 16 mg on day 3, 12 mg on day 4, 8 mg on day 5, 4 mg on day 6. Patient not taking: Reported on 02/25/2022 01/09/22   Theadora Rama Scales, PA-C  predniSONE (STERAPRED UNI-PAK 21 TAB) 10 MG (21) TBPK tablet Take as prescribed. 09/23/23   Garrison, [place] N, FNP  Vitamin D, Ergocalciferol, (DRISDOL) 50000 units CAPS capsule Take 1 capsule (50,000 Units total) by mouth every 7 (seven) days. Patient not taking: Reported on 02/25/2022 09/29/17   Hoy Register, MD      Allergies    Patient has no known allergies.    Review of Systems   Review of Systems  Constitutional:  Negative for activity change, appetite change and fever.  HENT:  Negative for congestion and postnasal drip.   Respiratory:  Negative for chest tightness.   Cardiovascular:  Negative for chest pain.  Gastrointestinal:  Positive for abdominal pain and nausea.  Genitourinary:  Negative for dysuria and hematuria.  Musculoskeletal:  Negative for back pain.  Skin:  Positive for rash.  Neurological:  Negative for dizziness, weakness and headaches.   all other systems are negative except as noted in the HPI and PMH.    Physical Exam Updated Vital Signs BP 124/71   Pulse 88   Temp 98.1 F (36.7 C) (  Oral)   Resp 20   Ht 5\' 1"  (1.549 m)   Wt 70 kg   SpO2 100%   BMI 29.16 kg/m  Physical Exam Vitals and nursing note reviewed.  Constitutional:      General: She is not in acute distress.    Appearance: She is well-developed.  HENT:     Head: Normocephalic and atraumatic.     Mouth/Throat:     Pharynx: No oropharyngeal exudate.     Comments: No tongue or lip swelling Eyes:     Conjunctiva/sclera: Conjunctivae normal.     Pupils: Pupils are equal, round, and reactive to light.  Neck:     Comments: No meningismus. Cardiovascular:     Rate and Rhythm: Normal rate and regular rhythm.     Heart sounds: Normal heart  sounds. No murmur heard. Pulmonary:     Effort: Pulmonary effort is normal. No respiratory distress.     Breath sounds: Normal breath sounds.  Abdominal:     Palpations: Abdomen is soft.     Tenderness: There is abdominal tenderness. There is no guarding or rebound.     Comments: Periumbilical tenderness, no guarding or rebound  Musculoskeletal:        General: No tenderness. Normal range of motion.     Cervical back: Normal range of motion and neck supple.  Skin:    General: Skin is warm.     Findings: Rash present.     Comments: Blotchy red rash involving bilateral arms, legs, back, trunk. Violaceous patches that are nonblanchable.  Other areas are more pink and red.  Intact distal pulses  Neurological:     Mental Status: She is alert and oriented to person, place, and time.     Cranial Nerves: No cranial nerve deficit.     Motor: No abnormal muscle tone.     Coordination: Coordination normal.     Comments:  5/5 strength throughout. CN 2-12 intact.Equal grip strength.   Psychiatric:        Behavior: Behavior normal.            ED Results / Procedures / Treatments   Labs (all labs ordered are listed, but only abnormal results are displayed) Labs Reviewed  COMPREHENSIVE METABOLIC PANEL - Abnormal; Notable for the following components:      Result Value   Glucose, Bld 109 (*)    Calcium 8.8 (*)    Total Protein 6.4 (*)    All other components within normal limits  CK - Abnormal; Notable for the following components:   Total CK 27 (*)    All other components within normal limits  URINALYSIS, ROUTINE W REFLEX MICROSCOPIC - Abnormal; Notable for the following components:   Color, Urine COLORLESS (*)    Leukocytes,Ua TRACE (*)    All other components within normal limits  LIPASE, BLOOD - Abnormal; Notable for the following components:   Lipase 62 (*)    All other components within normal limits  CBC WITH DIFFERENTIAL/PLATELET  SEDIMENTATION RATE  C-REACTIVE  PROTEIN  ANTINUCLEAR ANTIBODIES, IFA    EKG None  Radiology CT ABDOMEN PELVIS W CONTRAST  Result Date: 09/27/2023 CLINICAL DATA:  57 year old female presenting with acute onset of abdominal pain. Pruritic rash. EXAM: CT ABDOMEN AND PELVIS WITH CONTRAST TECHNIQUE: Multidetector CT imaging of the abdomen and pelvis was performed using the standard protocol following bolus administration of intravenous contrast. RADIATION DOSE REDUCTION: This exam was performed according to the departmental dose-optimization program which includes automated exposure  control, adjustment of the mA and/or kV according to patient size and/or use of iterative reconstruction technique. CONTRAST:  OMNIPAQUE IOHEXOL 300 MG/ML  SOLN COMPARISON:  CT of the abdomen and pelvis 06/20/2022. FINDINGS: Lower chest: Unremarkable. Hepatobiliary: No suspicious cystic or solid hepatic lesions. No intra or extrahepatic biliary ductal dilatation. Gallbladder is unremarkable in appearance. Pancreas: No pancreatic mass. No pancreatic ductal dilatation. No pancreatic or peripancreatic fluid collections or inflammatory changes. Spleen: Unremarkable. Adrenals/Urinary Tract: Bilateral kidneys and adrenal glands are normal in appearance. No hydroureteronephrosis. Urinary bladder wall appears diffusely thickened, similar to the prior study. Down Stomach/Bowel: The appearance of the stomach is normal. There is no pathologic dilatation of small bowel or colon. The appendix is not confidently identified and may be surgically absent. Regardless, there are no inflammatory changes noted adjacent to the cecum to suggest the presence of an acute appendicitis at this time. Vascular/Lymphatic: No significant atherosclerotic disease, aneurysm or dissection noted in the abdominal or pelvic vasculature. No lymphadenopathy noted in the abdomen or pelvis. Reproductive: Uterus and ovaries are unremarkable in appearance. Other: Trace volume of free fluid in the low  anatomic pelvis, greater than expected for a postmenopausal female. No pneumoperitoneum. Musculoskeletal: There are no aggressive appearing lytic or blastic lesions noted in the visualized portions of the skeleton. IMPRESSION: 1. Diffuse bladder wall thickening again noted, concerning for probable cystitis. 2. Small volume of ascites in the low anatomic pelvis, potentially reactive. This is abnormal in a postmenopausal female. 3. Additional incidental findings, as above. Electronically Signed   By: Trudie Reed M.D.   On: 09/27/2023 05:37    Procedures Procedures    Medications Ordered in ED Medications - No data to display  ED Course/ Medical Decision Making/ A&P                                 Medical Decision Making Amount and/or Complexity of Data Reviewed Labs: ordered. Decision-making details documented in ED Course. Radiology: ordered and independent interpretation performed. Decision-making details documented in ED Course. ECG/medicine tests: ordered and independent interpretation performed. Decision-making details documented in ED Course.  Risk OTC drugs. Prescription drug management.   6 days of blotchy red rash seen by urgent care with concern for urticaria.  No tongue swelling or lip swelling.  No difficulty breathing.  No chest pain or shortness of breath.  Presentation not consistent with urticaria.  Purple purpura likely consistent with vasculitis  Patient given dose of IV steroids and antihistamines.  Labs will be obtained to evaluate for thrombocytopenia, LFT abnormalities or kidney function abnormalities.  Platelet count is normal.  LFTs are normal, kidney function is normal.  CK is normal.  Unclear etiology of rash but may be due to some type of vasculitis.  ESR is normal.  CRP and ANA pending.  Will refer to dermatology as well as PCP. Urine also is negative for infection.  Return precautions discussed.        Final Clinical Impression(s) / ED  Diagnoses Final diagnoses:  Purpura (HCC)  Vasculitis (HCC)    Rx / DC Orders ED Discharge Orders     None         Roe Koffman, Jeannett Senior, MD 09/27/23 [phone]

## 2023-09-28 ENCOUNTER — Other Ambulatory Visit: Payer: Self-pay

## 2023-09-28 ENCOUNTER — Encounter (HOSPITAL_COMMUNITY): Payer: Self-pay

## 2023-09-28 ENCOUNTER — Inpatient Hospital Stay (HOSPITAL_COMMUNITY)
Admission: EM | Admit: 2023-09-28 | Discharge: 2023-10-03 | DRG: 813 | Disposition: A | Discharge status: Home / Self Care | Payer: MEDICAID | Attending: Internal Medicine | Admitting: Internal Medicine

## 2023-09-28 DIAGNOSIS — K224 Dyskinesia of esophagus: Secondary | ICD-10-CM | POA: Diagnosis present

## 2023-09-28 DIAGNOSIS — R55 Syncope and collapse: Secondary | ICD-10-CM | POA: Diagnosis present

## 2023-09-28 DIAGNOSIS — T7840XA Allergy, unspecified, initial encounter: Principal | ICD-10-CM

## 2023-09-28 DIAGNOSIS — Z791 Long term (current) use of non-steroidal anti-inflammatories (NSAID): Secondary | ICD-10-CM

## 2023-09-28 DIAGNOSIS — E559 Vitamin D deficiency, unspecified: Secondary | ICD-10-CM | POA: Diagnosis present

## 2023-09-28 DIAGNOSIS — T783XXA Angioneurotic edema, initial encounter: Secondary | ICD-10-CM

## 2023-09-28 DIAGNOSIS — R0602 Shortness of breath: Secondary | ICD-10-CM | POA: Diagnosis present

## 2023-09-28 DIAGNOSIS — E876 Hypokalemia: Secondary | ICD-10-CM

## 2023-09-28 DIAGNOSIS — K219 Gastro-esophageal reflux disease without esophagitis: Secondary | ICD-10-CM | POA: Diagnosis present

## 2023-09-28 DIAGNOSIS — M26643 Arthritis of bilateral temporomandibular joint: Secondary | ICD-10-CM | POA: Diagnosis present

## 2023-09-28 DIAGNOSIS — D692 Other nonthrombocytopenic purpura: Principal | ICD-10-CM | POA: Diagnosis present

## 2023-09-28 DIAGNOSIS — T783XXD Angioneurotic edema, subsequent encounter: Secondary | ICD-10-CM

## 2023-09-28 DIAGNOSIS — T7849XA Other allergy, initial encounter: Secondary | ICD-10-CM | POA: Diagnosis present

## 2023-09-28 DIAGNOSIS — R1310 Dysphagia, unspecified: Secondary | ICD-10-CM | POA: Diagnosis present

## 2023-09-28 DIAGNOSIS — K148 Other diseases of tongue: Secondary | ICD-10-CM | POA: Diagnosis present

## 2023-09-28 DIAGNOSIS — T782XXA Anaphylactic shock, unspecified, initial encounter: Secondary | ICD-10-CM | POA: Diagnosis present

## 2023-09-28 DIAGNOSIS — K449 Diaphragmatic hernia without obstruction or gangrene: Secondary | ICD-10-CM | POA: Diagnosis present

## 2023-09-28 DIAGNOSIS — I776 Arteritis, unspecified: Secondary | ICD-10-CM

## 2023-09-28 DIAGNOSIS — D72829 Elevated white blood cell count, unspecified: Secondary | ICD-10-CM | POA: Diagnosis present

## 2023-09-28 LAB — BASIC METABOLIC PANEL
Anion gap: 7 (ref 5–15)
BUN: 11 mg/dL (ref 6–20)
CO2: 26 mmol/L (ref 22–32)
Calcium: 8.5 mg/dL — ABNORMAL LOW (ref 8.9–10.3)
Chloride: 107 mmol/L (ref 98–111)
Creatinine, Ser: 0.72 mg/dL (ref 0.44–1.00)
GFR, Estimated: >60 mL/min (ref >60)
Glucose, Bld: 60 mg/dL — ABNORMAL LOW (ref 70–99)
Potassium: 3.1 mmol/L — ABNORMAL LOW (ref 3.5–5.1)
Sodium: 140 mmol/L (ref 135–145)

## 2023-09-28 LAB — CBC
HCT: 40.9 % (ref 36.0–46.0)
Hemoglobin: 13.9 g/dL (ref 12.0–15.0)
MCH: 31.6 pg (ref 26.0–34.0)
MCHC: 34 g/dL (ref 30.0–36.0)
MCV: 93 fL (ref 80.0–100.0)
Platelets: 250 10*3/uL (ref 150–400)
RBC: 4.4 MIL/uL (ref 3.87–5.11)
RDW: 13.2 % (ref 11.5–15.5)
WBC: 11.8 10*3/uL — ABNORMAL HIGH (ref 4.0–10.5)
nRBC: 0 % (ref 0.0–0.2)

## 2023-09-28 LAB — ANTINUCLEAR ANTIBODIES, IFA: ANA Ab, IFA: NEGATIVE

## 2023-09-28 MED ORDER — FAMOTIDINE IN NACL 20-0.9 MG/50ML-% IV SOLN
20.0000 mg | INTRAVENOUS | Status: DC
  Administered 2023-09-28 – 2023-09-30 (×2): 20 mg via INTRAVENOUS
  Filled 2023-09-28 (×3): qty 50

## 2023-09-28 MED ORDER — METHYLPREDNISOLONE SODIUM SUCC 125 MG IJ SOLR
60.0000 mg | Freq: Two times a day (BID) | INTRAMUSCULAR | Status: DC
  Administered 2023-09-28 – 2023-09-29 (×3): 60 mg via INTRAVENOUS
  Filled 2023-09-28 (×3): qty 2

## 2023-09-28 MED ORDER — POTASSIUM CHLORIDE 10 MEQ/100ML IV SOLN
10.0000 meq | INTRAVENOUS | Status: AC
  Administered 2023-09-28 – 2023-09-29 (×2): 10 meq via INTRAVENOUS
  Filled 2023-09-28 (×2): qty 100

## 2023-09-28 MED ORDER — DIPHENHYDRAMINE HCL 25 MG PO CAPS
25.0000 mg | ORAL_CAPSULE | Freq: Once | ORAL | Status: AC
  Administered 2023-09-28: 25 mg via ORAL
  Filled 2023-09-28: qty 1

## 2023-09-28 MED ORDER — EPINEPHRINE 0.3 MG/0.3ML IJ SOAJ
0.3000 mg | INTRAMUSCULAR | 0 refills | Status: DC | PRN
Start: 2023-09-28 — End: 2023-10-03

## 2023-09-28 MED ORDER — EPINEPHRINE 0.3 MG/0.3ML IJ SOAJ
0.3000 mg | Freq: Once | INTRAMUSCULAR | Status: AC
  Administered 2023-09-28: 0.3 mg via INTRAMUSCULAR
  Filled 2023-09-28: qty 0.3

## 2023-09-28 MED ORDER — DIPHENHYDRAMINE HCL 50 MG/ML IJ SOLN
25.0000 mg | Freq: Once | INTRAMUSCULAR | Status: AC
Start: 2023-09-28 — End: 2023-09-28
  Administered 2023-09-28: 25 mg via INTRAVENOUS
  Filled 2023-09-28: qty 1

## 2023-09-28 MED ORDER — FAMOTIDINE 20 MG PO TABS
20.0000 mg | ORAL_TABLET | Freq: Once | ORAL | Status: AC
  Administered 2023-09-28: 20 mg via ORAL
  Filled 2023-09-28: qty 1

## 2023-09-28 MED ORDER — DIPHENHYDRAMINE HCL 50 MG/ML IJ SOLN
25.0000 mg | Freq: Four times a day (QID) | INTRAMUSCULAR | Status: DC | PRN
  Administered 2023-09-29 (×2): 25 mg via INTRAVENOUS
  Filled 2023-09-28 (×3): qty 1

## 2023-09-28 NOTE — H&P (Addendum)
History and Physical    Patient: Amy Flowers NWG:[id number] DOB: 11-08-1966 DOA: 09/28/2023 DOS: the patient was seen and examined on 09/28/2023 PCP: Hoy Register, MD  Patient coming from: Home  Chief Complaint:  Chief Complaint  Patient presents with   Allergic Reaction   HPI: Amy Flowers is a 57 y.o. female with medical history significant of esophageal dysmotility, GERD who presents with tongue swelling and throat closing.   She was evaluated in the ED on 09/10/2023 with left-sided jaw pain causing difficulty eating and talking due to pain.  On exam at the time she was noted to have bilateral middle ear effusion without any signs of infection.  She was mildly tender and had pain with movement of left upper jaw.  She was then discharged with recommendation to take Zyrtec and Flonase as well as Tylenol and ibuprofen for pain. She then represented the following day with continual pain and had CT maxillofacial with no acute fracture and showed degenerative arthritis of both TMJs.  10/16-patient presented to urgent care after eating some crushed milk nuts 2-day prior and had broke out in hives.  Had itchy rash throughout her body.  Documented to have urticarial rash to full body with excoriation.  Treated with IM steroid and taper.  1019/-presented to urgent care with no improvement so stopped taking steroids the day prior.  Also related steroids to abdominal pain.  Given severity of the rash she was referred to ED.  In the ED, she was noted to have nonblanchable purpura likely consistent with vasculitis.  She was administered IV steroids and antihistamines.  At the time, platelet, LFT, renal function were normal.  CK was normal.  ESR is normal.  CRP was mildly elevated at 1.  ANA was negative.  Today she presents to ED due to new tongue swelling and difficulty swallowing and breathing. Reports previous jaw pain and headache has resolved.    On arrival to the ED, she was afebrile  normotensive on room air.  CBC with leukocytosis of 11.8 K, normal platelet of 250.  BMP notable for hypokalemia of 3.1, creatinine is normal at 0.72.  Patient speaks Arabic and daughter wanted to translate.  On my evaluation, patient was alert and oriented but complaining of increase tongue swelling and difficulty swallowing since her prior Epi-pen injection. Shortly after she became unresponsive, not responding to sternal rub. Daughter asked her to open her eyes and mouth but patient was not compliant. Her vital signs remained stable at the time without desaturation. Second epi-pen was administered. Patient had just received a second dose of IV Benadryl about 2 hours before.   Case then discussed with PCCM Dr. Gaynell Face and pt at that time became more responsive and was able to open her mouth for examination. Pt comfortable from respiratory stand-point and will be admitted to stepdown unit.   Review of Systems: As mentioned in the history of present illness. All other systems reviewed and are negative. Past Medical History:  Diagnosis Date   Aortic atherosclerosis (HCC) 06/26/2017   GERD (gastroesophageal reflux disease) 02/14/2019   Hiatal hernia 02/14/2019   Vitamin D deficiency 09/28/2017   Past Surgical History:  Procedure Laterality Date   APPENDECTOMY     DIAPHRAGMATIC HERNIA REPAIR     Social History:  reports that she has never smoked. She has never used smokeless tobacco. She reports that she does not drink alcohol and does not use drugs.  No Known Allergies  History reviewed. No pertinent family history.  Prior to Admission medications   Medication Sig Start Date End Date Taking? Authorizing Provider  acetaminophen (TYLENOL) 325 MG tablet Take 2 tablets (650 mg total) by mouth every 6 (six) hours as needed. Patient taking differently: Take 650 mg by mouth every 6 (six) hours as needed for mild pain (pain score 1-3). 09/10/23  Yes Susann Givens, Carley A, NP  cholecalciferol (VITAMIN  D3) 25 MCG (1000 UNIT) tablet Take 1,000 Units by mouth daily.   Yes [provider]  EPINEPHrine 0.3 mg/0.3 mL IJ SOAJ injection Inject 0.3 mg into the muscle as needed for anaphylaxis. 09/28/23  Yes Royanne Foots, DO  Multiple Vitamin (MULTIVITAMIN WITH MINERALS) TABS tablet Take 1 tablet by mouth daily.   Yes [provider]    Physical Exam: Vitals:   09/28/23 2300 09/28/23 2304 09/28/23 2305 09/28/23 2312  BP:      Pulse: 90 89 91 88  Resp: 20 20 17 19   Temp:      TempSrc:      SpO2: 100% 100% 100% 100%  Weight:      Height:       Constitutional: NAD, calm, comfortable, middle age female lying at approximately 20 degree incline in bed Eyes: lids and conjunctivae normal ENMT: Mucous membranes are moist. No lip swelling. No significant tongue edema noted.  Neck: normal, supple Respiratory: clear to auscultation bilaterally, no wheezing, no crackles. Normal respiratory effort. No accessory muscle use.  Cardiovascular: Regular rate and rhythm, no murmurs / rubs / gallops. No extremity edema.   Abdomen: no tenderness, soft Musculoskeletal: no clubbing / cyanosis. No joint deformity upper and lower extremities. Normal muscle tone.  Skin: non-blanchable purpuric rash to bilateral LE Neurologic: CN 2-12 grossly intact.  Psychiatric: Normal judgment and insight. Alert and oriented x 3. Normal mood.   Data Reviewed:  See HPI  Assessment and Plan: * Anaphylaxis -pt with about a week of vasculitic rash on steroids presents with angioedema and difficulty breathing. She received epi-pen, IV Benadryl and famotidine prior to my evaluation. On my evaluation, pt initially alert but complained of worsening tongue swelling and difficulty swallowing. Then became unresponsive and minimal responsive with noxious stimuli. Second epi-pen and IV solu-medrol were administered. PCCM Dr. Gaynell Face called to come evaluate and patient became more alert and was able to open mouth for exam.  Tongue did not appear edematous. She was otherwise comfortable from respiratory stand-point.  -will admit to SDU and continue to closely monitor respiratory status.  -continue IV solu-medrol q12hr -continue daily famotidine -PRN IV Benadryl    Vasculitis (HCC) -Non-blanchable purpuric rash. Unclear etiology. She had complains of left sided jaw pain earlier this month but has since resolved. Recent CPR only mildly elevated at 1. ESR and ANA negative. Platelet, LFT and renal function normal. Low suspicion for giant cell arteritis. -pt has dermatology appointment scheduled for tomorrow. She ultimately will need biopsy of the rash for more definitive diagnosis.   Hypokalemia -give IV potassium x2      Advance Care Planning:   Code Status: Full Code   Consults: PCCM  Family Communication: daughter at bedside  Severity of Illness: The appropriate patient status for this patient is INPATIENT. Inpatient status is judged to be reasonable and necessary in order to provide the required intensity of service to ensure the patient's safety. The patient's presenting symptoms, physical exam findings, and initial radiographic and laboratory data in the context of their chronic comorbidities is felt to place them at high risk  for further clinical deterioration. Furthermore, it is not anticipated that the patient will be medically stable for discharge from the hospital within 2 midnights of admission.   * I certify that at the point of admission it is my clinical judgment that the patient will require inpatient hospital care spanning beyond 2 midnights from the point of admission due to high intensity of service, high risk for further deterioration and high frequency of surveillance required.*  CRITICAL CARE Performed by: Anselm Jungling   Total critical care time: 50 minutes  Critical care time was exclusive of separately billable procedures and treating other patients.  Critical care was necessary  to treat or prevent imminent or life-threatening deterioration.  Critical care was time spent personally by me on the following activities: development of treatment plan with patient and/or surrogate as well as nursing, discussions with consultants, evaluation of patient's response to treatment, examination of patient, obtaining history from patient or surrogate, ordering and performing treatments and interventions, ordering and review of laboratory studies, ordering and review of radiographic studies, pulse oximetry and re-evaluation of patient's condition.   Author: Anselm Jungling, DO 09/28/2023 11:18 PM  For on call review [email or web address].

## 2023-09-28 NOTE — ED Provider Notes (Signed)
[place] EMERGENCY DEPARTMENT AT [facility] Provider Note   CSN: [id number] Arrival date & time: 09/28/23  1230     History  Chief Complaint  Patient presents with   Allergic Reaction    Amy Flowers is a 57 y.o. Arabic speaking female.  Who presents given concern for allergic reaction.  Patient has been evaluated multiple times in this emergency department for an ongoing rash.  Please see the note from 1019 ED visit.  She states the rash has been getting better over the last 2 days but voices concern for tongue swelling today.  Around 1100 she began to experience tongue swelling, swelling in her cheeks and a scratching sensation in her throat.  No fevers, chills, shortness of breath, drooling or inability to swallow.  History obtained through use of Arabic interpreter.   Allergic Reaction      Home Medications Prior to Admission medications   Medication Sig Start Date End Date Taking? Authorizing Provider  EPINEPHrine 0.3 mg/0.3 mL IJ SOAJ injection Inject 0.3 mg into the muscle as needed for anaphylaxis. 09/28/23  Yes Royanne Foots, DO  acetaminophen (TYLENOL) 325 MG tablet Take 2 tablets (650 mg total) by mouth every 6 (six) hours as needed. 09/10/23   Wynonia Lawman A, NP  diphenhydrAMINE (BENADRYL) 25 MG tablet Take 1 tablet (25 mg total) by mouth every 6 (six) hours as needed. 09/27/23   Rancour, Jeannett Senior, MD  meloxicam (MOBIC) 7.5 MG tablet Take 1 tablet (7.5 mg total) by mouth daily. Patient not taking: Reported on 02/25/2022 08/13/17   Hoy Register, MD  omeprazole (PRILOSEC) 20 MG capsule Take 1 capsule (20 mg total) by mouth daily. 09/27/23   Rancour, Jeannett Senior, MD  predniSONE (DELTASONE) 50 MG tablet 1 tablet PO daily 09/27/23   Rancour, Jeannett Senior, MD  Vitamin D, Ergocalciferol, (DRISDOL) 50000 units CAPS capsule Take 1 capsule (50,000 Units total) by mouth every 7 (seven) days. Patient not taking: Reported on 02/25/2022 09/29/17   Hoy Register, MD       Allergies    Patient has no known allergies.    Review of Systems   Review of Systems  Physical Exam Updated Vital Signs BP 129/64   Pulse 87   Temp 98.3 F (36.8 C) (Oral)   Resp 17   Ht 5\' 1"  (1.549 m)   Wt 70 kg   SpO2 100%   BMI 29.16 kg/m  Physical Exam Vitals and nursing note reviewed.  HENT:     Head: Normocephalic and atraumatic.     Mouth/Throat:     Comments: Mild tongue and face swelling Uvula is midline Airway is patent Patient able to speak in full sentences and tolerating secretions  Eyes:     Pupils: Pupils are equal, round, and reactive to light.  Cardiovascular:     Rate and Rhythm: Normal rate and regular rhythm.  Pulmonary:     Effort: Pulmonary effort is normal.     Breath sounds: Normal breath sounds. No stridor. No wheezing.  Abdominal:     Palpations: Abdomen is soft.     Tenderness: There is no abdominal tenderness.  Skin:    General: Skin is warm and dry.     Comments: Patchy violaceous nonblanching rash over arms legs and trunk  Neurological:     Mental Status: She is alert.  Psychiatric:        Mood and Affect: Mood normal.     ED Results / Procedures / Treatments   Labs (all  labs ordered are listed, but only abnormal results are displayed) Labs Reviewed  BASIC METABOLIC PANEL - Abnormal; Notable for the following components:      Result Value   Potassium 3.1 (*)    Glucose, Bld 60 (*)    Calcium 8.5 (*)    All other components within normal limits  CBC - Abnormal; Notable for the following components:   WBC 11.8 (*)    All other components within normal limits    EKG None  Radiology CT ABDOMEN PELVIS W CONTRAST  Result Date: 09/27/2023 CLINICAL DATA:  57 year old female presenting with acute onset of abdominal pain. Pruritic rash. EXAM: CT ABDOMEN AND PELVIS WITH CONTRAST TECHNIQUE: Multidetector CT imaging of the abdomen and pelvis was performed using the standard protocol following bolus administration of  intravenous contrast. RADIATION DOSE REDUCTION: This exam was performed according to the departmental dose-optimization program which includes automated exposure control, adjustment of the mA and/or kV according to patient size and/or use of iterative reconstruction technique. CONTRAST:  OMNIPAQUE IOHEXOL 300 MG/ML  SOLN COMPARISON:  CT of the abdomen and pelvis 06/20/2022. FINDINGS: Lower chest: Unremarkable. Hepatobiliary: No suspicious cystic or solid hepatic lesions. No intra or extrahepatic biliary ductal dilatation. Gallbladder is unremarkable in appearance. Pancreas: No pancreatic mass. No pancreatic ductal dilatation. No pancreatic or peripancreatic fluid collections or inflammatory changes. Spleen: Unremarkable. Adrenals/Urinary Tract: Bilateral kidneys and adrenal glands are normal in appearance. No hydroureteronephrosis. Urinary bladder wall appears diffusely thickened, similar to the prior study. Down Stomach/Bowel: The appearance of the stomach is normal. There is no pathologic dilatation of small bowel or colon. The appendix is not confidently identified and may be surgically absent. Regardless, there are no inflammatory changes noted adjacent to the cecum to suggest the presence of an acute appendicitis at this time. Vascular/Lymphatic: No significant atherosclerotic disease, aneurysm or dissection noted in the abdominal or pelvic vasculature. No lymphadenopathy noted in the abdomen or pelvis. Reproductive: Uterus and ovaries are unremarkable in appearance. Other: Trace volume of free fluid in the low anatomic pelvis, greater than expected for a postmenopausal female. No pneumoperitoneum. Musculoskeletal: There are no aggressive appearing lytic or blastic lesions noted in the visualized portions of the skeleton. IMPRESSION: 1. Diffuse bladder wall thickening again noted, concerning for probable cystitis. 2. Small volume of ascites in the low anatomic pelvis, potentially reactive. This is  abnormal in a postmenopausal female. 3. Additional incidental findings, as above. Electronically Signed   By: Trudie Reed M.D.   On: 09/27/2023 05:37    Procedures Procedures    Medications Ordered in ED Medications  diphenhydrAMINE (BENADRYL) capsule 25 mg (25 mg Oral Given 09/28/23 1348)  famotidine (PEPCID) tablet 20 mg (20 mg Oral Given 09/28/23 1348)  EPINEPHrine (EPI-PEN) injection 0.3 mg (0.3 mg Intramuscular Given 09/28/23 1516)    ED Course/ Medical Decision Making/ A&P Clinical Course as of 09/28/23 1726  Mon Sep 28, 2023  1724 No recurrence of tongue swelling.  Airway remains patent.  Patient resting comfortably.  Jackquline Bosch DO, am transitioning care of this patient to the oncoming provider pending continued monitoring in the ED to ascertain need for additional dose of epinephrine, reevaluation and disposition [MP]    Clinical Course User Index [MP] Royanne Foots, DO                                 Medical Decision Making 57 year old female with history as  above returns given concern for allergic reaction.  Has been seen multiple times for an ongoing rash but has not yet followed up with dermatology.  States rash has been getting better since ED visit 2 days ago.  Voices concern for tongue swelling and facial swelling today.  Vital signs stable and she is afebrile.  Exam notable for mild swelling of the tongue but airway is intact and she is tolerating secretions.  Given the unclear cause of her tongue swelling with a waxing and waning rash, reasonable concern for allergic reaction/anaphylaxis.  Will provide IM epinephrine and continue to monitor in the ED  Amount and/or Complexity of Data Reviewed Labs: ordered.  Risk Prescription drug management.           Final Clinical Impression(s) / ED Diagnoses Final diagnoses:  Allergic reaction, initial encounter    Rx / DC Orders ED Discharge Orders          Ordered    EPINEPHrine 0.3 mg/0.3 mL IJ  SOAJ injection  As needed        09/28/23 1725              Royanne Foots, DO 09/28/23 1726

## 2023-09-28 NOTE — ED Provider Triage Note (Signed)
Emergency Medicine Provider Triage Evaluation Note  Amy Flowers , a 57 y.o. female  was evaluated in triage.  Pt complains of Allergic reaction x 7 days. Rash, dizzy. Seen multiple times for same and has contacted specialist for appointment which is for tomorrow. This morning she felt her throat and tongue were swelling and her face is swelling. Is on Omeprazole and Prednisone 50 mg.   Review of Systems  Positive: Itching rash (generalized), throat swelling, tongue swelling, hard to swallow. Vomit x 1 today. Negative: No significant SOB  Physical Exam  BP (!) 143/75 (BP Location: Right Arm)   Pulse 86   Temp 98.3 F (36.8 C) (Oral)   Resp 16   Ht 5\' 1"  (1.549 m)   Wt 70 kg   SpO2 100%   BMI 29.16 kg/m  Gen:   Awake, no distress   Resp:  Normal effort No wheezing or stridor MSK:   Moves extremities without difficulty  Other:  Mild facial swelling without redness. No significant tongue swelling. Managing her own secretions.   Medical Decision Making  Medically screening exam initiated at 1:21 PM.  Appropriate orders placed.  Amy Flowers was informed that the remainder of the evaluation will be completed by another provider, this initial triage assessment does not replace that evaluation, and the importance of remaining in the ED until their evaluation is complete.  Benadryl and Pepcid provided in triage.    Elpidio Anis, PA-C 09/28/23 1328

## 2023-09-28 NOTE — Assessment & Plan Note (Signed)
-  give IV potassium x2

## 2023-09-28 NOTE — Assessment & Plan Note (Addendum)
-  pt with about a week of vasculitic rash on steroids presents with angioedema and difficulty breathing. She received epi-pen, IV Benadryl and famotidine prior to my evaluation. On my evaluation, pt initially alert but complained of worsening tongue swelling and difficulty swallowing. Then became unresponsive and minimal responsive with noxious stimuli. Second epi-pen and IV solu-medrol were administered. PCCM Dr. Gaynell Face called to come evaluate and patient became more alert and was able to open mouth for exam. Tongue did not appear edematous. She was otherwise comfortable from respiratory stand-point.  -will admit to SDU and continue to closely monitor respiratory status.  -continue IV solu-medrol q12hr -continue daily famotidine -PRN IV Benadryl

## 2023-09-28 NOTE — ED Notes (Signed)
Per hospitalist, pt c/o tongue being more swollen. Pt voiced no complaints to me when I was administering benadryl.

## 2023-09-28 NOTE — ED Provider Notes (Signed)
  Physical Exam  BP 128/77   Pulse 78   Temp 98.2 F (36.8 C) (Oral)   Resp (!) 22   Ht 5\' 1"  (1.549 m)   Wt 70 kg   SpO2 100%   BMI 29.16 kg/m   Physical Exam  Procedures  Procedures  ED Course / MDM   Clinical Course as of 09/28/23 1941  Mon Sep 28, 2023  1724 No recurrence of tongue swelling.  Airway remains patent.  Patient resting comfortably.  Jackquline Bosch DO, am transitioning care of this patient to the oncoming provider pending continued monitoring in the ED to ascertain need for additional dose of epinephrine, reevaluation and disposition [MP]    Clinical Course User Index [MP] Royanne Foots, DO   Medical Decision Making Amount and/or Complexity of Data Reviewed Labs: ordered.  Risk Prescription drug management.   Patient with rash.  Lip swelling.  Reportedly had some hives previously but now appears more vasculitic appropriate.  Had been itchy.  Reportedly not able to eat.  Did have a small amount of posterior pharynx involvement.  Had been given epinephrine reportedly still is about the same.  With potential airway involvement and potential esophageal involvement I think she would benefit from mission in the hospitals.  Had recently been on steroids for jaw pain and states that made her abdomen painful that she could not take them.  Started on steroids yesterday and got Solu-Medrol and prednisone.  Had been given epinephrine here.         Benjiman Core, MD 09/28/23 1942

## 2023-09-28 NOTE — ED Notes (Signed)
ED TO INPATIENT HANDOFF REPORT  ED Nurse Name and Phone #: [phone]  S Name/Age/Gender Amy Flowers 57 y.o. female Room/Bed: 004C/004C  Code Status   Code Status: Full Code  Home/SNF/Other Home Patient oriented to: self, place, time, and situation Is this baseline? Yes   Triage Complete: Triage complete  Chief Complaint Anaphylaxis [T78.2XXA]  Triage Note Pt c/o allergic reactionx6d. Pt denies new meds or new products. Pt c/o tongue swelling and throat closing. Pt c/o itching. Pt able to maintain secretions. Pt is eupneic. Pt has hives on arms bilat, legs bilat.   Allergies No Known Allergies  Level of Care/Admitting Diagnosis ED Disposition     ED Disposition  Admit   Condition  --   Comment  Hospital Area: [facility] [[id number]]  Level of Care: Progressive [102]  Admit to Progressive based on following criteria: RESPIRATORY PROBLEMS hypoxemic/hypercapnic respiratory failure that is responsive to NIPPV (BiPAP) or High Flow Nasal Cannula (6-80 lpm). Frequent assessment/intervention, no > Q2 hrs < Q4 hrs, to maintain oxygenation and pulmonary hygiene.  May admit patient to [facility] or [facility] if equivalent level of care is available:: No  Covid Evaluation: Asymptomatic - no recent exposure (last 10 days) testing not required  Diagnosis: Anaphylaxis [190363]  Admitting Physician: Anselm Jungling [[id number]]  Attending Physician: Anselm Jungling [[id number]]  Certification:: I certify this patient will need inpatient services for at least 2 midnights  Expected Medical Readiness: 09/30/2023          B Medical/Surgery History Past Medical History:  Diagnosis Date   Aortic atherosclerosis (HCC) 06/26/2017   GERD (gastroesophageal reflux disease) 02/14/2019   Hiatal hernia 02/14/2019   Vitamin D deficiency 09/28/2017   Past Surgical History:  Procedure Laterality Date   APPENDECTOMY     DIAPHRAGMATIC HERNIA REPAIR       A IV  Location/Drains/Wounds Patient Lines/Drains/Airways Status     Active Line/Drains/Airways     Name Placement date Placement time Site Days   Peripheral IV 09/28/23 22 G Right Hand 09/28/23  2151  Hand  less than 1            Intake/Output Last 24 hours No intake or output data in the 24 hours ending 09/28/23 2314  Labs/Imaging Results for orders placed or performed during the hospital encounter of 09/28/23 (from the past 48 hour(s))  Basic metabolic panel     Status: Abnormal   Collection Time: 09/28/23 12:51 PM  Result Value Ref Range   Sodium 140 135 - 145 mmol/L   Potassium 3.1 (L) 3.5 - 5.1 mmol/L   Chloride 107 98 - 111 mmol/L   CO2 26 22 - 32 mmol/L   Glucose, Bld 60 (L) 70 - 99 mg/dL    Comment: Glucose reference range applies only to samples taken after fasting for at least 8 hours.   BUN 11 6 - 20 mg/dL   Creatinine, Ser 4.74 0.44 - 1.00 mg/dL   Calcium 8.5 (L) 8.9 - 10.3 mg/dL   GFR, Estimated >25 >95 mL/min    Comment: (NOTE) Calculated using the CKD-EPI Creatinine Equation (2021)    Anion gap 7 5 - 15    Comment: Performed at [facility], [address], [place], [place] [address]  CBC     Status: Abnormal   Collection Time: 09/28/23 12:51 PM  Result Value Ref Range   WBC 11.8 (H) 4.0 - 10.5 K/uL   RBC 4.40 3.87 - 5.11 MIL/uL  Hemoglobin 13.9 12.0 - 15.0 g/dL   HCT 82.9 56.2 - 13.0 %   MCV 93.0 80.0 - 100.0 fL   MCH 31.6 26.0 - 34.0 pg   MCHC 34.0 30.0 - 36.0 g/dL   RDW 86.5 78.4 - 69.6 %   Platelets 250 150 - 400 K/uL   nRBC 0.0 0.0 - 0.2 %    Comment: Performed at [facility], [address], [place], [place] [address]   CT ABDOMEN PELVIS W CONTRAST  Result Date: 09/27/2023 CLINICAL DATA:  57 year old female presenting with acute onset of abdominal pain. Pruritic rash. EXAM: CT ABDOMEN AND PELVIS WITH CONTRAST TECHNIQUE: Multidetector CT imaging of the abdomen and pelvis was performed using the standard protocol following bolus  administration of intravenous contrast. RADIATION DOSE REDUCTION: This exam was performed according to the departmental dose-optimization program which includes automated exposure control, adjustment of the mA and/or kV according to patient size and/or use of iterative reconstruction technique. CONTRAST:  OMNIPAQUE IOHEXOL 300 MG/ML  SOLN COMPARISON:  CT of the abdomen and pelvis 06/20/2022. FINDINGS: Lower chest: Unremarkable. Hepatobiliary: No suspicious cystic or solid hepatic lesions. No intra or extrahepatic biliary ductal dilatation. Gallbladder is unremarkable in appearance. Pancreas: No pancreatic mass. No pancreatic ductal dilatation. No pancreatic or peripancreatic fluid collections or inflammatory changes. Spleen: Unremarkable. Adrenals/Urinary Tract: Bilateral kidneys and adrenal glands are normal in appearance. No hydroureteronephrosis. Urinary bladder wall appears diffusely thickened, similar to the prior study. Down Stomach/Bowel: The appearance of the stomach is normal. There is no pathologic dilatation of small bowel or colon. The appendix is not confidently identified and may be surgically absent. Regardless, there are no inflammatory changes noted adjacent to the cecum to suggest the presence of an acute appendicitis at this time. Vascular/Lymphatic: No significant atherosclerotic disease, aneurysm or dissection noted in the abdominal or pelvic vasculature. No lymphadenopathy noted in the abdomen or pelvis. Reproductive: Uterus and ovaries are unremarkable in appearance. Other: Trace volume of free fluid in the low anatomic pelvis, greater than expected for a postmenopausal female. No pneumoperitoneum. Musculoskeletal: There are no aggressive appearing lytic or blastic lesions noted in the visualized portions of the skeleton. IMPRESSION: 1. Diffuse bladder wall thickening again noted, concerning for probable cystitis. 2. Small volume of ascites in the low anatomic pelvis, potentially  reactive. This is abnormal in a postmenopausal female. 3. Additional incidental findings, as above. Electronically Signed   By: Trudie Reed M.D.   On: 09/27/2023 05:37    Pending Labs Unresulted Labs (From admission, onward)     Start     Ordered   09/29/23 0500  CBC  Tomorrow morning,   R        09/28/23 2257   09/29/23 0500  Basic metabolic panel  Tomorrow morning,   R        09/28/23 2257   09/28/23 2257  HIV Antibody (routine testing w rflx)  (HIV Antibody (Routine testing w reflex) panel)  Once,   R        09/28/23 2257            Vitals/Pain Today's Vitals   09/28/23 2300 09/28/23 2304 09/28/23 2305 09/28/23 2312  BP:      Pulse: 90 89 91 88  Resp: 20 20 17 19   Temp:      TempSrc:      SpO2: 100% 100% 100% 100%  Weight:      Height:      PainSc:  Isolation Precautions No active isolations  Medications Medications  methylPREDNISolone sodium succinate (SOLU-MEDROL) 125 mg/2 mL injection 60 mg (60 mg Intravenous Given 09/28/23 2236)  potassium chloride 10 mEq in 100 mL IVPB (has no administration in time range)  famotidine (PEPCID) IVPB 20 mg premix (has no administration in time range)  diphenhydrAMINE (BENADRYL) injection 25 mg (has no administration in time range)  diphenhydrAMINE (BENADRYL) capsule 25 mg (25 mg Oral Given 09/28/23 1348)  famotidine (PEPCID) tablet 20 mg (20 mg Oral Given 09/28/23 1348)  EPINEPHrine (EPI-PEN) injection 0.3 mg (0.3 mg Intramuscular Given 09/28/23 1516)  diphenhydrAMINE (BENADRYL) injection 25 mg (25 mg Intravenous Given 09/28/23 2152)  EPINEPHrine (EPI-PEN) injection 0.3 mg (0.3 mg Intramuscular Given 09/28/23 2235)    Mobility walks with person assist     Focused Assessments    R Recommendations: See Admitting Provider Note  Report given to:   Additional Notes: Pt has generalized rash. C/O lip and tongue swelling. No respiratory distress noted. On room air. Given IM Epi and Solumedrol at 2235. Had an  episode of becoming unresponsive while speaking with hospitalist. No concerns for anaphylaxis at this time.

## 2023-09-28 NOTE — ED Triage Notes (Signed)
Pt c/o allergic reactionx6d. Pt denies new meds or new products. Pt c/o tongue swelling and throat closing. Pt c/o itching. Pt able to maintain secretions. Pt is eupneic. Pt has hives on arms bilat, legs bilat.

## 2023-09-28 NOTE — ED Notes (Signed)
Report received from [facility] paramedic. Assumed care of pt at this time.

## 2023-09-28 NOTE — Assessment & Plan Note (Addendum)
-  Non-blanchable purpuric rash. Unclear etiology. She had complains of left sided jaw pain earlier this month but has since resolved. Recent CPR only mildly elevated at 1. ESR and ANA negative. Platelet, LFT and renal function normal. Low suspicion for giant cell arteritis. -pt has dermatology appointment scheduled for tomorrow. She ultimately will need biopsy of the rash for more definitive diagnosis.

## 2023-09-28 NOTE — Discharge Instructions (Addendum)
You were seen for a rash and concern for allergic reaction We gave you a dose of a medication called epinephrine We observed you for several hours and he did not need another dose We have called in a prescription for you to pick up epinephrine to take at home If you have tongue swelling again you should give yourself this medication via the injection and come to the emergency department at once Continue taking other prescribed meds for your rash and follow-up with the dermatologist as previously directed  [facility] assistance programs. If you are behind on your bills and expenses, and need some help to make it through a short term hardship or financial emergency, there are several organizations and charities in the [place] area that may be able to help. They range from the [employer], [employer], Landscape architect of [employer] and the local community action agency, the Intel, [employer]. These groups may be able to provide you resources to help pay your utility bills, rent, and they even offer housing assistance.  Crisis assistance program Find help for paying your rent, electric bills, free food, and even funds to pay your mortgage. The [employer] 808-568-6213) offers several services to local families, as funding allows. The Emergency Assistance Program (EAP), which they administer, provides household goods, free food, clothing, and financial aid to people in need in the [facility] area. The EAP program does have some qualification, and counselors will interview clients for financial assistance by written referral only. Referrals need to be made by the Department of Social Services or by other EAP approved human services agencies or charities in the area.  Money for resources for emergency assistance are available for security deposits for rent, water, electric, and gas, past due rent, utility bills, past due  mortgage payments, food, and clothing. The [employer] also operates a Programme researcher, broadcasting/film/video on the site. More Liberty Global.  [facility] [employer] of [employer], which can be reached at ([phone], offers emergency assistance programs for those in need of help, such as food, rent assistance, a soup kitchen, shelter, and clothing. They are based in [facility] but provide a number of services to those that qualify for assistance. Continue with Open [employer]ries programs.  [facility] Department of Social Services may be able to offer temporary financial assistance and cash grants for paying rent and utilities. Help may be provided for local county residents who may be experiencing personal crisis when other resources, including government programs, are not available. Call [phone]  [place]. [place] [employer], which is based in [place], provides financial assistance of up to $50.00 to help pay for rent, utilities, cooling bills, rent, and prescription medications. The program also provides secondhand furniture to those in need. [phone]  [employer] is a Geneticist, molecular. The organization can offer emergency assistance for paying rent, electric bills, utilities, food, household products and furniture. They offer extensive emergency and transitional housing for families, children and single women, and also run a Boy's and Dole Food. [address] [employer], [employer], and other aid offered too. [address] [place], [place] [address], [phone]  Additional locations of the [employer] are in [place] and other nearby communities. When you have an emergency, need free food, money for basic needs, or just need assistance around Christmas, then the [employer] may have the resources  you need. Or they can refer you to nearby agencies. Learn more.  [place] Low Income Risk manager -  This is offered for [facility] families. The federal government created CIT [employer] Program provides a one-time cash grant payment to help eligible low-income families pay their electric and heating bills. [address] [place], [place] [address], [phone]  Government and [employer] - The county administers several emergency and self-sufficiency programs. Residents of [place] can get help with energy bills and food, rent, and other expenses. In addition, work with a Sports coach who may be able to help you find a job or improve your employment skills. More [place] public assistance.  [place] Emergency Assistance - A program offers emergency utility and rent funds for greater [employer] area residents. The program can also provide counseling and referrals to charities and government programs. Also provides food and a free meal program that serves lunch Mondays - Saturdays and dinner seven days per week to individuals in the community. [address] [place], [place] [address], [phone]  [employer] - Offers affordable apartment and housing communities across [place] and [place]. The low income and seniors can access public housing, rental assistance to qualified applicants, and apply for the section 8 rent subsidy program. Other programs include Chiropractor and Engineer, maintenance. [address], [place], [place] [address], dial [phone].  Basic needs such as clothing - Low income families can receive free items (school supplies, clothes, holiday assistance, etc.) from clothing closets while more moderate income [address] families can shop at [employer]. Locations across the area help the needy. Get information on [place] free clothing centers.  The [facility] provides transitional housing to veterans and the disabled. Clients will  also access other services too, including life skills classes, case management, and assistance in finding permanent housing. [address] [place], [place] [address], call [phone]  [facility] Transitional Housing in [place] is for people who were just evicted or that are formerly homeless. The non-profit will also help then gain self-sufficiency, find a home or apartment to live in, and also provides information on rent assistance when needed. Dial [phone]  [employer] Partnership to End Homelessness is available in [place]. Families that were evicted or that are homeless can gain shelter, food, clothing, furniture, and also emergency financial assistance. Other services include financial skills and life skills coaching, job training, and case management. [address] [place], [place] [address]. Telephone [phone].  The [employer]ne[employer], [employer]. runs the [employer]. This can help people save money on their heating and summer cooling bills, and is free to low income families. Free upgrades can be made to your home. Phone [phone]  Many of the non-profits and programs mentioned above are all inclusive, meaning they can meet many needs of the low income, such as energy bills, food, rent, and more. However there are several organizations that focus just on rent and housing. Read more on rent assistance in [place] region.  Legal assistance for evictions, foreclosure, and more If you need free legal advice on civili issues, such as foreclosures, evictions, Electronics engineer, government programs, domestic issues and more, Armed forces operational officer Aid of [facility]) is a Associate Professor firm that provides free legal services and counsel to lower income people, seniors, disabled, and others. The goal is to ensure everyone has access to justice and fair  representation.  Call them at [phone], or click here to learn more about  [place] free legal assistance programs.  [place] [employer] and funds for emergency expenses The [employer] is another organization that can provide people with De[employer] & Company and funds to pay bills. Their assistance depends on funding, and the demand for help is always very high. They can provide cash to help pay rent, a missed mortgage payment, or gas, electric, and water bills. But the assistance doesn't stop there. They also have a food pantry on site, which can provide food once every three (3) months to people who need help. The [employer] can also offer a Engineering geologist once every three (3) months for a maximum three (3) times. After receiving this voucher over that period of time, applicants can receive this aid one every six (6) months after that. [phone].  Kohl's action agency The Intel, [employer]. offers job and Dispensing optician. Resources are focused on helping students obtain the skills and experiences that are necessary to compete in today's challenging and tight job market. The non-profit faith-based community action agency offers internship trainings as well as classroom instruction. Economically disadvantaged and challenged individuals and potential employers can use their services. Classes are tailored to meet the needs of people in the [facility] region. [place], [place] [address], [phone]    Foreclosure prevention services Housing Counseling and Education is also offered by [employer] of the [place]. The agency (phone number is below) is a Engineer, structural providing foreclosure advice and counseling. They offer mortgage resolution counseling and also reverse mortgage counseling. Counselors can direct people to both Kimberly[employer]ark, as well as [employer] Company foreclosure assistance options.  Warehouse manager has locations in  [place] and [employer]. They run debt and foreclosure prevention programs for local families. A sampling of the programs offered include both Budget and Housing Counseling. This includes money management, financial advice, budget review and development of a written action plan with a Pensions consultant to help solve specific individual financial problems. In addition, housing and mortgage counselors can also provide pre- and post-purchase homeownership counseling, default resolution counseling (to prevent foreclosure) and reverse mortgage counseling. A Debt Management Program allows people and families with a high level of credit card or medical debt to consolidate and repay consumer debt and loans to creditors and rebuild positive credit ratings and scores. [phone]  Debt assistance programs Receive free counseling and debt help from [facility] of the [place]. The [facility] based agency can be reached at [phone]. The counselors provide free help, and the services include budget counseling. This will help people manage their expenses and set goals. They also offer a Forensic scientist, which will help individuals consolidate their debts and become debt free. Most of the workshops and services are free.  Community clinics in [place] Five of the leading health and dental centers are listed below. They may be able to provide medication, physicals, dental care, and general family care to residents of all incomes and backgrounds across the region. Some of the programs focus on the low income and underinsured. However if these clinics can't meet your needs, find information and details on more clinics in [facility].  Some of the options include [employer] of [employer]. This center provides free or low cost health care to low-income adults 18 - 64, who  have no health insurance. Among other services offered include a  pharmacy and eye clinic. Phone [phone]  [facility], which is located in [place], is a community clinic that provides primary medical and health care to uninsured and underinsured adults and families, as well as the low income, in the greater [place] area on a sliding-fee scale. Call [phone]  [place] Adult Dental Program - They run a dental assistance program that is organized by [facility] to provide dental services and aid to [employer]. Services offered by the dental clinic are limited to extractions, pain management, and minor restorative care. [phone]  [place] [facility] has locations in [facility] and [place]. The community clinics provide complete pediatric care including primary health, mental health, social work, neurology, cardiology, asthma. Dial [phone].  In addition to those [address] and [employer], find other free community clinics in [place] and across the county.  Food pantry and assistance Some of the local food pantries and distribution centers to call for free food and groceries include The Hive of [place] (phone [phone]), The [facility] (phone [phone]) and also [employer]PL Corporation. Dial [phone].  Several other food banks in the region provide clothing, free food and meals, access to soup kitchens and other help. Find the addresses and phone numbers of more food pantries in [place]. http://www.needhelppayingbills.com/html/[place]_assistance_pro.html     Northrop Grumman The United Way's "B3979455" is a great source of information about community services available.  Access by dialing 2-1-1 from anywhere in [place], or by website -  PooledIncome.pl.  [facility]) now has a full time staff to take referrals for all individuals needing shelter or housing placement.  They do not do direct services or have beds, but are in charge of assessing and coordinating placement for individuals needing shelter. The phone number for coordinated entry is [phone].    Other Armed forces technical officer Number and Address  [facility] Housing for homeless and needy men with substance abuse issues 5 day Covid Quarantine [phone] [address] [place], [place]  Goldman [employer] of [place] Emergency assistance for [employer] Mills only [employer] [phone] Ext. [address] [place], [place]  Clara [employer] of the [place] Domestic violence shelter for women and their children [phone] [place], [place]  [facility] Domestic violence shelter for women and their children Each family gets their own unit and can quarantine after admission. [phone] [place], [place]  Interactive [facility]) / Resources for the [employer] center for the homeless Information and referral to housing resources Counseling Showers Laundry Barbershop Phone bank Mailroom Computer lab Medical clinic Bike maintenance center 14 Day covid quarantine [phone] [address] [place], [place]  [facility] - [employer] Men's Shelter Emergency housing Food Emergency financial assistance Permanent supportive housing [phone] [address] [place], [place]  The [employer] Stores Crisis assistance Medication Housing Food Utility assistance [phone] [address] [place], [place]   [phone] [address], [place] [employer], [place]  The Mons[employer] Company of [place]       Transitional housing Case Chartered certified accountant assistance [phone] [address] [place], [place]  [employer], [employer] for adult men and women Can admit with MD clearance from the  hospital after positive covid test.  Intake Hotline [phone] [address] [place], [place]  24-hour Crisis Line for those Facing Homelessness   Information and referral to community resources [phone]  [employer] and additional resources. Can admit with MD clearance after positive covid test.  Prefer online applications (blocked on [facility] computers)/ currently full. Will be able to do intake at the office starting in May.  [phone] Admin only location

## 2023-09-29 ENCOUNTER — Ambulatory Visit: Payer: Medicaid Other | Admitting: Dermatology

## 2023-09-29 LAB — BASIC METABOLIC PANEL
Anion gap: 9 (ref 5–15)
BUN: 9 mg/dL (ref 6–20)
CO2: 24 mmol/L (ref 22–32)
Calcium: 8 mg/dL — ABNORMAL LOW (ref 8.9–10.3)
Chloride: 107 mmol/L (ref 98–111)
Creatinine, Ser: 0.76 mg/dL (ref 0.44–1.00)
GFR, Estimated: >60 mL/min (ref >60)
Glucose, Bld: 209 mg/dL — ABNORMAL HIGH (ref 70–99)
Potassium: 3.5 mmol/L (ref 3.5–5.1)
Sodium: 140 mmol/L (ref 135–145)

## 2023-09-29 LAB — CBC
HCT: 38.3 % (ref 36.0–46.0)
Hemoglobin: 12.9 g/dL (ref 12.0–15.0)
MCH: 31.2 pg (ref 26.0–34.0)
MCHC: 33.7 g/dL (ref 30.0–36.0)
MCV: 92.7 fL (ref 80.0–100.0)
Platelets: 235 10*3/uL (ref 150–400)
RBC: 4.13 MIL/uL (ref 3.87–5.11)
RDW: 13.2 % (ref 11.5–15.5)
WBC: 6.6 10*3/uL (ref 4.0–10.5)
nRBC: 0 % (ref 0.0–0.2)

## 2023-09-29 LAB — HIV ANTIBODY (ROUTINE TESTING W REFLEX): HIV Screen 4th Generation wRfx: NONREACTIVE

## 2023-09-29 LAB — GLUCOSE, CAPILLARY: Glucose-Capillary: 142 mg/dL — ABNORMAL HIGH (ref 70–99)

## 2023-09-29 NOTE — Progress Notes (Signed)
   09/29/23 2109  Spiritual Encounters  Type of Visit Initial  Care provided to: Family;Patient  Conversation partners present during encounter Nurse;Physician  Reason for visit Code  OnCall Visit Yes  Spiritual Framework  Community/Connection Family  Family Stress Factors Major life changes   The patient's family is upset that the doctors can't figure out what is going on with the patient.  The patient was revived from code blue. The family is upset that the doctors don't know why this code happened.  I listened to the daughter who told talked about her mother's near death and the doctor's inability to diagnosis her.  Anginette Espejo Lile-King

## 2023-09-29 NOTE — ED Notes (Signed)
ED TO INPATIENT HANDOFF REPORT  ED Nurse Name and Phone #: Percival Spanish [phone]  S Name/Age/Gender Amy Flowers 57 y.o. female Room/Bed: 004C/004C  Code Status   Code Status: Full Code  Home/SNF/Other Home Patient oriented to: self, place, time, and situation Is this baseline? Yes   Triage Complete: Triage complete  Chief Complaint Anaphylaxis [T78.2XXA]  Triage Note Pt c/o allergic reactionx6d. Pt denies new meds or new products. Pt c/o tongue swelling and throat closing. Pt c/o itching. Pt able to maintain secretions. Pt is eupneic. Pt has hives on arms bilat, legs bilat.   Allergies No Known Allergies  Level of Care/Admitting Diagnosis ED Disposition     ED Disposition  Admit   Condition  --   Comment  [facility] [[id number]]  Level of Care: Telemetry Medical [104]  May admit patient to [facility] or [facility] if equivalent level of care is available:: No  Covid Evaluation: Confirmed COVID Negative  Diagnosis: Anaphylaxis [190363]  Admitting Physician: Burnadette Pop [[id number]]  Attending Physician: Burnadette Pop [[id number]]  Certification:: I certify this patient will need inpatient services for at least 2 midnights          B Medical/Surgery History Past Medical History:  Diagnosis Date   Aortic atherosclerosis (HCC) 06/26/2017   GERD (gastroesophageal reflux disease) 02/14/2019   Hiatal hernia 02/14/2019   Vitamin D deficiency 09/28/2017   Past Surgical History:  Procedure Laterality Date   APPENDECTOMY     DIAPHRAGMATIC HERNIA REPAIR       A IV Location/Drains/Wounds Patient Lines/Drains/Airways Status     Active Line/Drains/Airways     Name Placement date Placement time Site Days   Peripheral IV 09/28/23 22 G Right Hand 09/28/23  2151  Hand  1            Intake/Output Last 24 hours  Intake/Output Summary (Last 24 hours) at 09/29/2023 1148 Last data filed at 09/29/2023 0244 Gross per 24 hour  Intake 250 ml   Output --  Net 250 ml    Labs/Imaging Results for orders placed or performed during the hospital encounter of 09/28/23 (from the past 48 hour(s))  Basic metabolic panel     Status: Abnormal   Collection Time: 09/28/23 12:51 PM  Result Value Ref Range   Sodium 140 135 - 145 mmol/L   Potassium 3.1 (L) 3.5 - 5.1 mmol/L   Chloride 107 98 - 111 mmol/L   CO2 26 22 - 32 mmol/L   Glucose, Bld 60 (L) 70 - 99 mg/dL    Comment: Glucose reference range applies only to samples taken after fasting for at least 8 hours.   BUN 11 6 - 20 mg/dL   Creatinine, Ser 3.08 0.44 - 1.00 mg/dL   Calcium 8.5 (L) 8.9 - 10.3 mg/dL   GFR, Estimated >65 >78 mL/min    Comment: (NOTE) Calculated using the CKD-EPI Creatinine Equation (2021)    Anion gap 7 5 - 15    Comment: Performed at [facility], [address], [place], [place] [address]  CBC     Status: Abnormal   Collection Time: 09/28/23 12:51 PM  Result Value Ref Range   WBC 11.8 (H) 4.0 - 10.5 K/uL   RBC 4.40 3.87 - 5.11 MIL/uL   Hemoglobin 13.9 12.0 - 15.0 g/dL   HCT 95.2 84.1 - 32.4 %   MCV 93.0 80.0 - 100.0 fL   MCH 31.6 26.0 - 34.0 pg   MCHC 34.0 30.0 -  36.0 g/dL   RDW 16.1 09.6 - 04.5 %   Platelets 250 150 - 400 K/uL   nRBC 0.0 0.0 - 0.2 %    Comment: Performed at [facility], [address] [place], [place] [address]  HIV Antibody (routine testing w rflx)     Status: None   Collection Time: 09/29/23 12:45 AM  Result Value Ref Range   HIV Screen 4th Generation wRfx Non Reactive Non Reactive    Comment: Performed at [facility], [address] [place], [place] [address]  CBC     Status: None   Collection Time: 09/29/23 12:45 AM  Result Value Ref Range   WBC 6.6 4.0 - 10.5 K/uL   RBC 4.13 3.87 - 5.11 MIL/uL   Hemoglobin 12.9 12.0 - 15.0 g/dL   HCT 82.9 56.2 - 13.0 %   MCV 92.7 80.0 - 100.0 fL   MCH 31.2 26.0 - 34.0 pg   MCHC 33.7 30.0 - 36.0 g/dL   RDW 86.5 78.4 - 69.6 %   Platelets 235 150 - 400 K/uL   nRBC  0.0 0.0 - 0.2 %    Comment: Performed at [facility], [address], [place], [place] [address]  Basic metabolic panel     Status: Abnormal   Collection Time: 09/29/23 12:45 AM  Result Value Ref Range   Sodium 140 135 - 145 mmol/L   Potassium 3.5 3.5 - 5.1 mmol/L   Chloride 107 98 - 111 mmol/L   CO2 24 22 - 32 mmol/L   Glucose, Bld 209 (H) 70 - 99 mg/dL    Comment: Glucose reference range applies only to samples taken after fasting for at least 8 hours.   BUN 9 6 - 20 mg/dL   Creatinine, Ser 4.13 0.44 - 1.00 mg/dL   Calcium 8.0 (L) 8.9 - 10.3 mg/dL   GFR, Estimated >24 >40 mL/min    Comment: (NOTE) Calculated using the CKD-EPI Creatinine Equation (2021)    Anion gap 9 5 - 15    Comment: Performed at [facility], [address], [place], [place] [address]   No results found.  Pending Labs Unresulted Labs (From admission, onward)    None       Vitals/Pain Today's Vitals   09/29/23 0915 09/29/23 1000 09/29/23 1045 09/29/23 1050  BP: 115/75 125/77 116/73   Pulse: 76 71 74   Resp: 18 14 16    Temp:    98.3 F (36.8 C)  TempSrc:    Oral  SpO2: 98% 98% 98%   Weight:      Height:      PainSc:        Isolation Precautions No active isolations  Medications Medications  methylPREDNISolone sodium succinate (SOLU-MEDROL) 125 mg/2 mL injection 60 mg (60 mg Intravenous Given 09/29/23 1051)  famotidine (PEPCID) IVPB 20 mg premix (0 mg Intravenous Stopped 09/29/23 0045)  diphenhydrAMINE (BENADRYL) injection 25 mg (has no administration in time range)  diphenhydrAMINE (BENADRYL) capsule 25 mg (25 mg Oral Given 09/28/23 1348)  famotidine (PEPCID) tablet 20 mg (20 mg Oral Given 09/28/23 1348)  EPINEPHrine (EPI-PEN) injection 0.3 mg (0.3 mg Intramuscular Given 09/28/23 1516)  diphenhydrAMINE (BENADRYL) injection 25 mg (25 mg Intravenous Given 09/28/23 2152)  EPINEPHrine (EPI-PEN) injection 0.3 mg (0.3 mg Intramuscular Given 09/28/23 2235)  potassium chloride 10 mEq  in 100 mL IVPB (0 mEq Intravenous Stopped 09/29/23 0244)    Mobility walks     Focused  Assessments     R Recommendations: See Admitting Provider Note  Report given to:   Additional Notes:

## 2023-09-29 NOTE — Inpatient Diabetes Management (Signed)
Inpatient Diabetes Program Recommendations  AACE/ADA: New Consensus Statement on Inpatient Glycemic Control (2015)  Target Ranges:  Prepandial:   less than 140 mg/dL      Peak postprandial:   less than 180 mg/dL (1-2 hours)      Critically ill patients:  140 - 180 mg/dL   Lab Results  Component Value Date   GLUCAP 80 06/24/2017   HGBA1C 6.0 (H) 02/26/2022    Review of Glycemic Control  Latest Reference Range & Units 09/29/23 00:45  Glucose 70 - 99 mg/dL 657 (H)   Diabetes history: no DM  Current orders for Inpatient glycemic control: None  Inpatient Diabetes Program Recommendations:    Note: glucose 209 last night.   -   Consider CBG checks and possibly Novolog sliding scale tid + hs if elevated.   Thanks,  Christena Deem RN, MSN, BC-ADM Inpatient Diabetes Coordinator Team Pager [phone] (8a-5p)

## 2023-09-29 NOTE — Progress Notes (Signed)
PROGRESS NOTE  Amy Flowers  VQQ:[id number] DOB: [birth year] DOA: 09/28/2023 PCP: Hoy Register, MD   Brief Narrative: Patient is a 57 year old female with history of esophageal dysmotility, GERD Arabic speaking female  who presented with tongue swelling.  She actually presented to ED on 09/10/2023 with complaint of left-sided jaw pain, found to have bilateral middle ear effusion without any signs of infection and was discharged on Zyrtec, Flonase.  Presented back to ED the following day with jaw pain, CT maxillofacial did not show any acute fracture but showed degenerative arthritis of both TMJs.  She presented again to the urgent care on 10/16 with complaint of hives after eating mixed nuts, she was treated with IM steroid and discharged on steroid taper.  She presented again to urgent care with no improvement.  At this time she was referred to the ED.She complained of no tongue swelling, difficulty swallowing, shortness of breath. Started on IV steroids, antihistamines.  Remains hemodynamically stable.  Lab work showed mild leukocytosis.  EpiPen was given.  PCCM was also contacted but she became more responsive so formal consult not done. She remains hemodynamically stable, comfortable this morning.  Plan for discharge home tomorrow if remains stable.  Assessment & Plan:  Principal Problem:   Anaphylaxis Active Problems:   Vasculitis (HCC)   Angioedema   Hypokalemia  Anaphylaxis: Presented earlier with hives to the urgent care, was discharged on the steroid with did not help.  Presented with progression of symptoms with tongue swelling, shortness of breath, difficulty swallowing.  Also became unresponsive in the ED.  Given EpiPen, IV Solu-Medrol. Continue supportive care, continue IV Solu-Medrol, H1 and H2 blocker. Currently respiratory status stable.  On room air.  Vitals good. Not sure what is allergic to.  Purpuric rash:vasculitis?:  Has nonblanchable purpuric rash which is generalized.   This is is not associate with her current allergic reaction.  Unclear etiology.  ESR, ANA negative.  Other labs stable. She had an appointment dermatology today but had to be canceled.  I have recommended her daughter to get another appointment.  Hypokalemia: Supplemented         DVT prophylaxis:SCDs Start: 09/28/23 2258     Code Status: Full Code  Family Communication: Discussed with daughter at bedside  Patient status: Inpatient  Patient is from : Home  Anticipated discharge to: Home  Estimated DC date: Tomorrow   Consultants: None  Procedures: None  Antimicrobials:  Anti-infectives (From admission, onward)    None       Subjective: Patient seen and examined at bedside today.  She was on room air.  Blood pressure stable.  She looks very comfortable.  Denies shortness of breath or wheezing.  She still has some mild swelling of tongue but is not making her uncomfortable.  She is able to swallow the food.  Daughter requested to be monitored for 1 more day because she has been sent home several times and had to come back.  Objective: Vitals:   09/29/23 0400 09/29/23 0445 09/29/23 0530 09/29/23 0648  BP: 122/74 130/79 120/82   Pulse: 77 81 75   Resp: 18 20 17    Temp:    98.2 F (36.8 C)  TempSrc:    Oral  SpO2: 98% 99% 98%   Weight:      Height:        Intake/Output Summary (Last 24 hours) at 09/29/2023 0812 Last data filed at 09/29/2023 0244 Gross per 24 hour  Intake 250 ml  Output --  Net 250 ml   Filed Weights   09/28/23 1246  Weight: 70 kg    Examination:  General exam: Overall comfortable, not in distress HEENT: PERRL,? mild tongue swelling Respiratory system:  no wheezes or crackles  Cardiovascular system: S1 & S2 heard, RRR.  Gastrointestinal system: Abdomen is nondistended, soft and nontender. Central nervous system: Alert and oriented Extremities: No edema, no clubbing ,no cyanosis Skin: No rashes, no ulcers,no icterus     Data  Reviewed: I have personally reviewed following labs and imaging studies  CBC: Recent Labs  Lab 09/27/23 0206 09/28/23 1251 09/29/23 0045  WBC 8.7 11.8* 6.6  NEUTROABS 5.3  --   --   HGB 13.8 13.9 12.9  HCT 40.2 40.9 38.3  MCV 91.2 93.0 92.7  PLT 197 250 235   Basic Metabolic Panel: Recent Labs  Lab 09/27/23 0206 09/28/23 1251 09/29/23 0045  NA 137 140 140  K 4.0 3.1* 3.5  CL 105 107 107  CO2 25 26 24   GLUCOSE 109* 60* 209*  BUN 11 11 9   CREATININE 0.66 0.72 0.76  CALCIUM 8.8* 8.5* 8.0*     No results found for this or any previous visit (from the past 240 hour(s)).   Radiology Studies: No results found.  Scheduled Meds:  methylPREDNISolone (SOLU-MEDROL) injection  60 mg Intravenous Q12H   Continuous Infusions:  famotidine (PEPCID) IV Stopped (09/29/23 0045)     LOS: 1 day   Burnadette Pop, MD [facility] P10/22/2024, 8:12 AM

## 2023-09-30 ENCOUNTER — Inpatient Hospital Stay (HOSPITAL_COMMUNITY): Payer: MEDICAID

## 2023-09-30 DIAGNOSIS — R55 Syncope and collapse: Secondary | ICD-10-CM

## 2023-09-30 DIAGNOSIS — R569 Unspecified convulsions: Secondary | ICD-10-CM

## 2023-09-30 LAB — GLUCOSE, CAPILLARY
Glucose-Capillary: 204 mg/dL — ABNORMAL HIGH (ref 70–99)
Glucose-Capillary: 145 mg/dL — ABNORMAL HIGH (ref 70–99)

## 2023-09-30 MED ORDER — DICYCLOMINE HCL 20 MG PO TABS
20.0000 mg | ORAL_TABLET | Freq: Three times a day (TID) | ORAL | Status: DC | PRN
  Administered 2023-10-02: 20 mg via ORAL
  Filled 2023-09-30 (×2): qty 1

## 2023-09-30 MED ORDER — FAMOTIDINE 20 MG PO TABS
20.0000 mg | ORAL_TABLET | Freq: Every day | ORAL | Status: DC
  Administered 2023-09-30 – 2023-10-03 (×4): 20 mg via ORAL
  Filled 2023-09-30 (×4): qty 1

## 2023-09-30 MED ORDER — ALPRAZOLAM 0.25 MG PO TABS: 0.2500 mg | ORAL_TABLET | Freq: Three times a day (TID) | ORAL | Status: DC | PRN

## 2023-09-30 MED ORDER — PANTOPRAZOLE SODIUM 40 MG PO TBEC
40.0000 mg | DELAYED_RELEASE_TABLET | Freq: Every day | ORAL | Status: DC
Start: 2023-09-30 — End: 2023-10-03
  Administered 2023-09-30 – 2023-10-03 (×4): 40 mg via ORAL
  Filled 2023-09-30 (×4): qty 1

## 2023-09-30 MED ORDER — HYDROXYZINE HCL 25 MG PO TABS
25.0000 mg | ORAL_TABLET | Freq: Three times a day (TID) | ORAL | Status: DC | PRN
  Administered 2023-09-30 – 2023-10-02 (×2): 25 mg via ORAL
  Filled 2023-09-30 (×2): qty 1

## 2023-09-30 MED ORDER — ALUM & MAG HYDROXIDE-SIMETH 200-200-20 MG/5ML PO SUSP
30.0000 mL | Freq: Four times a day (QID) | ORAL | Status: DC | PRN
  Administered 2023-09-30: 30 mL via ORAL
  Filled 2023-09-30: qty 30

## 2023-09-30 MED ORDER — PREDNISONE 20 MG PO TABS
40.0000 mg | ORAL_TABLET | Freq: Every day | ORAL | Status: DC
  Administered 2023-09-30 – 2023-10-01 (×2): 40 mg via ORAL
  Filled 2023-09-30 (×2): qty 2

## 2023-09-30 NOTE — Procedures (Signed)
Patient Name: Amy Flowers  MRN: [id number]  Epilepsy Attending: Charlsie Quest  Referring Physician/Provider: Burnadette Pop, MD  Date: 09/28/2023 Duration: 25.35 mins  Patient history: 57yo F with recurrrent syncope getting eeg to evaluate for seizure  Level of alertness: Awake  AEDs during EEG study: None  Technical aspects: This EEG study was done with scalp electrodes positioned according to the 10-20 International system of electrode placement. Electrical activity was reviewed with band pass filter of 1-70Hz , sensitivity of 7 uV/mm, display speed of 37mm/sec with a 60Hz  notched filter applied as appropriate. EEG data were recorded continuously and digitally stored.  Video monitoring was available and reviewed as appropriate.  Description: The posterior dominant rhythm consists of 8 Hz activity of moderate voltage (25-35 uV) seen predominantly in posterior head regions, symmetric and reactive to eye opening and eye closing. Physiologic photic driving was seen during photic stimulation.  Hyperventilation was not performed.     IMPRESSION: This study is within normal limits. No seizures or epileptiform discharges were seen throughout the recording.  A normal interictal EEG does not exclude the diagnosis of epilepsy.  Khadir Roam Annabelle Harman

## 2023-09-30 NOTE — Progress Notes (Signed)
   09/29/23 1955  Provider Notification  Provider Name/Title Modoub Jawo,NP  Date Provider Notified 09/29/23  Time Provider Notified 1955  Method of Notification Page (secure chat)  Notification Reason Change in status (during shift change,patient had unresponsive episode,patient had pulse,was difficult to arouse,patient is awake now was able to move all extremities,complain of headache,rapid response notified,,any orders recommendation,)  Provider response See new orders  Date of Provider Response 09/29/23  Time of Provider Response 2001

## 2023-09-30 NOTE — Plan of Care (Signed)

## 2023-09-30 NOTE — Progress Notes (Signed)
EEG complete - results pending 

## 2023-09-30 NOTE — Plan of Care (Signed)
  Problem: Clinical Measurements: Goal: Ability to maintain clinical measurements within normal limits will improve Outcome: Not Progressing   Problem: Clinical Measurements: Goal: Will remain free from infection Outcome: Progressing   Problem: Clinical Measurements: Goal: Diagnostic test results will improve Outcome: Progressing   Problem: Nutrition: Goal: Adequate nutrition will be maintained Outcome: Progressing   Problem: Coping: Goal: Level of anxiety will decrease Outcome: Progressing   Problem: Pain Management: Goal: General experience of comfort will improve Outcome: Progressing   Problem: Safety: Goal: Ability to remain free from injury will improve Outcome: Progressing   Problem: Skin Integrity: Goal: Risk for impaired skin integrity will decrease Outcome: Progressing

## 2023-09-30 NOTE — Progress Notes (Signed)
PROGRESS NOTE  Amy Flowers  ZOX:[id number] DOB: 01/04/1966 DOA: 09/28/2023 PCP: Hoy Register, MD   Brief Narrative: Patient is a 57 year old female with history of esophageal dysmotility, GERD Arabic speaking female  who presented with tongue swelling.  She actually presented to ED on 09/10/2023 with complaint of left-sided jaw pain, found to have bilateral middle ear effusion without any signs of infection and was discharged on Zyrtec, Flonase.  Presented back to ED the following day with jaw pain, CT maxillofacial did not show any acute fracture but showed degenerative arthritis of both TMJs.  She presented again to the urgent care on 10/16 with complaint of hives after eating mixed nuts, she was treated with IM steroid and discharged on steroid taper.  She presented again to urgent care with no improvement.  At this time she was referred to the ED.She complained of no tongue swelling, difficulty swallowing, shortness of breath. Started on IV steroids, antihistamines.  Remains hemodynamically stable.  Lab work showed mild leukocytosis.  EpiPen was given.  PCCM was also contacted but she became more responsive so formal consult not done. Patient had 2 episodes of brief syncopal episode: Last night and this morning.  Syncopal workup pending.  Ordered MRI, EEG, echo.  Assessment & Plan:  Principal Problem:   Anaphylaxis Active Problems:   Vasculitis (HCC)   Angioedema   Hypokalemia  Anaphylaxis: Presented earlier with hives to the urgent care, was discharged on the steroid with did not help.  Presented with progression of symptoms with tongue swelling, shortness of breath, difficulty swallowing.  Also became unresponsive in the ED.  Given EpiPen, IV Solu-Medrol. Continue supportive care, H1 and H2 blocker.  Steroids changed to oral. Currently respiratory status stable.  On room air.  Vitals good. Not sure what is allergic to.  Recurrent syncopal episodes: Unclear etiology.  Patient remains  hemodynamically stable but suddenly  becomes unresponsive for short period of time.  Happened 3 times during this hospitalization.  Not postictal.  No prodromal syndromes.  Vitals always remained good.  Remains alert and oriented after the event. I am more suspicious for conversion disorder but will initiate syncopal workup.  PT eval, echocardiogram, MRI of the brain.  CT head did not show any acute acute findings.  Will also do EEG.  Orthostatic vitals negative.  If this continues ,will consult neurology  Purpuric rash:vasculitis?:  Has nonblanchable purpuric rash which is generalized.  This is is not associate with her current allergic reaction.  Unclear etiology.  ESR, ANA negative.  Other labs stable. She had an appointment dermatology today but had to be canceled.  I have recommended her daughter to get another appointment.  Hypokalemia: Supplemented         DVT prophylaxis:SCDs Start: 09/28/23 2258     Code Status: Full Code  Family Communication: Discussed with daughter at bedside  Patient status: Inpatient  Patient is from : Home  Anticipated discharge to: Home  Estimated DC date: 1-2 days   Consultants: None  Procedures: None  Antimicrobials:  Anti-infectives (From admission, onward)    None       Subjective: Patient seen and examined at the bedside today.  I was called early this morning because of rapid response.  As per the report, patient briefly lost her consciousness.  Also happened last night.  When I entered the room, her vitals were stable and she had already remained conscious, alert and oriented.  No focal deficits.  Speech clear.  Objective: Vitals:  09/30/23 0542 09/30/23 0739 09/30/23 0749 09/30/23 0855  BP: 117/71 139/80 (!) 140/79 117/63  Pulse: 72 83 86 72  Resp: 18 16 14 17   Temp: 98.2 F (36.8 C) 98.6 F (37 C) 98.6 F (37 C) 98.4 F (36.9 C)  TempSrc: Oral Oral Oral Oral  SpO2: 99% 100% 100% 99%  Weight: 62.2 kg     Height:         Intake/Output Summary (Last 24 hours) at 09/30/2023 1124 Last data filed at 09/30/2023 0548 Gross per 24 hour  Intake 820 ml  Output --  Net 820 ml   Filed Weights   09/28/23 1246 09/30/23 0542  Weight: 70 kg 62.2 kg    Examination:  General exam: Overall comfortable, not in distress HEENT: PERRL Respiratory system:  no wheezes or crackles  Cardiovascular system: S1 & S2 heard, RRR.  Gastrointestinal system: Abdomen is nondistended, soft and nontender. Central nervous system: Alert and oriented Extremities: No edema, no clubbing ,no cyanosis Skin: Scattered purpuric rash   Data Reviewed: I have personally reviewed following labs and imaging studies  CBC: Recent Labs  Lab 09/27/23 0206 09/28/23 1251 09/29/23 0045  WBC 8.7 11.8* 6.6  NEUTROABS 5.3  --   --   HGB 13.8 13.9 12.9  HCT 40.2 40.9 38.3  MCV 91.2 93.0 92.7  PLT 197 250 235   Basic Metabolic Panel: Recent Labs  Lab 09/27/23 0206 09/28/23 1251 09/29/23 0045  NA 137 140 140  K 4.0 3.1* 3.5  CL 105 107 107  CO2 25 26 24   GLUCOSE 109* 60* 209*  BUN 11 11 9   CREATININE 0.66 0.72 0.76  CALCIUM 8.8* 8.5* 8.0*     No results found for this or any previous visit (from the past 240 hour(s)).   Radiology Studies: CT HEAD WO CONTRAST ( )  Result Date: 09/30/2023 CLINICAL DATA:  57 year old female with delirium. EXAM: CT HEAD WITHOUT CONTRAST TECHNIQUE: Contiguous axial images were obtained from the base of the skull through the vertex without intravenous contrast. RADIATION DOSE REDUCTION: This exam was performed according to the departmental dose-optimization program which includes automated exposure control, adjustment of the mA and/or kV according to patient size and/or use of iterative reconstruction technique. COMPARISON:  Face CT 09/10/2023. FINDINGS: Brain: Mega cisterna magna normal variant. Overall brain volume within normal limits for age. No midline shift, ventriculomegaly, mass effect,  evidence of mass lesion, intracranial hemorrhage or evidence of cortically based acute infarction. Gray-white differentiation within normal limits throughout. Vascular: No suspicious intracranial vascular hyperdensity. Skull: No acute osseous abnormality identified. Sinuses/Orbits: Patchy partial mastoid air cell opacification appears unchanged. Tympanic cavities appear to remain aerated. Visible paranasal sinuses are stable and well aerated. Other: No acute orbit or scalp soft tissue finding. IMPRESSION: No acute intracranial abnormality. Negative for age noncontrast CT appearance of the brain. Electronically Signed   By: Odessa Fleming M.D.   On: 09/30/2023 08:56    Scheduled Meds:  famotidine  20 mg Oral Daily   predniSONE  40 mg Oral Q breakfast   Continuous Infusions:     LOS: 2 days   Burnadette Pop, MD [facility] P10/23/2024, 11:24 AM

## 2023-10-01 ENCOUNTER — Inpatient Hospital Stay (HOSPITAL_COMMUNITY): Payer: MEDICAID

## 2023-10-01 ENCOUNTER — Ambulatory Visit: Payer: Medicaid Other | Admitting: Dermatology

## 2023-10-01 DIAGNOSIS — R55 Syncope and collapse: Secondary | ICD-10-CM

## 2023-10-01 LAB — ECHOCARDIOGRAM COMPLETE
Area-P 1/2: 3.65 cm2
Calc EF: 64.6 %
Height: 61 in
S' Lateral: 3.2 cm
Single Plane A2C EF: 63.5 %
Single Plane A4C EF: 64.4 %
Weight: 2195.2 [oz_av]

## 2023-10-01 LAB — GLUCOSE, CAPILLARY: Glucose-Capillary: 208 mg/dL — ABNORMAL HIGH (ref 70–99)

## 2023-10-01 MED ORDER — SODIUM CHLORIDE 0.9 % IV SOLN: INTRAVENOUS | Status: DC

## 2023-10-01 MED ORDER — PREDNISONE 10 MG PO TABS: 10.0000 mg | ORAL_TABLET | Freq: Every day | ORAL | Status: DC

## 2023-10-01 MED ORDER — PREDNISONE 20 MG PO TABS
20.0000 mg | ORAL_TABLET | Freq: Every day | ORAL | Status: AC
  Administered 2023-10-02 – 2023-10-03 (×2): 20 mg via ORAL
  Filled 2023-10-01 (×2): qty 1

## 2023-10-01 NOTE — Progress Notes (Signed)
  Echocardiogram 2D Echocardiogram has been performed.  Janalyn Harder 10/01/2023, 10:06 AM

## 2023-10-01 NOTE — Evaluation (Signed)
Physical Therapy Evaluation Patient Details Name: Amy Flowers MRN: [id number] DOB: 29-Apr-1966 Today's Date: 10/01/2023  History of Present Illness  Pt is 57 yo presenting with tongue swelling. Pt initially presented on 10/3 with L sided jaw pain and was found to have bil middle ear effusion. She presented again to urgent care on 10/16 with reports of hives after eating mixed nuts; she presented to urgent care again without improvement and was sent to [facility] ED. OVF:[id number] dysmotility, GERD  Clinical Impression  Pt is presenting below baseline level of functioning. Prior to hospitalization pt was active with work and driving; independent with all ADL's and IADL's. Currently pt is CGA for bed mobility, sit to stand and gait with decreased blood pressures and possible syncopal episode. Pt was very fatigued after session. Pt encouraged to sit up on recliner and drink some water. Due to pt current functional status, home set up and available assistance at home no recommended skilled physical therapy services recommended at this time on discharge from acute care hospital setting. Will continue to follow in acute setting in order to ensure that pt returns home with decreased risk for falls, injury and re-hospitalization.               Equipment Recommendations None recommended by PT     Functional Status Assessment Patient has had a recent decline in their functional status and demonstrates the ability to make significant improvements in function in a reasonable and predictable amount of time.     Precautions / Restrictions Precautions Precautions: Fall Precaution Comments: watch BP Restrictions Weight Bearing Restrictions: No      Mobility  Bed Mobility Overal bed mobility: Needs Assistance Bed Mobility: Supine to Sit, Sit to Supine     Supine to sit: Contact guard Sit to supine: Contact guard assist   General bed mobility comments: CGA pt pulled up from therapist hand without physical  assist required to get to midline from supine. Pt was supervised in slow descent with possible syncopal like episode with controlled descent to bed with slow lowering of the trunk from pt with guarding from therapist to prevent injury after short walk. Pt then lifted her leg with UE assist onto the bed.    Transfers Overall transfer level: Needs assistance Equipment used: None Transfers: Sit to/from Stand, Bed to chair/wheelchair/BSC Sit to Stand: Contact guard assist   Step pivot transfers: Contact guard assist       General transfer comment: Pt performed sit to stand 3x throughout session at [facility] for safety without AD and good handplacement.    Ambulation/Gait Ambulation/Gait assistance: Contact guard assist Gait Distance (Feet): 50 Feet Assistive device: Rolling walker (2 wheels) Gait Pattern/deviations: Step-through pattern, Decreased stride length Gait velocity: decreased Gait velocity interpretation: 1.31 - 2.62 ft/sec, indicative of limited community ambulator   General Gait Details: slow pace. Pt BP dropped from 140/86 in sitting to 117/76 after short distance gait and pt had possible syncopal episode. Pt was alert throughout slowly suddenly descended to the bed with mostly controlled descent first to sitting then with the trunk to lying supine on the bed with guarding from the physical therapist.  Stairs Stairs:  (not applicable daughter reports level entry into home.)             Balance Overall balance assessment: Needs assistance Sitting-balance support: Bilateral upper extremity supported, Feet supported Sitting balance-Leahy Scale: Good     Standing balance support: Bilateral upper extremity supported, No upper extremity supported, During functional activity  Standing balance-Leahy Scale: Fair Standing balance comment: Pt has possible syncopal episode in standing after short distance gait.         Pertinent Vitals/Pain Pain Assessment Pain Assessment:  No/denies pain (pt reports she feels weak and "can't feel her body" per daughter. Pt states she feels light and deep touch she means it feels like she is weak and can't move her body possibly. Pt reported some chest tightness in sitting at the location of the  diaphragm)    Home Living Family/patient expects to be discharged to:: Private residence Living Arrangements: Children;Other relatives (lives with daughter and grand children) Available Help at Discharge: Family;Available 24 hours/day Type of Home: House Home Access: Level entry       Home Layout: One level Home Equipment: None      Prior Function Prior Level of Function : Independent/Modified Independent;Working/employed;Driving             Mobility Comments: pt was independent working at a job where she is on her feet all day per daughter ADLs Comments: pt is independent with ADL's and IADL"s.     Extremity/Trunk Assessment   Upper Extremity Assessment Upper Extremity Assessment: Generalized weakness    Lower Extremity Assessment Lower Extremity Assessment: Generalized weakness    Cervical / Trunk Assessment Cervical / Trunk Assessment: Normal  Communication   Communication Communication: Other (comment) (pt primary language is arabic) Cueing Techniques: Verbal cues;Gestural cues;Tactile cues;Visual cues  Cognition Arousal: Alert Behavior During Therapy: Flat affect (pt was tearful upon entering the room.) Overall Cognitive Status: Within Functional Limits for tasks assessed   General Comments: upon entering the room with video interpretor pt daughter states that they do not need an interpretor and pt is in agreement. Daughter interpreted throughout session. Pt was tearful upon entering the room and pt daughter states pt does not feel well. When asked pt did not have a specific complaint she feels "weak all over."        General Comments General comments (skin integrity, edema, etc.): Daughter present  throughout session. Very supportive and interpreting on pt and daughter request.        Assessment/Plan    PT Assessment Patient needs continued PT services  PT Problem List Decreased mobility;Decreased activity tolerance       PT Treatment Interventions Therapeutic exercise;DME instruction;Gait training;Balance training;Functional mobility training;Therapeutic activities;Patient/family education    PT Goals (Current goals can be found in the Care Plan section)  Acute Rehab PT Goals Patient Stated Goal: to feel better PT Goal Formulation: With patient/family Time For Goal Achievement: 10/15/23 Potential to Achieve Goals: Good    Frequency Min 1X/week        AM-PAC PT "6 Clicks" Mobility  Outcome Measure Help needed turning from your back to your side while in a flat bed without using bedrails?: A Little Help needed moving from lying on your back to sitting on the side of a flat bed without using bedrails?: A Little Help needed moving to and from a bed to a chair (including a wheelchair)?: A Little Help needed standing up from a chair using your arms (e.g., wheelchair or bedside chair)?: A Little Help needed to walk in hospital room?: A Little Help needed climbing 3-5 steps with a railing? : A Little 6 Click Score: 18    End of Session Equipment Utilized During Treatment: Gait belt Activity Tolerance: Treatment limited secondary to medical complications (Comment) (decreased BP after gait) Patient left: in chair;with call bell/phone within reach;with family/visitor present  Nurse Communication: Mobility status PT Visit Diagnosis: Other abnormalities of gait and mobility (R26.89)    Time: 1610-9604 PT Time Calculation (min) (ACUTE ONLY): 28 min   Charges:   PT Evaluation $PT Eval Low Complexity: 1 Low PT Treatments $Therapeutic Activity: 8-22 mins PT General Charges $$ ACUTE PT VISIT: 1 Visit        Harrel Carina, DPT, CLT  Acute Rehabilitation  Services Office: [phone] (Secure chat preferred)   Claudia Desanctis 10/01/2023, 9:33 AM

## 2023-10-01 NOTE — Plan of Care (Signed)

## 2023-10-01 NOTE — Progress Notes (Addendum)
    10/01/23 1527  TOC Brief Assessment  Insurance and Status Lapsed  Patient has primary care physician Yes  Home environment has been reviewed yes  Prior level of function: independent  Prior/Current Home Services No current home services  Social Determinants of Health Reivew SDOH reviewed needs interventions  Readmission risk has been reviewed Yes  Transition of care needs transition of care needs identified, TOC will continue to follow   Referral to [facility]   PCP appointment October 20, 2023 at 1550 , information placed on AVS.   Pharmacy changed to [facility]   Housing Insecurity marked. PAtient declined to answer questions. Resources placed on AVS.   TOC will continue to follow     She currently has Family Planning Medicaid only.  Dionicia Abler is working on submitting an application to see if her coverage can be converted to full coverage

## 2023-10-01 NOTE — Progress Notes (Signed)
swelling.  Had a near syncopal event and orthostatic while working with PT so we discussed with the daughter at bedside about keeping her today for IV fluids  Objective: Vitals:   09/30/23 2016 10/01/23 0349 10/01/23 0930 10/01/23 0932  BP: 121/67 121/67 133/71   Pulse: 75 67 77   Resp:  20 18 18    Temp: 98.4 F (36.9 C) 97.8 F (36.6 C) (!) 97.5 F (36.4 C)   TempSrc: Oral Oral Oral   SpO2: 99% 99% 100% 100%  Weight:      Height:       No intake or output data in the 24 hours ending 10/01/23 1205  Filed Weights   09/28/23 1246 09/30/23 0542  Weight: 70 kg 62.2 kg    Examination:  General exam: Overall comfortable, not in distress HEENT: PERRL Respiratory system:  no wheezes or crackles  Cardiovascular system: S1 & S2 heard, RRR.  Gastrointestinal system: Abdomen is nondistended, soft and nontender. Central nervous system: Alert and oriented Extremities: No edema, no clubbing ,no cyanosis Skin: Scattered purpuric rash   Data Reviewed: I have personally reviewed following labs and imaging studies  CBC: Recent Labs  Lab 09/27/23 0206 09/28/23 1251 09/29/23 0045  WBC 8.7 11.8* 6.6  NEUTROABS 5.3  --   --   HGB 13.8 13.9 12.9  HCT 40.2 40.9 38.3  MCV 91.2 93.0 92.7  PLT 197 250 235   Basic Metabolic Panel: Recent Labs  Lab 09/27/23 0206 09/28/23 1251 09/29/23 0045  NA 137 140 140  K 4.0 3.1* 3.5  CL 105 107 107  CO2 25 26 24   GLUCOSE 109* 60* 209*  BUN 11 11 9   CREATININE 0.66 0.72 0.76  CALCIUM 8.8* 8.5* 8.0*     No results found for this or any previous visit (from the past 240 hour(s)).   Radiology Studies: ECHOCARDIOGRAM COMPLETE  Result Date: 10/01/2023    ECHOCARDIOGRAM REPORT   Patient Name:   Amy Flowers Date of Exam: 10/01/2023 Medical Rec #:  [id number]  Height:       61.0 in Accession #:    [id number] Weight:       137.2 lb Date of Birth:  1966-10-21  BSA:          1.609 m Patient Age:    57 years   BP:           121/67 mmHg Patient Gender: F          HR:           75 bpm. Exam Location:  Inpatient Procedure: 2D Echo, Cardiac Doppler and Color Doppler Indications:    R55 Syncope  History:        Patient has no prior history of Echocardiogram examinations.                 Signs/Symptoms:Shortness of Breath and Dyspnea.   Sonographer:    Sheralyn Boatman RDCS Referring Phys: [id number] Hays Dunnigan IMPRESSIONS  1. Left ventricular ejection fraction, by estimation, is 55 to 60%. The left ventricle has normal function. The left ventricle has no regional wall motion abnormalities. Left ventricular diastolic parameters were normal.  2. Right ventricular systolic function is normal. The right ventricular size is normal. There is normal pulmonary artery systolic pressure.  3. The mitral valve is normal in structure. Trivial mitral valve regurgitation. No evidence of mitral stenosis.  4. The aortic valve is tricuspid. Aortic valve regurgitation is not visualized. No aortic stenosis is present.  swelling.  Had a near syncopal event and orthostatic while working with PT so we discussed with the daughter at bedside about keeping her today for IV fluids  Objective: Vitals:   09/30/23 2016 10/01/23 0349 10/01/23 0930 10/01/23 0932  BP: 121/67 121/67 133/71   Pulse: 75 67 77   Resp:  20 18 18    Temp: 98.4 F (36.9 C) 97.8 F (36.6 C) (!) 97.5 F (36.4 C)   TempSrc: Oral Oral Oral   SpO2: 99% 99% 100% 100%  Weight:      Height:       No intake or output data in the 24 hours ending 10/01/23 1205  Filed Weights   09/28/23 1246 09/30/23 0542  Weight: 70 kg 62.2 kg    Examination:  General exam: Overall comfortable, not in distress HEENT: PERRL Respiratory system:  no wheezes or crackles  Cardiovascular system: S1 & S2 heard, RRR.  Gastrointestinal system: Abdomen is nondistended, soft and nontender. Central nervous system: Alert and oriented Extremities: No edema, no clubbing ,no cyanosis Skin: Scattered purpuric rash   Data Reviewed: I have personally reviewed following labs and imaging studies  CBC: Recent Labs  Lab 09/27/23 0206 09/28/23 1251 09/29/23 0045  WBC 8.7 11.8* 6.6  NEUTROABS 5.3  --   --   HGB 13.8 13.9 12.9  HCT 40.2 40.9 38.3  MCV 91.2 93.0 92.7  PLT 197 250 235   Basic Metabolic Panel: Recent Labs  Lab 09/27/23 0206 09/28/23 1251 09/29/23 0045  NA 137 140 140  K 4.0 3.1* 3.5  CL 105 107 107  CO2 25 26 24   GLUCOSE 109* 60* 209*  BUN 11 11 9   CREATININE 0.66 0.72 0.76  CALCIUM 8.8* 8.5* 8.0*     No results found for this or any previous visit (from the past 240 hour(s)).   Radiology Studies: ECHOCARDIOGRAM COMPLETE  Result Date: 10/01/2023    ECHOCARDIOGRAM REPORT   Patient Name:   Amy Flowers Date of Exam: 10/01/2023 Medical Rec #:  [id number]  Height:       61.0 in Accession #:    [id number] Weight:       137.2 lb Date of Birth:  1966-10-21  BSA:          1.609 m Patient Age:    57 years   BP:           121/67 mmHg Patient Gender: F          HR:           75 bpm. Exam Location:  Inpatient Procedure: 2D Echo, Cardiac Doppler and Color Doppler Indications:    R55 Syncope  History:        Patient has no prior history of Echocardiogram examinations.                 Signs/Symptoms:Shortness of Breath and Dyspnea.   Sonographer:    Sheralyn Boatman RDCS Referring Phys: [id number] Hays Dunnigan IMPRESSIONS  1. Left ventricular ejection fraction, by estimation, is 55 to 60%. The left ventricle has normal function. The left ventricle has no regional wall motion abnormalities. Left ventricular diastolic parameters were normal.  2. Right ventricular systolic function is normal. The right ventricular size is normal. There is normal pulmonary artery systolic pressure.  3. The mitral valve is normal in structure. Trivial mitral valve regurgitation. No evidence of mitral stenosis.  4. The aortic valve is tricuspid. Aortic valve regurgitation is not visualized. No aortic stenosis is present.  PROGRESS NOTE  [name]  WUJ:[id number] DOB: 1966/01/12 DOA: 09/28/2023 PCP: Hoy Register, MD   Brief Narrative: Patient is a 57 year old female with history of esophageal dysmotility, GERD Arabic speaking female  who presented with tongue swelling.  She actually presented to ED on 09/10/2023 with complaint of left-sided jaw pain, found to have bilateral middle ear effusion without any signs of infection and was discharged on Zyrtec, Flonase.  Presented back to ED the following day with jaw pain, CT maxillofacial did not show any acute fracture but showed degenerative arthritis of both TMJs.  She presented again to the urgent care on 10/16 with complaint of hives after eating mixed nuts, she was treated with IM steroid and discharged on steroid taper.  She presented again to urgent care with no improvement.  At this time she was referred to the ED.She complained of no tongue swelling, difficulty swallowing, shortness of breath. Started on IV steroids, antihistamines.   Lab work showed mild leukocytosis.  EpiPen was given.  PCCM was also contacted but she became more responsive so formal consult not done. Patient had total of 3 episodes of brief syncopal events.Work up has been negative.  Found to be orthostatic while working with physical therapy this morning, started on IV fluid.  Check orthostatic vitals again tomorrow morning, if negative, can be discharged home  Assessment & Plan:  Principal Problem:   Anaphylaxis Active Problems:   Vasculitis (HCC)   Angioedema   Hypokalemia  Anaphylaxis: Presented earlier with hives to the urgent care, was discharged on the steroid with did not help.  Presented with progression of symptoms with tongue swelling, shortness of breath, difficulty swallowing.  Also became unresponsive in the ED.  Given EpiPen, IV Solu-Medrol. Continue supportive care, H1 and H2 blocker.  Steroids changed to oral.  Continue tapering Currently respiratory status stable.  On  room air.  Vitals good. Not sure what is allergic to.  Recurrent syncopal episodes: Unclear etiology.  Patient remains hemodynamically stable but suddenly  becomes unresponsive for short period of time.  Happened 3 times during this hospitalization.  Not postictal.  No prodromal syndromes.  Vitals always remained good.  Remains alert and oriented after the event.   CT head did not show any acute acute findings as well as MRI of the brain.  EEG negative for seizure.  Echo showed normal EF, no valvular abnormalities. While working with the physical therapist today, she was found to be orthostatic and had a near syncopal event again.  Started on IV fluid.  Check orthostatic vitals tomorrow morning.  Purpuric rash:vasculitis?:  Has nonblanchable purpuric rash which is generalized.  This is is not associated with her current allergic reaction.  Unclear etiology.  ESR, ANA negative.  Other labs stable. She had an appointment dermatology on 10/23 but had to be canceled.  I have recommended her daughter to get another appointment.  Hypokalemia: Supplemented         DVT prophylaxis:SCDs Start: 09/28/23 2258     Code Status: Full Code  Family Communication: Discussed with daughter at bedside  Patient status: Inpatient  Patient is from : Home  Anticipated discharge to: Home  Estimated DC date: tomorrow   Consultants: None  Procedures: None  Antimicrobials:  Anti-infectives (From admission, onward)    None       Subjective: Patient seen and examined the bedside today.  She looks more comfortable than yesterday.  Alert and oriented.  Denies any lightheadedness or dizziness.  Denies any tongue  swelling.  Had a near syncopal event and orthostatic while working with PT so we discussed with the daughter at bedside about keeping her today for IV fluids  Objective: Vitals:   09/30/23 2016 10/01/23 0349 10/01/23 0930 10/01/23 0932  BP: 121/67 121/67 133/71   Pulse: 75 67 77   Resp:  20 18 18    Temp: 98.4 F (36.9 C) 97.8 F (36.6 C) (!) 97.5 F (36.4 C)   TempSrc: Oral Oral Oral   SpO2: 99% 99% 100% 100%  Weight:      Height:       No intake or output data in the 24 hours ending 10/01/23 1205  Filed Weights   09/28/23 1246 09/30/23 0542  Weight: 70 kg 62.2 kg    Examination:  General exam: Overall comfortable, not in distress HEENT: PERRL Respiratory system:  no wheezes or crackles  Cardiovascular system: S1 & S2 heard, RRR.  Gastrointestinal system: Abdomen is nondistended, soft and nontender. Central nervous system: Alert and oriented Extremities: No edema, no clubbing ,no cyanosis Skin: Scattered purpuric rash   Data Reviewed: I have personally reviewed following labs and imaging studies  CBC: Recent Labs  Lab 09/27/23 0206 09/28/23 1251 09/29/23 0045  WBC 8.7 11.8* 6.6  NEUTROABS 5.3  --   --   HGB 13.8 13.9 12.9  HCT 40.2 40.9 38.3  MCV 91.2 93.0 92.7  PLT 197 250 235   Basic Metabolic Panel: Recent Labs  Lab 09/27/23 0206 09/28/23 1251 09/29/23 0045  NA 137 140 140  K 4.0 3.1* 3.5  CL 105 107 107  CO2 25 26 24   GLUCOSE 109* 60* 209*  BUN 11 11 9   CREATININE 0.66 0.72 0.76  CALCIUM 8.8* 8.5* 8.0*     No results found for this or any previous visit (from the past 240 hour(s)).   Radiology Studies: ECHOCARDIOGRAM COMPLETE  Result Date: 10/01/2023    ECHOCARDIOGRAM REPORT   Patient Name:   Amy Flowers Date of Exam: 10/01/2023 Medical Rec #:  [id number]  Height:       61.0 in Accession #:    [id number] Weight:       137.2 lb Date of Birth:  1966-10-21  BSA:          1.609 m Patient Age:    57 years   BP:           121/67 mmHg Patient Gender: F          HR:           75 bpm. Exam Location:  Inpatient Procedure: 2D Echo, Cardiac Doppler and Color Doppler Indications:    R55 Syncope  History:        Patient has no prior history of Echocardiogram examinations.                 Signs/Symptoms:Shortness of Breath and Dyspnea.   Sonographer:    Sheralyn Boatman RDCS Referring Phys: [id number] Hays Dunnigan IMPRESSIONS  1. Left ventricular ejection fraction, by estimation, is 55 to 60%. The left ventricle has normal function. The left ventricle has no regional wall motion abnormalities. Left ventricular diastolic parameters were normal.  2. Right ventricular systolic function is normal. The right ventricular size is normal. There is normal pulmonary artery systolic pressure.  3. The mitral valve is normal in structure. Trivial mitral valve regurgitation. No evidence of mitral stenosis.  4. The aortic valve is tricuspid. Aortic valve regurgitation is not visualized. No aortic stenosis is present.  PROGRESS NOTE  [name]  WUJ:[id number] DOB: 1966/01/12 DOA: 09/28/2023 PCP: Hoy Register, MD   Brief Narrative: Patient is a 57 year old female with history of esophageal dysmotility, GERD Arabic speaking female  who presented with tongue swelling.  She actually presented to ED on 09/10/2023 with complaint of left-sided jaw pain, found to have bilateral middle ear effusion without any signs of infection and was discharged on Zyrtec, Flonase.  Presented back to ED the following day with jaw pain, CT maxillofacial did not show any acute fracture but showed degenerative arthritis of both TMJs.  She presented again to the urgent care on 10/16 with complaint of hives after eating mixed nuts, she was treated with IM steroid and discharged on steroid taper.  She presented again to urgent care with no improvement.  At this time she was referred to the ED.She complained of no tongue swelling, difficulty swallowing, shortness of breath. Started on IV steroids, antihistamines.   Lab work showed mild leukocytosis.  EpiPen was given.  PCCM was also contacted but she became more responsive so formal consult not done. Patient had total of 3 episodes of brief syncopal events.Work up has been negative.  Found to be orthostatic while working with physical therapy this morning, started on IV fluid.  Check orthostatic vitals again tomorrow morning, if negative, can be discharged home  Assessment & Plan:  Principal Problem:   Anaphylaxis Active Problems:   Vasculitis (HCC)   Angioedema   Hypokalemia  Anaphylaxis: Presented earlier with hives to the urgent care, was discharged on the steroid with did not help.  Presented with progression of symptoms with tongue swelling, shortness of breath, difficulty swallowing.  Also became unresponsive in the ED.  Given EpiPen, IV Solu-Medrol. Continue supportive care, H1 and H2 blocker.  Steroids changed to oral.  Continue tapering Currently respiratory status stable.  On  room air.  Vitals good. Not sure what is allergic to.  Recurrent syncopal episodes: Unclear etiology.  Patient remains hemodynamically stable but suddenly  becomes unresponsive for short period of time.  Happened 3 times during this hospitalization.  Not postictal.  No prodromal syndromes.  Vitals always remained good.  Remains alert and oriented after the event.   CT head did not show any acute acute findings as well as MRI of the brain.  EEG negative for seizure.  Echo showed normal EF, no valvular abnormalities. While working with the physical therapist today, she was found to be orthostatic and had a near syncopal event again.  Started on IV fluid.  Check orthostatic vitals tomorrow morning.  Purpuric rash:vasculitis?:  Has nonblanchable purpuric rash which is generalized.  This is is not associated with her current allergic reaction.  Unclear etiology.  ESR, ANA negative.  Other labs stable. She had an appointment dermatology on 10/23 but had to be canceled.  I have recommended her daughter to get another appointment.  Hypokalemia: Supplemented         DVT prophylaxis:SCDs Start: 09/28/23 2258     Code Status: Full Code  Family Communication: Discussed with daughter at bedside  Patient status: Inpatient  Patient is from : Home  Anticipated discharge to: Home  Estimated DC date: tomorrow   Consultants: None  Procedures: None  Antimicrobials:  Anti-infectives (From admission, onward)    None       Subjective: Patient seen and examined the bedside today.  She looks more comfortable than yesterday.  Alert and oriented.  Denies any lightheadedness or dizziness.  Denies any tongue

## 2023-10-02 DIAGNOSIS — T782XXD Anaphylactic shock, unspecified, subsequent encounter: Secondary | ICD-10-CM

## 2023-10-02 LAB — VITAMIN B12: Vitamin B-12: 858 pg/mL (ref 180–914)

## 2023-10-02 LAB — FOLATE: Folate: 21.7 ng/mL (ref >5.9)

## 2023-10-02 MED ORDER — ALPRAZOLAM 0.25 MG PO TABS: 0.2500 mg | ORAL_TABLET | Freq: Three times a day (TID) | ORAL | Status: DC | PRN

## 2023-10-02 MED ORDER — HYDROCORTISONE 1 % EX CREA
TOPICAL_CREAM | Freq: Four times a day (QID) | CUTANEOUS | Status: DC | PRN
  Filled 2023-10-02: qty 28

## 2023-10-02 MED ORDER — LORATADINE 10 MG PO TABS
10.0000 mg | ORAL_TABLET | Freq: Every day | ORAL | Status: DC
  Administered 2023-10-02 – 2023-10-03 (×2): 10 mg via ORAL
  Filled 2023-10-02 (×2): qty 1

## 2023-10-02 NOTE — Progress Notes (Signed)
Pt told the interpret she feels like she going into a coma, this nurse check bp and pulse see EPIC for results. Pt is resting in bed , respiration are equal and unlabored at this time. Pt is also complaining of dizziness. Notified MD via secure chat. MD place new orders. Will endorse to oncoming nurse to monitor pt for any changes.

## 2023-10-02 NOTE — Progress Notes (Signed)
   10/02/23 1310  Mobility  Activity Ambulated with assistance in hallway  Level of Assistance Contact guard assist, steadying assist  Assistive Device Front wheel walker  Distance Ambulated (ft) 150 ft  Activity Response Tolerated fair  Mobility Referral Yes  $Mobility charge 1 Mobility  Mobility Specialist Start Time (ACUTE ONLY) 1254  Mobility Specialist Stop Time (ACUTE ONLY) 1302  Mobility Specialist Time Calculation (min) (ACUTE ONLY) 8 min   Mobility Specialist: Progress Note  Pre- Mobility: BP 129/74(90) Post- Mobility: BP 126/82 (96)  Pt agreeable to mobility session - received in bed. Required CG using RW. C/o slight dizziness and generalized weakness towards EOS. Returned to bed with all needs met - call bell within reach. Daughter present.   Pt had all four railings up at [facility].   Barnie Mort, BS Mobility Specialist Please contact via SecureChat or Rehab office at [phone].

## 2023-10-02 NOTE — Progress Notes (Signed)
Amy Flowers  QQV:[id number] DOB: 10-07-1966 DOA: 09/28/2023 PCP: Hoy Register, MD    Brief Narrative:  57 year old Arabic speaking female with a history of esophageal dysmotility/GERD and hiatal hernia who was admitted to the hospital 09/28/2023 after an ongoing history of an allergic type reaction after eating mixed nuts beginning as diffuse urticaria and then progressing to the point of tongue swelling and shortness of breath.  During the course of her treatment she has suffered 3 episodes of brief syncopal spells defined primarily as her becoming suddenly unresponsive but remaining stable otherwise.  Goals of Care:   Code Status: Full Code   DVT prophylaxis: SCDs Start: 09/28/23 2258   Interim Hx: Early this morning/late last night the patient began to complain of feeling "weak and numb all over."  This occurred multiple times through the night.  She remained stable from a vitals standpoint.  No clear etiology was elucidated.  She is resting comfortably in bed when I entered the room and introduced myself.  When I am auscultating her breath sounds she quits responding to my questions.  There is no evidence of convulsive type behavior or muscle contractions.  There is no twitching of the eyes.  This persists for approximately 35 seconds.  When I apply deep pressure to the nailbed of one of her fingers she grimaces and suddenly opens her eyes.  Assessment & Plan:  Allergic reaction/anaphylaxis Initial treatment during hives stage was steroid which patient reports did not improve her symptoms -she then progressed to the point of tongue swelling shortness of breath and difficulty swallowing which appeared to respond to epinephrine and IV Solu-Medrol as well as H1 and H2 blockers -has remained stable from a respiratory standpoint throughout her hospital stay with stable vitals -today I am unable to appreciate any significant rash on exam -there is no erythema sloughing or ulceration of the oral  or pharyngeal mucosa and no evidence of tongue swelling -I have counseled the patient and her daughter, who serves as interpreter, that the patient will need to see an allergist as an outpatient to have testing but that in the interim she should avoid all nuts that were present in the mixed nut mix that she consumed prior to the onset of her symptoms -Taper steroid to off -continue H1 and H2 blockers  Purpuric rash Diffuse nonblanchable purpuric rash -ESR and ANA negative -outpatient dermatology follow-up suggested -essentially resolved at time of exam today  Recurrent episodes of decreased responsiveness  Etiology unclear -hemodynamically stable throughout all episodes -no postictal phase -no prodromal syndromes -CT head without acute findings -MRI brain without acute findings -EEG without evidence of seizure activity -TTE noted normal EF with no valvular abnormalities -was noted to be orthostatic when working with PT 10/24 but this has resolved on follow-up check 10/25 after volume expansion -check B12 folic acid and cortisol levels -TSH recently noted to be normal - ?anxiety related -give trial of low-dose Xanax  Hypokalemia Corrected with supplementation  Family Communication: Spoke with daughter at bedside at length Disposition: Anticipate discharge home 10/26   Objective: Blood pressure 138/71, pulse 78, temperature 98.6 F (37 C), temperature source Oral, resp. rate 16, height 5\' 1"  (1.549 m), weight 62.2 kg, SpO2 100%.  Intake/Output Summary (Last 24 hours) at 10/02/2023 1003 Last data filed at 10/02/2023 0600 Gross per 24 hour  Intake 1965.44 ml  Output --  Net 1965.44 ml   Filed Weights   09/28/23 1246 09/30/23 0542  Weight: 70 kg 62.2 kg  Examination: General: No acute respiratory distress Lungs: Clear to auscultation bilaterally without wheezes or crackles Cardiovascular: Regular rate and rhythm without murmur gallop or rub normal S1 and S2 Abdomen: Nontender,  nondistended, soft, bowel sounds positive, no rebound, no ascites, no appreciable mass Extremities: No significant cyanosis, clubbing, or edema bilateral lower extremities  CBC: Recent Labs  Lab 09/27/23 0206 09/28/23 1251 09/29/23 0045  WBC 8.7 11.8* 6.6  NEUTROABS 5.3  --   --   HGB 13.8 13.9 12.9  HCT 40.2 40.9 38.3  MCV 91.2 93.0 92.7  PLT 197 250 235   Basic Metabolic Panel: Recent Labs  Lab 09/27/23 0206 09/28/23 1251 09/29/23 0045  NA 137 140 140  K 4.0 3.1* 3.5  CL 105 107 107  CO2 25 26 24   GLUCOSE 109* 60* 209*  BUN 11 11 9   CREATININE 0.66 0.72 0.76  CALCIUM 8.8* 8.5* 8.0*   GFR: Estimated Creatinine Clearance: 64.1 mL/min (by C-G formula based on SCr of 0.76 mg/dL).   Scheduled Meds:  famotidine  20 mg Oral Daily   pantoprazole  40 mg Oral Daily   [START ON 10/04/2023] predniSONE  10 mg Oral Q breakfast   predniSONE  20 mg Oral Q breakfast     LOS: 4 days   Lonia Blood, MD [facility]  [phone] Pager - Text Page per Loretha Stapler  If 7PM-7AM, please contact night-coverage per Amion 10/02/2023, 10:03 AM

## 2023-10-02 NOTE — Progress Notes (Signed)
2230: While assessing the pt, pt stated she has been suddenly feels very weak, and also feels numbness everywhere in her body. Pt said this happened about 5 times. VS Taken, and stable, checked her blood glucose and it is 208 now. On call provider notified who stated to monitor the pt. At 0340 pt c/o pt had another episode of weakness, and numbness, she said she feels heaviness in her chest, no chest pain. Her VS were stable, on call provider notified. STAT EKG ordered. According to on call order to continue monitoring the pt .

## 2023-10-02 NOTE — Plan of Care (Signed)
  Problem: Clinical Measurements: Goal: Will remain free from infection Outcome: Progressing Goal: Diagnostic test results will improve Outcome: Progressing Goal: Cardiovascular complication will be avoided Outcome: Progressing   Problem: Activity: Goal: Risk for activity intolerance will decrease Outcome: Progressing   

## 2023-10-03 ENCOUNTER — Other Ambulatory Visit (HOSPITAL_COMMUNITY): Payer: Self-pay

## 2023-10-03 LAB — COMPREHENSIVE METABOLIC PANEL
ALT: 22 U/L (ref 0–44)
AST: 16 U/L (ref 15–41)
Albumin: 2.8 g/dL — ABNORMAL LOW (ref 3.5–5.0)
Alkaline Phosphatase: 49 U/L (ref 38–126)
Anion gap: 9 (ref 5–15)
BUN: 8 mg/dL (ref 6–20)
CO2: 26 mmol/L (ref 22–32)
Calcium: 8.9 mg/dL (ref 8.9–10.3)
Chloride: 103 mmol/L (ref 98–111)
Creatinine, Ser: 0.71 mg/dL (ref 0.44–1.00)
GFR, Estimated: >60 mL/min (ref >60)
Glucose, Bld: 110 mg/dL — ABNORMAL HIGH (ref 70–99)
Potassium: 3.3 mmol/L — ABNORMAL LOW (ref 3.5–5.1)
Sodium: 138 mmol/L (ref 135–145)
Total Bilirubin: 0.6 mg/dL (ref 0.3–1.2)
Total Protein: 5.8 g/dL — ABNORMAL LOW (ref 6.5–8.1)

## 2023-10-03 LAB — CBC
HCT: 40.3 % (ref 36.0–46.0)
Hemoglobin: 13.9 g/dL (ref 12.0–15.0)
MCH: 31.2 pg (ref 26.0–34.0)
MCHC: 34.5 g/dL (ref 30.0–36.0)
MCV: 90.6 fL (ref 80.0–100.0)
Platelets: 225 10*3/uL (ref 150–400)
RBC: 4.45 MIL/uL (ref 3.87–5.11)
RDW: 13.1 % (ref 11.5–15.5)
WBC: 9.1 10*3/uL (ref 4.0–10.5)
nRBC: 0 % (ref 0.0–0.2)

## 2023-10-03 LAB — TROPONIN I (HIGH SENSITIVITY): Troponin I (High Sensitivity): 6 ng/L (ref <18)

## 2023-10-03 LAB — CORTISOL: Cortisol, Plasma: 0.7 ug/dL

## 2023-10-03 MED ORDER — PREDNISONE 10 MG PO TABS
10.0000 mg | ORAL_TABLET | Freq: Every day | ORAL | 0 refills | Status: DC
  Filled 2023-10-03: qty 7, 7d supply, fill #0

## 2023-10-03 MED ORDER — EPINEPHRINE 0.3 MG/0.3ML IJ SOAJ
0.3000 mg | INTRAMUSCULAR | 0 refills | Status: DC | PRN
Start: 2023-10-03 — End: 2023-10-03
  Filled 2023-10-03: qty 1, fill #0

## 2023-10-03 MED ORDER — HYDROCORTISONE 1 % EX CREA
TOPICAL_CREAM | Freq: Four times a day (QID) | CUTANEOUS | 0 refills | Status: DC | PRN
  Filled 2023-10-03: qty 28, 30d supply, fill #0

## 2023-10-03 MED ORDER — FAMOTIDINE 20 MG PO TABS
20.0000 mg | ORAL_TABLET | Freq: Every day | ORAL | 0 refills | Status: DC
  Filled 2023-10-03: qty 30, 30d supply, fill #0

## 2023-10-03 MED ORDER — EPINEPHRINE 0.3 MG/0.3ML IJ SOAJ
0.3000 mg | INTRAMUSCULAR | 0 refills | Status: DC | PRN
  Filled 2023-10-03: qty 2, 14d supply, fill #0

## 2023-10-03 MED ORDER — LORATADINE 10 MG PO TABS
10.0000 mg | ORAL_TABLET | Freq: Every day | ORAL | 0 refills | Status: DC
  Filled 2023-10-03: qty 30, 30d supply, fill #0

## 2023-10-03 MED ORDER — KETOROLAC TROMETHAMINE 15 MG/ML IJ SOLN
30.0000 mg | Freq: Once | INTRAMUSCULAR | Status: AC
  Administered 2023-10-03: 30 mg via INTRAVENOUS
  Filled 2023-10-03: qty 2

## 2023-10-03 NOTE — Progress Notes (Signed)
   10/03/23   To Whom it may concern,  Amy Flowers was hospitalized at [facility] from 09/28/2023 through 10/03/2023.  She has been advised that she should not return to work until she sees her primary care provider in the outpatient clinic for a re-evaluation. It is expected that she will be out of work at least until October 12, 2023, but possibly longer depending upon her follow up visit.    Should you have further questions please feel free to contact our office.  Be advised that no medical information will be divulged without a signed HIPA release from the patient.    Sincerely,  Lonia Blood, MD [facility]  [phone]

## 2023-10-03 NOTE — Plan of Care (Signed)

## 2023-10-03 NOTE — Progress Notes (Signed)
Patient Name: Amy Flowers           DOB: 07-17-1966  MRN: [id number]      Admission Date: 09/28/2023  Attending Provider: Lonia Blood, MD  Primary Diagnosis: Anaphylaxis   Level of care: Med-Surg    CROSS COVER NOTE   Date of Service   10/03/2023   [name], 57 y.o. female, was admitted on 09/28/2023 for Anaphylaxis.    HPI/Events of Note   Patient reports sudden weakness episode followed by chest discomfort.  Patient is hemodynamically stable and RN does not report distress at this time.  Chest discomfort Described as intermittent tightness to mid chest, 8/10 pain. Pain does not worsen with deep inspiration, however nurse reports that patient has soreness to chest wall on palpation.   No palpitations, nausea, vomiting, abdominal discomfort reported.   Although patient denies being anxious, RN reports she is tearful.   Interventions/ Plan   EKG- NSR Toradol Xanax as needed Troponin- negative        Anthoney Harada, DNP, ACNPC- AG [facility]

## 2023-10-03 NOTE — Discharge Summary (Signed)
DISCHARGE SUMMARY  Amy Flowers  MR#: [id number]  DOB:[birth year]  Date of Admission: 09/28/2023 Date of Discharge: 10/03/2023  Attending Physician:Bengie Kaucher Silvestre Gunner, MD  Patient's YQM:VHQION, Odette Horns, MD  Disposition: D/C home   Follow-up Appts:  Follow-up Information     Hoy Register, MD Follow up in 5 day(s).   Specialty: Family Medicine Why: Call to make an appointment in 5 days for a follow up - also keep the appointment that has already been scheduled for October 20, 2023 at 3:50 Contact information: [address] [place] [address] [phone]                 Tests Needing Follow-up: -refer patient to an allergy/immunology clinic for allergy testing (an ambulatory referral was made at the time of d/c - f/u of this referral is advisable as the patient needs extensive food allergy testing) -if cutaneuos rash recurs, referral to a dermatologist would be advisable   Discharge Diagnoses: Allergic reaction/anaphylaxis Purpuric rash Recurrent episodes of decreased responsiveness  Hypokalemia   Initial presentation: 57 year old Arabic speaking female with a history of esophageal dysmotility/GERD and hiatal hernia who was admitted to the hospital 09/28/2023 after an ongoing history of an allergic type reaction after eating mixed nuts beginning as diffuse urticaria and then progressing to the point of tongue swelling and shortness of breath. During the course of her treatment she has suffered 3 episodes of brief "syncopal spells" defined primarily as her becoming suddenly unresponsive but remaining stable otherwise.   Hospital Course:  Allergic reaction/anaphylaxis Initial treatment during hives stage was steroid which patient reports did not improve her symptoms -she then progressed to the point of tongue swelling shortness of breath and difficulty swallowing which appeared to respond to epinephrine and IV Solu-Medrol as well as H1 and H2  blockers -has remained stable from a respiratory standpoint throughout her hospital stay with stable vitals - there was no erythema sloughing or ulceration of the oral or pharyngeal mucosa and no evidence of tongue swelling on 10/25 or 10/26 - I have counseled the patient and her daughter, who serves as interpreter, that the patient will need to see an allergist as an outpatient to have testing but that in the interim she should avoid all nuts that were present in the mixed nut mix that she consumed prior to the onset of her symptoms - cont low dose steroid for an additional 7 days -continue H1 and H2 blockers    Purpuric rash Diffuse nonblanchable purpuric rash -ESR and ANA negative -outpatient dermatology follow-up suggested -essentially resolved at time of discharge    Recurrent episodes of decreased responsiveness  Etiology unclear -hemodynamically stable throughout all episodes -no postictal phase -no prodromal syndromes -CT head without acute findings -MRI brain without acute findings -EEG without evidence of seizure activity -TTE noted normal EF with no valvular abnormalities -was noted to be orthostatic when working with PT 10/24 but this has resolved on follow-up check 10/25 after volume expansion - B12 and folic acid levels are not low - cortisol level checked while on solumedrol/prednisone therefore not felt to be accurate/useful  -TSH recently noted to be normal - ?anxiety related - patient advised that she must not drive until this is figured out or resolved for at least 6 months - on 10/25 it did appear to this examiner that an episode was acutely aborted by applying deep pressure to a nail bed on her hand    Hypokalemia Corrected with supplementation  Allergies as  of 10/03/2023   No Known Allergies      Medication List     TAKE these medications    acetaminophen 325 MG tablet Commonly known as: Tylenol Take 2 tablets (650 mg total) by mouth every 6 (six) hours as needed. What  changed: reasons to take this   cholecalciferol 25 MCG (1000 UNIT) tablet Commonly known as: VITAMIN D3 Take 1,000 Units by mouth daily.   EPINEPHrine 0.3 mg/0.3 mL Soaj injection Commonly known as: EPI-PEN Inject 0.3 mg into the muscle as needed for anaphylaxis.   famotidine 20 MG tablet Commonly known as: PEPCID Take 1 tablet (20 mg total) by mouth daily. Start taking on: October 04, 2023   hydrocortisone cream 1 % Apply topically 4 (four) times daily as needed for itching.   loratadine 10 MG tablet Commonly known as: CLARITIN Take 1 tablet (10 mg total) by mouth daily. Start taking on: October 04, 2023   multivitamin with minerals Tabs tablet Take 1 tablet by mouth daily.   predniSONE 10 MG tablet Commonly known as: DELTASONE Take 1 tablet (10 mg total) by mouth daily with breakfast. Start taking on: October 04, 2023        Day of Discharge BP 116/65 (BP Location: Right Arm)   Pulse 71   Temp 98.4 F (36.9 C) (Oral)   Resp 16   Ht 5\' 1"  (1.549 m)   Wt 62.2 kg   SpO2 100%   BMI 25.92 kg/m   Physical Exam: General: No acute respiratory distress Lungs: Clear to auscultation bilaterally without wheezes or crackles Cardiovascular: Regular rate and rhythm without murmur gallop or rub normal S1 and S2 Abdomen: Nontender, nondistended, soft, bowel sounds positive, no rebound, no ascites, no appreciable mass Extremities: No significant cyanosis, clubbing, or edema bilateral lower extremities  Basic Metabolic Panel: Recent Labs  Lab 09/27/23 0206 09/28/23 1251 09/29/23 0045 10/03/23 0424  NA 137 140 140 138  K 4.0 3.1* 3.5 3.3*  CL 105 107 107 103  CO2 25 26 24 26   GLUCOSE 109* 60* 209* 110*  BUN 11 11 9 8   CREATININE 0.66 0.72 0.76 0.71  CALCIUM 8.8* 8.5* 8.0* 8.9    CBC: Recent Labs  Lab 09/27/23 0206 09/28/23 1251 09/29/23 0045 10/03/23 0424  WBC 8.7 11.8* 6.6 9.1  NEUTROABS 5.3  --   --   --   HGB 13.8 13.9 12.9 13.9  HCT 40.2 40.9 38.3  40.3  MCV 91.2 93.0 92.7 90.6  PLT 197 250 235 225    Time spent in discharge (includes decision making & examination of pt): 35 minutes  10/03/2023, 12:09 PM   Lonia Blood, MD [facility]  [phone]

## 2023-10-03 NOTE — Progress Notes (Addendum)
RN notified by lab tech that pt c/o pain. RN went to room and pt tearful - daughter interpreting for pt and says pt upset due to call bell not working and she couldn't wake up her daughter. (Daughter sleeping in recliner right next to bed). Pt refused use of video interpreter and prefers to use daughter as interpreter.   Pt c/o feeling weak all over like she is "going in a coma"; says this happened last night too. No other neuro sx. 5/5 strength all extremities. Denies dizziness.   Also reports tight pain in mid chest 8/10 off and on and says this hasn't happened before. Pain in chest not worse with deep breath but chest is tender on palpation. Also c/o pain in posterior left shoulder worse with movement and palpation. Denies feeling anxious; appears calm, no obvious distress noted; yawning during conversation. Virgel Manifold, NP notified. EKG done and per NP try toradol and xanax.   Used  video interpreter to explain meds to pt and daughter. Pt wants to try toradol first and wait to try xanax. IV not working; attempted new IV but unable so consulted IV team. NP updated.   After IV placed, pt states pain is unchanged; toradol given, still wants to wait on xanax. At bedside shift change pt reports pain in chest is "getting better" but will not rate pain on scale.

## 2023-10-05 ENCOUNTER — Telehealth: Payer: Self-pay

## 2023-10-05 NOTE — Transitions of Care (Post Inpatient/ED Visit) (Signed)
10/05/2023  Name: Amy Flowers MRN: [id number] DOB: 12-29-1965  Today's TOC FU Call Status: Today's TOC FU Call Status:: Successful TOC FU Call Completed TOC FU Call Complete Date: 10/05/23 Patient's Name and Date of Birth confirmed.  Transition Care Management Follow-up Telephone Call Date of Discharge: 10/03/23 Discharge Facility: [facility]) Type of Discharge: Inpatient Admission Primary Inpatient Discharge Diagnosis:: anaphylaxis How have you been since you were released from the hospital?: Better Any questions or concerns?: No  Items Reviewed: Did you receive and understand the discharge instructions provided?: Yes Medications obtained,verified, and reconciled?: Partial Review Completed Reason for Partial Mediation Review: She said she has all medications and did not have any questions about the med rgime and did not need to review the med list. Any new allergies since your discharge?: No Dietary orders reviewed?: Yes Type of Diet Ordered:: heart healthy, low sodium Do you have support at home?: Yes People in Home: child(ren), dependent Name of Support/Comfort Primary Source: her daughter  Medications Reviewed Today: Medications Reviewed Today   Medications were not reviewed in this encounter     Home Care and Equipment/Supplies: Were Home Health Services Ordered?: No Any new equipment or medical supplies ordered?: No  Functional Questionnaire: Do you need assistance with bathing/showering or dressing?: No Do you need assistance with meal preparation?: No Do you need assistance with eating?: No Do you have difficulty maintaining continence: No Do you need assistance with getting out of bed/getting out of a chair/moving?: No Do you have difficulty managing or taking your medications?: No  Follow up appointments reviewed: PCP Follow-up appointment confirmed?: Yes Date of PCP follow-up appointment?: 10/14/23 Follow-up Provider: [facility]  Follow-up appointment confirmed?: No Reason Specialist Follow-Up Not Confirmed: Patient has Specialist Provider Number and will Call for Appointment (She said she has the phone number for the allergist and will call to schedule her appointment) Do you need transportation to your follow-up appointment?: No Do you understand care options if your condition(s) worsen?: Yes-patient verbalized understanding    SIGNATURE Robyne Peers, RN

## 2023-10-06 LAB — VITAMIN B1: Vitamin B1 (Thiamine): 119.7 nmol/L (ref 66.5–200.0)

## 2023-10-14 ENCOUNTER — Encounter: Payer: Self-pay | Admitting: Family Medicine

## 2023-10-14 ENCOUNTER — Other Ambulatory Visit: Payer: Self-pay

## 2023-10-14 ENCOUNTER — Ambulatory Visit: Payer: Self-pay | Attending: Family Medicine | Admitting: Family Medicine

## 2023-10-14 VITALS — BP 112/70 | HR 80 | Ht 61.0 in | Wt 142.0 lb

## 2023-10-14 DIAGNOSIS — R21 Rash and other nonspecific skin eruption: Secondary | ICD-10-CM

## 2023-10-14 DIAGNOSIS — T782XXD Anaphylactic shock, unspecified, subsequent encounter: Secondary | ICD-10-CM

## 2023-10-14 DIAGNOSIS — E876 Hypokalemia: Secondary | ICD-10-CM

## 2023-10-14 DIAGNOSIS — Z029 Encounter for administrative examinations, unspecified: Secondary | ICD-10-CM

## 2023-10-14 DIAGNOSIS — H9202 Otalgia, left ear: Secondary | ICD-10-CM

## 2023-10-14 DIAGNOSIS — R7303 Prediabetes: Secondary | ICD-10-CM

## 2023-10-14 MED ORDER — DEBROX 6.5 % OT SOLN
5.0000 [drp] | Freq: Two times a day (BID) | OTIC | 0 refills | Status: DC
  Filled 2023-10-14 – 2023-12-23 (×2): qty 15, 30d supply, fill #0

## 2023-10-14 MED ORDER — DIPHENHYDRAMINE HCL 25 MG PO CAPS
25.0000 mg | ORAL_CAPSULE | Freq: Three times a day (TID) | ORAL | 0 refills | Status: DC | PRN
  Filled 2023-10-14: qty 30, 10d supply, fill #0

## 2023-10-14 MED ORDER — EPINEPHRINE 0.3 MG/0.3ML IJ SOAJ
0.3000 mg | INTRAMUSCULAR | 1 refills | Status: DC | PRN
  Filled 2023-10-14 – 2023-11-27 (×4): qty 2, 30d supply, fill #0

## 2023-10-14 NOTE — Progress Notes (Signed)
Subjective:  Patient ID: Amy Flowers, female    DOB: 1966-10-18  Age: 57 y.o. MRN: [id number]  CC: Hospitalization Follow-up (Has paperwork to complete)   HPI Amy Flowers is a 57 y.o. year old female with a history of GERD who presents today with her daughter after hospitalization for an allergic reaction and angioedema. She presents for transition of care follow-up visit.  Transition of care call completed on 09/27/2023. She had presented with purpuric rash, tongue swelling and dyspnea and did have brief episodes of decreased responsiveness.  She was treated with epinephrine, Solu-Medrol, H1 and H2 blockers. Labs revealed negative ANA and ESR. Workup for apparent decreased responsiveness including CT head, MRI, EEG, echo were unremarkable.  Interval History: Discussed the use of AI scribe software for clinical note transcription with the patient, who gave verbal consent to proceed.   She presents with ongoing symptoms.  These have been present for 1 month prior to hospitalization.  Daughter states symptoms were not preceded by eating knots or any specific foods.  Since discharge, the patient has been experiencing periorbital swelling and skin itching. The patient's daughter reports that the patient's throat feels funny but denies presence of swelling and is somewhat vague about the symptoms.  She has not had lip edema or dyspnea. Her daughter mentions that the patient's left ear hurts, particularly at night.    Daughter states she was informed mom had vasculitis.  She has not had any episodes of decreased responsiveness. She would like paperwork completed to return to work at [employer] as she states she has since returned.  Past Medical History:  Diagnosis Date   Aortic atherosclerosis (HCC) 06/26/2017   GERD (gastroesophageal reflux disease) 02/14/2019   Hiatal hernia 02/14/2019   Vitamin D deficiency 09/28/2017    Past Surgical History:  Procedure Laterality Date   APPENDECTOMY      DIAPHRAGMATIC HERNIA REPAIR      No family history on file.  Social History   Socioeconomic History   Marital status: Single    Spouse name: Not on file   Number of children: Not on file   Years of education: Not on file   Highest education level: Not on file  Occupational History   Not on file  Tobacco Use   Smoking status: Never   Smokeless tobacco: Never  Vaping Use   Vaping status: Never Used  Substance and Sexual Activity   Alcohol use: No   Drug use: No   Sexual activity: Not on file  Other Topics Concern   Not on file  Social History Narrative   Not on file   Social Determinants of Health   Financial Resource Strain: Low Risk  (10/14/2023)   Overall Financial Resource Strain (CARDIA)    Difficulty of Paying Living Expenses: Not hard at all  Food Insecurity: No Food Insecurity (10/14/2023)   Hunger Vital Sign    Worried About Running Out of Food in the Last Year: Never true    Ran Out of Food in the Last Year: Never true  Transportation Needs: No Transportation Needs (10/14/2023)   PRAPARE - Administrator, Civil Service (Medical): No    Lack of Transportation (Non-Medical): No  Physical Activity: Insufficiently Active (10/14/2023)   Exercise Vital Sign    Days of Exercise per Week: 3 days    Minutes of Exercise per Session: 30 min  Stress: No Stress Concern Present (10/14/2023)   [employer] of Occupational Health - Occupational Stress Questionnaire  Feeling of Stress : Not at all  Social Connections: Socially Isolated (10/14/2023)   Social Connection and Isolation Panel [NHANES]    Frequency of Communication with Friends and Family: More than three times a week    Frequency of Social Gatherings with Friends and Family: More than three times a week    Attends Religious Services: Never    Database administrator or Organizations: No    Attends Banker Meetings: Never    Marital Status: Widowed    No Known  Allergies  Outpatient Medications Prior to Visit  Medication Sig Dispense Refill   acetaminophen (TYLENOL) 325 MG tablet Take 2 tablets (650 mg total) by mouth every 6 (six) hours as needed. (Patient taking differently: Take 650 mg by mouth every 6 (six) hours as needed for mild pain (pain score 1-3).) 30 tablet 0   cholecalciferol (VITAMIN D3) 25 MCG (1000 UNIT) tablet Take 1,000 Units by mouth daily.     famotidine (PEPCID) 20 MG tablet Take 1 tablet (20 mg total) by mouth daily. 30 tablet 0   hydrocortisone cream 1 % Apply topically 4 (four) times daily as needed for itching. 30 g 0   loratadine (CLARITIN) 10 MG tablet Take 1 tablet (10 mg total) by mouth daily. 30 tablet 0   Multiple Vitamin (MULTIVITAMIN WITH MINERALS) TABS tablet Take 1 tablet by mouth daily.     predniSONE (DELTASONE) 10 MG tablet Take 1 tablet (10 mg total) by mouth daily with breakfast. 7 tablet 0   EPINEPHrine 0.3 mg/0.3 mL IJ SOAJ injection Inject 0.3 mg into the muscle as needed for anaphylaxis. 2 each 0   No facility-administered medications prior to visit.     ROS Review of Systems  Constitutional:  Negative for activity change and appetite change.  HENT:  Positive for ear pain. Negative for sinus pressure and sore throat.   Respiratory:  Negative for chest tightness, shortness of breath and wheezing.   Cardiovascular:  Negative for chest pain and palpitations.  Gastrointestinal:  Negative for abdominal distention, abdominal pain and constipation.  Genitourinary: Negative.   Musculoskeletal: Negative.   Psychiatric/Behavioral:  Negative for behavioral problems and dysphoric mood.     Objective:  BP 112/70   Pulse 80   Ht 5\' 1"  (1.549 m)   Wt 142 lb (64.4 kg)   SpO2 99%   BMI 26.83 kg/m      10/14/2023    9:54 AM 10/03/2023    7:26 AM 10/03/2023    4:26 AM  BP/Weight  Systolic BP 112 116 124  Diastolic BP 70 65 71  Wt. (Lbs) 142    BMI 26.83 kg/m2        Physical Exam Constitutional:       Appearance: She is well-developed.  HENT:     Ears:     Comments: Cerumen in both ears bilaterally Eyes:     Comments: Bilateral infraorbital edema  Cardiovascular:     Rate and Rhythm: Normal rate.     Heart sounds: Normal heart sounds. No murmur heard. Pulmonary:     Effort: Pulmonary effort is normal.     Breath sounds: Normal breath sounds. No wheezing or rales.  Chest:     Chest wall: No tenderness.  Abdominal:     General: Bowel sounds are normal. There is no distension.     Palpations: Abdomen is soft. There is no mass.     Tenderness: There is no abdominal tenderness.  Musculoskeletal:  General: Normal range of motion.     Right lower leg: No edema.     Left lower leg: No edema.  Neurological:     Mental Status: She is alert and oriented to person, place, and time.  Psychiatric:        Mood and Affect: Mood normal.        Latest Ref Rng & Units 10/03/2023    4:24 AM 09/29/2023   12:45 AM 09/28/2023   12:51 PM  CMP  Glucose 70 - 99 mg/dL 841  324  60   BUN 6 - 20 mg/dL 8  9  11    Creatinine 0.44 - 1.00 mg/dL 4.01  0.27  2.53   Sodium 135 - 145 mmol/L 138  140  140   Potassium 3.5 - 5.1 mmol/L 3.3  3.5  3.1   Chloride 98 - 111 mmol/L 103  107  107   CO2 22 - 32 mmol/L 26  24  26    Calcium 8.9 - 10.3 mg/dL 8.9  8.0  8.5   Total Protein 6.5 - 8.1 g/dL 5.8     Total Bilirubin 0.3 - 1.2 mg/dL 0.6     Alkaline Phos 38 - 126 U/L 49     AST 15 - 41 U/L 16     ALT 0 - 44 U/L 22       Lipid Panel     Component Value Date/Time   CHOL 162 02/26/2022 0845   TRIG 73 02/26/2022 0845   HDL 49 02/26/2022 0845   LDLCALC 99 02/26/2022 0845    CBC    Component Value Date/Time   WBC 9.1 10/03/2023 0424   RBC 4.45 10/03/2023 0424   HGB 13.9 10/03/2023 0424   HGB 14.1 02/26/2022 0845   HCT 40.3 10/03/2023 0424   HCT 40.4 02/26/2022 0845   PLT 225 10/03/2023 0424   PLT 187 02/26/2022 0845   MCV 90.6 10/03/2023 0424   MCV 93 02/26/2022 0845   MCH 31.2  10/03/2023 0424   MCHC 34.5 10/03/2023 0424   RDW 13.1 10/03/2023 0424   RDW 11.9 02/26/2022 0845   LYMPHSABS 2.9 09/27/2023 0206   LYMPHSABS 1.8 02/26/2022 0845   MONOABS 0.3 09/27/2023 0206   EOSABS 0.1 09/27/2023 0206   EOSABS 0.2 02/26/2022 0845   BASOSABS 0.0 09/27/2023 0206   BASOSABS 0.0 02/26/2022 0845    Lab Results  Component Value Date   HGBA1C 6.0 (H) 02/26/2022    Assessment & Plan:      Allergic Reaction/angioedema Recent hospitalization for anaphylaxis with facial swelling, rash, and difficulty breathing. Currently experiencing facial swelling and itching. Unclear trigger. -Referral to Allergist for further evaluation and testing. -Prescribe EpiPen for emergency use. -Recommend over-the-counter Benadryl for symptomatic relief.  Purpuric rash She is just has residual pruritus at the moment but no obvious rash. Labs do not point toward vasculitis Will send off C4, ANCA and repeat ANA She has an upcoming appointment with dermatology in 2 months -Consider referral to appropriate specialist based on blood test results.  Ear Discomfort Reports left ear discomfort, particularly at night. -Recommend over-the-counter ear wax drops.  Hypokalemia -Noted on last set of labs -Will repeat potassium level  General Health Maintenance / Followup Plans -Complete work absence form for patient. -Schedule follow-up appointment to review blood test results.          Meds ordered this encounter  Medications   EPINEPHrine 0.3 mg/0.3 mL IJ SOAJ injection    Sig: Inject  0.3 mg into the muscle as needed for anaphylaxis.    Dispense:  2 each    Refill:  1   diphenhydrAMINE (BENADRYL ALLERGY) 25 mg capsule    Sig: Take 1 capsule (25 mg total) by mouth every 8 (eight) hours as needed. Allergic reaction    Dispense:  30 capsule    Refill:  0   carbamide peroxide (DEBROX) 6.5 % OTIC solution    Sig: Place 5 drops into the left ear 2 (two) times daily.    Dispense:  15 mL     Refill:  0    Follow-up: Return in about 3 months (around 01/14/2024) for Coordination of care.       Hoy Register, MD, FAAFP. [facility] [place], [place] [phone]   10/14/2023, 11:57 AM

## 2023-10-14 NOTE — Patient Instructions (Signed)
??????? ?????? ??? ???????? Anaphylactic Reaction, Adult ??????? ?????? (????? ?? ????? ???????? ???????) ?? ????? ?????? ????? ?????? ?????? ?????? ??? ?????. ????? ??? ?? ???? ??? ??? ??????? ?? ????????. ?? ????? ??????? ???? ??? ????? ?? ??????? ????? ????? ???? ????? ?? ?????? ????? (???? ????????). ???? ???? ???? ???? ???? ??????? ????? ??????? ???? ????????? ????? ????? ??????????. ????? ???? ???? ???? ?? ????? ?? ??????? ????? ?? ??? ??? ???? ?? ????? ??? ????? ????????. ??? ?????? ???????? ???? ???? ?? ???? ??? ????? ?? ???????:  ??? ???????? ??? ????? ????????? ?? ?????? ?? ??????? ?? ??????? ?? ?????.  ?????.  ????? ??????? ?? ???????.  ???? ?? ?????? ???? ???? ????? ?????? ?????? (?????? ??? ????).  ??? ?????? ??????????? ????? ???????? ????????? ????? ??????? (??????) ????????? ?? ??? ?????? ??????. ?? ????? ????? ?????? ????? ???????? ?????? ???? ????? ?? ??????? ?? ??????? ???????:  ?????? ???????.  ?????? ??? ??? ??????? ??????.  ???? ????? ?????? ???????? ?????? ?? ??????? ?? ???.  ??????? ?????? ?????? ????? ????? ?????????. ?? ?????? ??? ?????? ?? ???????? ?? ???? ????? ??? ??????:  ?????? ???? ?? ????? (??????). ??? ????? ????.  ???. ?????? ????? ???????????? ??? ????? ?? ????? ?????? ?????? ????? ???? ????? ???????.  ???? ??????? ?? ??????? ?? ????? ?? ???? ?? ?????? ?? ?????.  ????? ??????? ?? ?????? ???????.   ???? ????? ???? ????? ?????? ??? ??????? ??????? ?? ???? ??? ??? ?????? (????).  ?????? ??????? ?? ?????? ?? ???????.  ??? ?? ?????? ?? ?????.  ???? ?? ?????. ??? ????? ??? ??????? ????? ??? ?????? ???:  ??????? ???????.  ????? ??????.  ???? ????.  ??? ??? ????? ???? ????? ??????. ??? ?????? ??? ???????  ??? ??? ????? ??? ????? ?? ??????? ??????? ???? ???? ????? ??????? ???????:  ??? ???? ????? ??????????? ????? ???????? ?????? ??????? ??????? (??? ????? ??????). ??????? ???? ??????? ?????? ??????? ?? ????? ??????? ??? ????? ??????.  ???? ????????.  ???? ??? ????? ???????? ??? ????? ??????? ???? ???? ????? ?????? ??? ???????? ??????. ????? ?????: ? ????? ???????? ??? ?? ???: ? ????? ??????? ??????? ???? (???????????). ? ????? ????? ?????? (?????? ??????????). ? ????? ?????? (??????????????????). ? ?????? ????????? ?????? ???? ??????. ? ?????? ?? ???? ?????? ?????? ??? ???? ????? ?? ???? ?? ?????? ????????. ???? ????????? ??????? ?? ??????: ???????  ???????? ?????? ???? ????? ?????? ?? ????????. ???????? ??? ??????? ????? ??????? ?????? ??????? ??.  ?? ??? ??????? ??? ?????? ???????? ??????? ?????? ??? ????? ???? ??????? ?????? ??????? ?? ???? ?????? ???.  ???? ??? ?????? ???? ?????? ????? ???? ???? ??? ???: ? ?????? ???? ???? ?? ????????. ? ????? ??????? ??? ????? ??????.  ?????? ??? ??????????? ??????? ?? ????? ????????. ???? ????? ???? ??? ????? ??????.  ?????? ???? ?? ??? ????? ??? ???? ???? ??? ???????? ???? ???????? ??? ??? ??? ???? ??????? ?????? ???.  ????? ??? ?????? ??????? ??????.  ????? ????? ??????? ?????? ??????? ?? ???? ??? ?????? ?? ???? ???? ??????? ??????. ??????? ????  ?? ?????? ???????? ????? ???? ????? ????? ???? ?? ???? ???? ????? ??? ????? ???????? ???? ??????? ?????? ??????? ??.  ?? ???? ??????? ?????? ?? ??? ????: ? ????? ????? ???????? ???? ????? ???? ???? ???? (?????? ??????????) ??? ??????? ???? ??????? ??????. ? ?? ??? ???? ????? ???? ???? (?????? ?????) ??? ????. ????? ???? ???? ?? ??? ????????? ?? ??? ????. ?? ?????? ????? ??????.  ???? ?????? ??????? ?? ???? ????? ?? ??????. ??? ????? ??????? ?? ????  ???? ???? ???? ??????? ????????.  ????? ???? ???? ???????? ???? ?? ???? ?????? ??? ???? ?????????. ????? ??? ??? ??? ????? ?? ??? ??? ??????? ?? ??????? ???? ????? ?? ?????? ??????. ????? ?????? ??? ?????? ?? ?????????:  ?????????? ????????? ???????? ?????? ???? ??????? (  [employer]) ?(AAAAI):? [email or web address] ???? ?????? ??????? ?????? ?? ??????? ???????:  ?????? ??????  ??? ????? ?????? ??????? ??. ???? ???????? ????? ?? ??????? ???????:  ???? ????? ????? ????????.  ??? ???????? ???? ????? ??????? ???? ?????? ??? ?????? ?? ??????? ?????? ??? ???? ?? ?????? ??? ??????? ???????? ???????? ?????? ?? ????? ??? ???? ???? 72 ???? (?????? ?????). ???? ???? ??? ??????? ????? ??? ???? ???? ?????. ?????? ??? ?????? ?????? ?????? ?? ???? ?????? 911.  ??? ?? ????? ???? ?? ??? ???? ??????? ????? ?? ??.  ?? ??? ??????? ????? ??? ????????. ??? ????? ?? ??? ????????? ?? ???? ?????? ????????? ???? ?????? ???? ??????? ??????. ???? ?? ?????? ??? ????? ???? ?? ???? ?? ???? ??????? ??????.? Document Revised: 09/17/2022 Document Reviewed: 09/17/2022 Elsevier Patient Education  2024 [employer].

## 2023-10-16 ENCOUNTER — Other Ambulatory Visit: Payer: Self-pay

## 2023-10-16 LAB — POTASSIUM: Potassium: 3.6 mmol/L (ref 3.5–5.2)

## 2023-10-16 LAB — ANCA PROFILE
Anti-MPO Antibodies: <0.2 U (ref 0.0–0.9)
Anti-PR3 Antibodies: <0.2 U (ref 0.0–0.9)
Atypical pANCA: <1:20 {titer}
C-ANCA: <1:20 {titer}
P-ANCA: <1:20 {titer}

## 2023-10-16 LAB — C4 COMPLEMENT: Complement C4, Serum: 29 mg/dL (ref 12–38)

## 2023-10-20 ENCOUNTER — Ambulatory Visit: Payer: Self-pay | Admitting: Family Medicine

## 2023-10-26 ENCOUNTER — Other Ambulatory Visit: Payer: Self-pay

## 2023-10-30 ENCOUNTER — Other Ambulatory Visit: Payer: Self-pay

## 2023-11-02 ENCOUNTER — Other Ambulatory Visit: Payer: Self-pay

## 2023-11-09 ENCOUNTER — Other Ambulatory Visit: Payer: Self-pay

## 2023-11-19 ENCOUNTER — Other Ambulatory Visit: Payer: Self-pay

## 2023-11-24 ENCOUNTER — Other Ambulatory Visit: Payer: Self-pay

## 2023-11-24 ENCOUNTER — Ambulatory Visit (INDEPENDENT_AMBULATORY_CARE_PROVIDER_SITE_OTHER): Payer: Self-pay | Admitting: Internal Medicine

## 2023-11-24 VITALS — BP 116/70 | HR 88 | Temp 98.2°F | Ht 60.24 in | Wt 139.0 lb

## 2023-11-24 DIAGNOSIS — J301 Allergic rhinitis due to pollen: Secondary | ICD-10-CM

## 2023-11-24 DIAGNOSIS — H6993 Unspecified Eustachian tube disorder, bilateral: Secondary | ICD-10-CM

## 2023-11-24 DIAGNOSIS — L500 Allergic urticaria: Secondary | ICD-10-CM

## 2023-11-24 MED ORDER — CETIRIZINE HCL 10 MG PO TABS
10.0000 mg | ORAL_TABLET | Freq: Two times a day (BID) | ORAL | 5 refills | Status: DC | PRN
  Filled 2023-11-24: qty 60, 30d supply, fill #0

## 2023-11-24 MED ORDER — FAMOTIDINE 20 MG PO TABS
20.0000 mg | ORAL_TABLET | Freq: Two times a day (BID) | ORAL | 5 refills | Status: DC | PRN
  Filled 2023-11-24: qty 60, 30d supply, fill #0

## 2023-11-24 MED ORDER — FLUTICASONE PROPIONATE 50 MCG/ACT NA SUSP
2.0000 | Freq: Every day | NASAL | 5 refills | Status: DC
  Filled 2023-11-24: qty 16, 30d supply, fill #0
  Filled 2023-12-23: qty 16, 30d supply, fill #1

## 2023-11-24 NOTE — Progress Notes (Signed)
NEW PATIENT  Date of Service/Encounter:  11/24/23  Consult requested by: Hoy Register, MD   Subjective:   Amy Flowers (DOB: [birth year]) is a 57 y.o. female who presents to the clinic on 11/24/2023 with a chief complaint of Urticaria (Widespread over body x 2 month ago), Rash, Pruritus, Angioedema (Facial swelling lips tongue and eyes x 1 month ago ), and Other (C/o left ear pain with headache that radiate down left arm x 1 month) .    History obtained from: chart review and patient and daughter . Daughter translated and did not want an IT trainer.   Rhinitis:  Started many years ago.  Symptoms include: nasal congestion, rhinorrhea, and post nasal drainage  Recently noted some ear fullness/pressure and pain.   Occurs seasonally-Summer and Fall  Potential triggers: not sure  Treatments tried:  PRN anti histamines   Previous allergy testing: no History of sinus surgery: no Nonallergic triggers: none   Urticaria/Angioedema: Reports around early October, went to the ER for L sided facial pain/jaw pain and was started on a muscle relaxant and naproxen.  Denies any illness at the time, no fevers/upper or lower respiratory symptoms.  Then 1 week later, developed big, red, itchy hives all over.  Rash then changed in appearance from pink/red to violaceous/purple.  And then later, develop facial puffiness/tongue swelling.   Went to the urgent care twice on 10/16 and 10/19 and diagnosed with hives, started on benadryl and prednisone course.  Images from 10/19 show some pink urticaria but also violaceous/hyperpigmented rash.   There was also some question of nut ingestion prior to the appearance of hives but the ingestion was on 10/2 and  Then on 10/21, went to the ER and was admitted due to the violaceous rash with angioedema. Discharged 10/26.  Some concern for feeling of throat closing with tongue and some posterior pharynx swelling, no need to advanced airway though.  Given  Solumedrop, Epi, H1/H2 blockers and remained stable.  Discussed avoidance of nuts.  Continue steroids and H1/H2 blockers with follow up with Derm/Allergy.  No recurrence since late October.  No prior hx of hives/swelling.    Reviewed: Multiple ER and urgent care visits as outlined in note.   Past Medical History: Past Medical History:  Diagnosis Date   Aortic atherosclerosis (HCC) 06/26/2017   GERD (gastroesophageal reflux disease) 02/14/2019   Hiatal hernia 02/14/2019   Vitamin D deficiency 09/28/2017   Past Surgical History: Past Surgical History:  Procedure Laterality Date   APPENDECTOMY     DIAPHRAGMATIC HERNIA REPAIR      Family History: History reviewed. No pertinent family history.  Social History:  Flooring in bedroom: carpet Pets: none Tobacco use/exposure: none Job: none  Medication List:  Allergies as of 11/24/2023   No Known Allergies      Medication List        Accurate as of November 24, 2023 11:56 AM. If you have any questions, ask your nurse or doctor.          acetaminophen 325 MG tablet Commonly known as: Tylenol Take 2 tablets (650 mg total) by mouth every 6 (six) hours as needed. What changed: reasons to take this   cetirizine 10 MG tablet Commonly known as: ZyrTEC Allergy Take 1 tablet (10 mg total) by mouth 2 (two) times daily as needed (allergies or hives). Started by: Birder Robson   cholecalciferol 25 MCG (1000 UNIT) tablet Commonly known as: VITAMIN D3 Take 1,000 Units by mouth daily.  Debrox 6.5 % OTIC solution Generic drug: carbamide peroxide Place 5 drops into the left ear 2 (two) times daily.   diphenhydrAMINE 25 mg capsule Commonly known as: Benadryl Allergy Take 1 capsule (25 mg total) by mouth every 8 (eight) hours as needed. Allergic reaction   EPINEPHrine 0.3 mg/0.3 mL Soaj injection Commonly known as: EPI-PEN Inject 0.3 mg into the muscle as needed for anaphylaxis.   famotidine 20 MG tablet Commonly known  as: Pepcid Take 1 tablet (20 mg total) by mouth 2 (two) times daily as needed (hives). What changed:  when to take this reasons to take this Changed by: Ellen Henri Jakylah Bassinger   fluticasone 50 MCG/ACT nasal spray Commonly known as: FLONASE Place 2 sprays into both nostrils daily. Started by: Birder Robson   hydrocortisone cream 1 % Apply topically 4 (four) times daily as needed for itching.   loratadine 10 MG tablet Commonly known as: CLARITIN Take 1 tablet (10 mg total) by mouth daily.   multivitamin with minerals Tabs tablet Take 1 tablet by mouth daily.   predniSONE 10 MG tablet Commonly known as: DELTASONE Take 1 tablet (10 mg total) by mouth daily with breakfast.         REVIEW OF SYSTEMS: Pertinent positives and negatives discussed in HPI.   Objective:   Physical Exam: BP 116/70   Pulse 88   Temp 98.2 F (36.8 C)   Ht 5' 0.24" (1.53 m)   Wt 139 lb (63 kg)   SpO2 98%   BMI 26.93 kg/m  Body mass index is 26.93 kg/m. GEN: alert, well developed HEENT: clear conjunctiva, TM grey and translucent, nose with + mild inferior turbinate hypertrophy, pink nasal mucosa, slight clear rhinorrhea,no cobblestoning HEART: regular rate and rhythm, no murmur LUNGS: clear to auscultation bilaterally, no coughing, unlabored respiration ABDOMEN: soft, non distended  SKIN: no rashes or lesions   Skin Testing:  Skin prick testing was placed, which includes aeroallergens/foods, histamine control, and saline control.  Verbal consent was obtained prior to placing test.  Patient tolerated procedure well.  Allergy testing results were read and interpreted by myself, documented by clinical staff. Adequate positive and negative control.  Results discussed with patient/family.  Airborne Adult Perc - 11/24/23 1000     Time Antigen Placed 1052    Allergen Manufacturer Waynette Buttery    Location Back    Number of Test 55    1. Control-Buffer 50% Glycerol Negative    2. Control-Histamine 3+    3.  Bahia 3+    4. French Southern Territories 3+    5. Johnson 2+    6. Kentucky Blue 3+    7. Meadow Fescue 3+    8. Perennial Rye 3+    9. Timothy 3+    10. Ragweed Mix Negative    11. Cocklebur Negative    12. Plantain,  English Negative    13. Baccharis 3+    14. Dog Fennel Negative    15. Russian Thistle 3+    16. Lamb's Quarters 2+    17. Sheep Sorrell Negative    18. Rough Pigweed 3+    19. Marsh Elder, Rough Negative    20. Mugwort, Common 2+    21. Box, Elder 2+    22. Cedar, red Negative    23. Sweet Gum 2+    24. Pecan Pollen Negative    25. Pine Mix Negative    26. Walnut, Black Pollen 2+    27. Red Mulberry 2+  28. Ash Mix Negative    29. Birch Mix Negative    30. Beech American Negative    31. Cottonwood, Guinea-Bissau 2+    32. Hickory, White 2+    33. Maple Mix 2+    34. Oak, Guinea-Bissau Mix 2+    35. Sycamore Eastern Negative    36. Alternaria Alternata Negative    37. Cladosporium Herbarum Negative    38. Aspergillus Mix Negative    39. Penicillium Mix Negative    40. Bipolaris Sorokiniana (Helminthosporium) Negative    41. Drechslera Spicifera (Curvularia) Negative    42. Mucor Plumbeus Negative    43. Fusarium Moniliforme Negative    44. Aureobasidium Pullulans (pullulara) Negative    45. Rhizopus Oryzae Negative    46. Botrytis Cinera Negative    47. Epicoccum Nigrum Negative    48. Phoma Betae Negative    49. Dust Mite Mix Negative    50. Cat Hair 10,000 BAU/ml Negative    51.  Dog Epithelia Negative    52. Mixed Feathers Negative    53. Horse Epithelia Negative    54. Cockroach, German Negative    55. Tobacco Leaf Negative             13 Food Perc - 11/24/23 1000       Test Information   Time Antigen Placed 1052    Allergen Manufacturer Greer    Location Back    Number of allergen test 13      Food   1. Peanut Negative    2. Soybean Negative    3. Wheat Negative    4. Sesame Negative    5. Milk, Cow Negative    6. Casein Negative    7. Egg White,  Chicken Negative    8. Shellfish Mix Negative    9. Fish Mix Negative    10. Cashew Negative    11. Walnut Food Negative    12. Almond Negative    13. Hazelnut Negative               Assessment:   1. Seasonal allergic rhinitis due to pollen   2. Allergic urticaria   3. Dysfunction of both eustachian tubes     Plan/Recommendations:  Urticaria/Angioedema (Hives/Swelling), Possibly Vasculitis - If rashes/swelling recur, please take pictures/videos each day.  Description sounds like hives but pictures also concerning for vasculitis due to violaceous rash.  Agree with Dermatology referral for biopsy but this would require active lesions.   Previous labwork negative for ANCA/ANA, normal C4.   - At this time etiology of hives and swelling is unknown. Hives can be caused by a variety of different triggers including illness/infection, exercise, pressure, vibrations, extremes of temperature to name a few however majority of the time there is no identifiable trigger.  -We will obtain labs to rule out inflammatory/viral/thyroid/alpha gal/mast cell causes. -If rashes/swelling recur, start Zyrtec 10mg  twice daily and Pepcid 20mg  twice daily.  - SPT 11/2023: negative for commonly allergenic foods, including treenuts and peanuts.  Discussed reaction was not an IgE mediated food allergy. Okay to eat nuts.    Allergic Rhinitis: ET Dysfunction:  - Due to turbinate hypertrophy, seasonal symptoms and unresponsive to over the counter meds, will perform skin testing to identify aeroallergen triggers.   - Positive skin test 11/2023: trees, grasses, weeds  - Avoidance measures discussed. - Use nasal saline rinses before nose sprays such as with Neilmed Sinus Rinse.  Use distilled water.   - Use Flonase  2 sprays each nostril daily. Aim upward and outward. - Use Zyrtec 10 mg. Dosing as discussed above for hives.   ALLERGEN AVOIDANCE MEASURES  Pollen Avoidance Pollen levels are highest during the  mid-day and afternoon.  Consider this when planning outdoor activities. Avoid being outside when the grass is being mowed, or wear a mask if the pollen-allergic person must be the one to mow the grass. Keep the windows closed to keep pollen outside of the home. Use an air conditioner to filter the air. Take a shower, wash hair, and change clothing after working or playing outdoors during pollen season.      Return in about 2 months (around 01/25/2024).  Alesia Morin, MD [facility]

## 2023-11-24 NOTE — Patient Instructions (Addendum)
Urticaria/Angioedema (Hives/Swelling)  - If rashes/swelling recur, please take pictures/videos each day.   - At this time etiology of hives and swelling is unknown. Hives can be caused by a variety of different triggers including illness/infection, exercise, pressure, vibrations, extremes of temperature to name a few however majority of the time there is no identifiable trigger.  - Based on pictures, concern for vasculitis also.  Follow up with Dermatology.  -If rashes/swelling recur, start Zyrtec 10mg  twice daily and Pepcid 20mg  twice daily.   Allergic Rhinitis: - Positive skin test 11/2023: trees, grasses, weeds  - Avoidance measures discussed. - Use nasal saline rinses before nose sprays such as with Neilmed Sinus Rinse.  Use distilled water.   - Use Flonase 2 sprays each nostril daily. Aim upward and outward. - Use Zyrtec 10 mg daily as needed for runny nose, sneezing, itchy watery eyes.   ALLERGEN AVOIDANCE MEASURES  Pollen Avoidance Pollen levels are highest during the mid-day and afternoon.  Consider this when planning outdoor activities. Avoid being outside when the grass is being mowed, or wear a mask if the pollen-allergic person must be the one to mow the grass. Keep the windows closed to keep pollen outside of the home. Use an air conditioner to filter the air. Take a shower, wash hair, and change clothing after working or playing outdoors during pollen season.

## 2023-11-25 NOTE — Addendum Note (Signed)
Addended by: Floydene Flock C on: 11/25/2023 02:04 PM   Modules accepted: Orders

## 2023-11-27 ENCOUNTER — Encounter: Payer: Self-pay | Admitting: Physician Assistant

## 2023-11-27 ENCOUNTER — Other Ambulatory Visit: Payer: Self-pay

## 2023-11-27 ENCOUNTER — Ambulatory Visit: Payer: Self-pay | Attending: Physician Assistant | Admitting: Physician Assistant

## 2023-11-27 VITALS — BP 124/80 | HR 70 | Wt 142.2 lb

## 2023-11-27 DIAGNOSIS — M5431 Sciatica, right side: Secondary | ICD-10-CM

## 2023-11-27 DIAGNOSIS — H6592 Unspecified nonsuppurative otitis media, left ear: Secondary | ICD-10-CM

## 2023-11-27 DIAGNOSIS — H7292 Unspecified perforation of tympanic membrane, left ear: Secondary | ICD-10-CM

## 2023-11-27 MED ORDER — PREDNISONE 10 MG PO TABS
ORAL_TABLET | ORAL | 0 refills | Status: AC
  Filled 2023-11-27: qty 21, 6d supply, fill #0

## 2023-11-27 NOTE — Patient Instructions (Signed)
?????? ??????? Sciatica  ?????? ??????? ???? ???? ?? ????? ?? ??? ?? ??? ??? ???? ????? ??????. ????? ????? ?????? ?? ????? ?????? ?? ????? ????? ?????? ??? ?????? ?????? ?? ?? ???. ??? ????? ????? ?? ????? ????? ??????? ?????? ?????? ?? ??????. ???? ???? ?????? ??????? (???????) ?? ????? ?????? ?? ????? ?????? ??????? ????? ?????. ?????? ??????? ???? ????? ???? ???? ???? ??? ??? ?? ????? ??? ????? ??????. ??????? ?? ????? ?????? ??????? ?????? ?????? ??? ?? ?????. ???? ???? ?? ???? ?????? ??????? ?? ????? ???? ?? ???????. ??? ?? ??? ???????? ??? ????? ?????? ??????? ???????? (?????). ?? ????? ??? ??????? ???? ??? ?????? ?? ????? ??? ????? ?????? ?? ????? ??? ?????. ????? ?? ???? ??? ????? ????? ??????? ???????:  ???? ??? ?? ??? ???? ?????? ?????? ?????? ????? ?????? (????? ???????).  ???????? ???????? ?????? ?? ????? ?????? ??????.  ?????? ???? ???? ??? ???? ????? ???????.  ??? ???? ?? ?????? ??? ????? ?? ????? ??????.  ?????? (???) ?? ?????.  ?????.  ???. ???? ??? ???? ??????. ?? ????? ????? ?????? ???? ????? ??????? ??????? ?? ?????? ??????? ???? ??????:  ?????? ????? ?????? ????? ????? ?? ??????? ??? ?????? ??????.  ??? ????? ????? ????????.  ?????? ?????? ?? ?????? ?????? ?? ????? ??????.  ?????? ?????? ????? ?????.  ?????? ????? ????? ???????? ?? ??? ??????? ??? ??? ?????.  ??????. ?? ?????? ??? ?????? ?? ???????? ?? ????? ??????? ?? ????? ??? ????? ?????. ????? ?? ???? ??? ???????:  ???????? ?? ??? ?? ???????? ??????? ?? ???? ????? ?? ????? ?? ????? ?? ???????: ? ??? ???? ?? ????? ?? ???? ????. ? ??????? ???? ????. ? ???? ????.  ????? ???? ????? ?????? ?? ?????? ?? ???? ?????.  ??? ?????.  ??? ??? ?? ????? ???? ??? ????? ??????. ?? ????? ??????? ??????? ??? ?????? ?? ????? ?? ????? ?? ??? ?????? ?? ?????? ?????? ?????. ??? ????? ??? ??????? ???? ????? ??? ?????? ???:  ??????? ???? ????? ???? ??????? ?????.  ??? ????.  ???? ????.  ???????? ???????? ???: ? ??????  ???????. ? ??????? ??????? ?????????? (MRI). ? ??????? ??????? ???????? ????????? (CT). ??? ?????? ??? ??????? ?? ?????? ?? ???????? ????? ??? ?????? ?? ????? ????? ???? ????. ??? ???? ???? ?? ???? ??????:  ???? ?? ?????? ?????? ?? ??????.  ?????? ???????? ????????? ??? ?? ??? ?????? ??????? ????????.  ??? ??? ???? ?????? ????? ??? ??????? ???????.  ????? ????? ???: ? ????? ????? ??????. ? ????? ??????.  ??? ????? ?????? ????? ?????? ???????? (???????????) ??? ????? ??????.  ???????. ???? ????????? ??????? ?? ??????: ???????  ????? ??????? ??? ?? ????? ?? ???? ??????? ??????? ????? ????? ????? ???? ?? ??? ???? ????.  ???? ???? ??????? ?????? ??????? ?? ??? ??? ??? ?????? ??????? ?? ????? ??? ???? ??????? ?? ??????? ?????? ???????. ??????? ??? ?????    ?? ????? ??? ??????? ???????? ??? ????? ????. ????? ??? ?????? ??????? ???????: ? ?? ????? ?? ??? ???????. ? ?? ????? ??? ???? ??????. ? ???? ????? ???? 20 ?????? ????? 2-3 ???? ??????. ? ??? ???? ??? ????? ??? ????? ?????? ??????? ???? ????? ????? ??? ?? ????? ?????? ?? ?????. ?????? ??? ?????? ???? ??? ???????? ??? ??? ?? ?????? ?????? ?????? ?? ??????? ?? ???????.  ??? ??????? ??? ??????? ??????? ??? ??? ?????? ???? ????? ?? ???? ??????? ??????? ??? ????? ??????? ????. ?????? ???? ??????? ???? ????? ?? ????? ??????? ?????? ??? ?????? ??????? ?? ????? ???????. ? ???? ?? ????? ??? ????? ????? ???????. ? ???? ???? ??????? ??? ?????? ?? ???  20-30 ?????. ? ??? ???? ??? ????? ??? ????? ?????? ??????? ????? ???? ??????? ????? ??? ?? ????? ?????. ?????? ??? ??????? ??????? ??? ??? ?? ?????? ?????? ?????? ?? ??????? ?? ???????. ??????   ???? ?????? ?????? ??????? ??? ??????? ??????? ??????? ??????. ?????? ???? ??????? ?????? ???? ??????? ?????? ??.  ???? ??????? ???? ???? ??? ????? ???????.  ?? ????? ????? ?? ?????? ??? ???? ?????. ? ????? ???? ?????? ????? ???? ??? ??????? ??????? ??? ??????? ?? ??????? ?? ??? ????? ??????. ??? ????? ???  ??? ??? ?????? ??????. ? ???? ?????? ?????? ????? ??? ????. ???? ????? ?? ??????? ??? ????? ??? ????? ?? ????.  ???? ??????? ??????? ??????? ??????? ????? ???????? ???? ??????? ?????? ??????? ??.  ?? ???? ????? ???? ???? ?? 10 ????? (4.5 ???) ??? ?? ????? ???? ??????? ?????? ??? ?????? ??? ?????. ??? ?? ????? ??? ???? ???????? ????? ???? ??? ??????? ???????? ???? ????? ??? ??? ?????.  ??? ??? ???????? ?????? ????? ?????? ????? ???????? ????? ????: ? ??? ????????. ? ??? ????? ?????? ?? ????. ? ???? ????????. ??????? ????  ???? ??? ???? ?? ?????? ??????. ?????? ?????? ????? ?????? ?? ????? ??? ????.  ???? ????? ????? ??????. ???? ?????? ????? ???? ?????.  ???? ????? ??? ????? ????? ??????? ?? ???????. ??? ????? ????? ??? ????? ???? ??? ???? ???? ???? ??? ????? ?????. ???? ?????? ??????? ?????? ?? ??????? ???????:  ??? ??????? ????? ???????.  ??? ???? ????? ?? ??????.  ??????? ????? ????? ?? 4 ??????.  ????? ????? ???? ??? ????. ???? ????????? ????? ?? ??????? ???????:  ??? ??? ?? ?????? ?????? ?? ?????? ?? ?????? (?????).  ?? ???? ??????? ???? ??? ???: ? ??? ?? ???? ????? ?? ????? ?? ?????? ?? ??????? ????? ???????. ? ?????? ?? ???? ?? ?????. ? ????? ???????? ??? ??????. ????  ??? ????? ?? ??? ?? ????? ?? ??? ?? ??? ???? ????? ??????? ??? ???? ??? ???? ????? ???????? ???????? ????????.  ???? ??? ?????? ?? ????? ??? ????? ?????? ?? ????? ??? ?????.  ?????? ?????? ?? ?????? ?????? ????????? ???????? ???? ????? ?? ???????. ??? ????? ?? ??? ????????? ?? ???? ?????? ????????? ???? ?????? ???? ??????? ??????. ???? ?? ?????? ??? ????? ???? ?? ???? ?? ???? ??????? ??????.? Document Revised: 03/24/2022 Document Reviewed: 03/24/2022 Elsevier Patient Education  2024 [employer].

## 2023-11-27 NOTE — Progress Notes (Signed)
Patient ID: Amy Flowers, female   DOB: 1966-03-01, 57 y.o.   MRN: [id number]   Amy Flowers, is a 57 y.o. female  IRJ:[id number]  [id number]  DOB - 30-Jan-1966  Chief Complaint  Patient presents with   Ear Pain   Leg Pain       Subjective:   Amy Flowers is a 57 y.o. female here today for R sided sciatic pain for a few months.  NKI.  No back or urinary s/sx.  Tylenol or advil relieves temporarily.  No weakness or paresthesias.    Also some issues with L ear for about 1 month.  She had severe pain in her L ear for a few days then heard a pop.  Here ear has intermittently ached since then.  No fever.  Her daughter is interpreting   No problems updated.  ALLERGIES: No Known Allergies  PAST MEDICAL HISTORY: Past Medical History:  Diagnosis Date   Aortic atherosclerosis (HCC) 06/26/2017   GERD (gastroesophageal reflux disease) 02/14/2019   Hiatal hernia 02/14/2019   Vitamin D deficiency 09/28/2017    MEDICATIONS AT HOME: Prior to Admission medications   Medication Sig Start Date End Date Taking? Authorizing Provider  acetaminophen (TYLENOL) 325 MG tablet Take 2 tablets (650 mg total) by mouth every 6 (six) hours as needed. Patient taking differently: Take 650 mg by mouth every 6 (six) hours as needed for mild pain (pain score 1-3). 09/10/23  Yes Susann Givens, Carley A, NP  carbamide peroxide (DEBROX) 6.5 % OTIC solution Place 5 drops into the left ear 2 (two) times daily. 10/14/23  Yes Hoy Register, MD  cetirizine (ZYRTEC ALLERGY) 10 MG tablet Take 1 tablet (10 mg total) by mouth 2 (two) times daily as needed (allergies or hives). 11/24/23  Yes Birder Robson, MD  diphenhydrAMINE (BENADRYL ALLERGY) 25 mg capsule Take 1 capsule (25 mg total) by mouth every 8 (eight) hours as needed. Allergic reaction 10/14/23  Yes Hoy Register, MD  famotidine (PEPCID) 20 MG tablet Take 1 tablet (20 mg total) by mouth 2 (two) times daily as needed (hives). 11/24/23  Yes Birder Robson, MD  fluticasone  (FLONASE) 50 MCG/ACT nasal spray Place 2 sprays into both nostrils daily. 11/24/23  Yes Birder Robson, MD  hydrocortisone cream 1 % Apply topically 4 (four) times daily as needed for itching. 10/03/23  Yes Lonia Blood, MD  loratadine (CLARITIN) 10 MG tablet Take 1 tablet (10 mg total) by mouth daily. 10/04/23  Yes Lonia Blood, MD  predniSONE (DELTASONE) 10 MG tablet 6,5,4,3,2,1 take each days dose all at once with food 11/27/23  Yes Zavion Sleight M, PA-C  cholecalciferol (VITAMIN D3) 25 MCG (1000 UNIT) tablet Take 1,000 Units by mouth daily. Patient not taking: Reported on 11/27/2023    [provider]  EPINEPHrine 0.3 mg/0.3 mL IJ SOAJ injection Inject 0.3 mg into the muscle as needed for anaphylaxis. Patient not taking: Reported on 11/27/2023 10/14/23   Hoy Register, MD  Multiple Vitamin (MULTIVITAMIN WITH MINERALS) TABS tablet Take 1 tablet by mouth daily. Patient not taking: Reported on 11/27/2023    [provider]    ROS: Neg resp Neg cardiac Neg GI Neg GU Neg psych Neg neuro  Objective:   Vitals:   11/27/23 1103  BP: 124/80  Pulse: 70  SpO2: 98%  Weight: 142 lb 3.2 oz (64.5 kg)   Exam General appearance : Awake, alert, not in any distress. Speech Clear. Not toxic looking HEENT: Atraumatic and Normocephalic, pupils  equally reactive to light and accomodation, L TM with rupture but no exudate or erythema. R TM ENL Neck: Supple, no JVD. No cervical lymphadenopathy.  Chest: Good air entry bilaterally, CTAB.  No rales/rhonchi/wheezing CVS: S1 S2 regular, no murmurs.  Back-no lumbar TTP.  No SI joint TTP.  Pain is distribution of R sciatic nerve.  B DTR=intact.  Neg SLR.  Normal ambulation Extremities: B/L Lower Ext shows no edema, both legs are warm to touch Neurology: Awake alert, and oriented X 3, CN II-XII intact, Non focal Skin: No Rash  Data Review Lab Results  Component Value Date   HGBA1C 6.0 (H) 02/26/2022    Assessment &  Plan   1. Sciatica of right side (Primary) - predniSONE (DELTASONE) 10 MG tablet; 6,5,4,3,2,1 take each days dose all at once with food  Dispense: 21 tablet; Refill: 0  2. Otitis media, serous, tm rupture, left No evidence of infection and not draining - Ambulatory referral to ENT    Return if symptoms worsen or fail to improve.  The patient was given clear instructions to go to ER or return to medical center if symptoms don't improve, worsen or new problems develop. The patient verbalized understanding. The patient was told to call to get lab results if they haven't heard anything in the next week.      Georgian Co, PA-C [facility] and [facility] [place], [place] [phone]   11/27/2023, 11:39 AM

## 2023-12-07 LAB — VIRAL HEPATITIS HBV, HCV
HCV Ab: NONREACTIVE
Hep B Core Total Ab: NEGATIVE
Hep B Surface Ab, Qual: NONREACTIVE
Hepatitis B Surface Ag: NEGATIVE

## 2023-12-07 LAB — IFE, PE AND FLC, SERUM
Albumin SerPl Elph-Mcnc: 3.8 g/dL (ref 2.9–4.4)
Albumin/Glob SerPl: 1.2 (ref 0.7–1.7)
Alpha 1: 0.3 g/dL (ref 0.0–0.4)
Alpha2 Glob SerPl Elph-Mcnc: 0.8 g/dL (ref 0.4–1.0)
B-Globulin SerPl Elph-Mcnc: 1 g/dL (ref 0.7–1.3)
Gamma Glob SerPl Elph-Mcnc: 1.3 g/dL (ref 0.4–1.8)
Globulin, Total: 3.3 g/dL (ref 2.2–3.9)
Ig Kappa Free Light Chain: 29.5 mg/L — ABNORMAL HIGH (ref 3.3–19.4)
Ig Lambda Free Light Chain: 22.7 mg/L (ref 5.7–26.3)
IgA/Immunoglobulin A, Serum: 191 mg/dL (ref 87–352)
IgG (Immunoglobin G), Serum: 1010 mg/dL (ref 586–1602)
IgM (Immunoglobulin M), Srm: 551 mg/dL — ABNORMAL HIGH (ref 26–217)
KAPPA/LAMBDA RATIO: 1.3 (ref 0.26–1.65)
Total Protein: 7.1 g/dL (ref 6.0–8.5)

## 2023-12-07 LAB — ALPHA-GAL PANEL
Allergen Lamb IgE: <0.1 kU/L
Beef IgE: <0.1 kU/L
IgE (Immunoglobulin E), Serum: 672 [IU]/mL — ABNORMAL HIGH (ref 6–495)
O215-IgE Alpha-Gal: <0.1 kU/L
Pork IgE: <0.1 kU/L

## 2023-12-07 LAB — CHRONIC URTICARIA: cu index: 2.8 (ref <10)

## 2023-12-07 LAB — TRYPTASE: Tryptase: 7.9 ug/L (ref 2.2–13.2)

## 2023-12-07 LAB — C3 AND C4
Complement C3, Serum: 134 mg/dL (ref 82–167)
Complement C4, Serum: 28 mg/dL (ref 12–38)

## 2023-12-07 LAB — TSH+FREE T4
Free T4: 1.01 ng/dL (ref 0.82–1.77)
TSH: 1.54 u[IU]/mL (ref 0.450–4.500)

## 2023-12-07 LAB — HCV INTERPRETATION

## 2023-12-15 ENCOUNTER — Other Ambulatory Visit: Payer: Self-pay | Admitting: Internal Medicine

## 2023-12-15 DIAGNOSIS — L508 Other urticaria: Secondary | ICD-10-CM

## 2023-12-15 DIAGNOSIS — D890 Polyclonal hypergammaglobulinemia: Secondary | ICD-10-CM

## 2023-12-15 NOTE — Progress Notes (Unsigned)
Referral to Rheumatology placed

## 2023-12-23 ENCOUNTER — Encounter: Payer: Self-pay | Admitting: Dermatology

## 2023-12-23 ENCOUNTER — Ambulatory Visit (INDEPENDENT_AMBULATORY_CARE_PROVIDER_SITE_OTHER): Payer: Self-pay | Admitting: Dermatology

## 2023-12-23 ENCOUNTER — Other Ambulatory Visit: Payer: Self-pay

## 2023-12-23 VITALS — BP 96/61 | HR 75

## 2023-12-23 DIAGNOSIS — L958 Other vasculitis limited to the skin: Secondary | ICD-10-CM

## 2023-12-23 DIAGNOSIS — L918 Other hypertrophic disorders of the skin: Secondary | ICD-10-CM

## 2023-12-23 NOTE — Progress Notes (Signed)
   New Patient Visit   Subjective  Amy Flowers is a 58 y.o. female accompanied by daughter and grand children (Daughter Amy Flowers) who presents for the following: Vasculitis    Patient states she has skin irritation that originally presented scattered throughout her body but today just on her legs that she would like to have examined. Daughter provided pictures of previous effected areas. Patient reports the areas have been there for 3 months. She reports the areas are bothersome. She reports the areas are very itchy. Patient rates irritation 10 out of 10. She states that the areas have spread. Patient reports she has previously been treated for these areas(In ER). Patient denies Hx of bx. Patient denies family history of anything like this.  The following portions of the chart were reviewed this encounter and updated as appropriate: medications, allergies, medical history  Review of Systems:  No other skin or systemic complaints except as noted in HPI or Assessment and Plan.  Objective  Well appearing patient in no apparent distress; mood and affect are within normal limits.  A focused examination was performed of the following areas: B/L LE  Relevant exam findings are noted in the Assessment and Plan.    Assessment & Plan   Urticarial Vasculitis Assessment: Patient developed a severe, itchy, elevated rash with dark discoloration after taking Flexeril  (cyclobenzaprine ) and naproxen  on October 3rd. 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 Deep purple areas identified as vasculitis, with other reactions being hives. C-ANCA and P-ANCA tests were negative. Rash resolved with treatment.  Plan: Add cyclobenzaprine  (Flexeril ) to the patient's allergy  list. Avoid future use of Flexeril . Continue to monitor for any recurrence of rash. If rash returns, advise patient to contact the clinic immediately via MyChart or in-person visit. Provided sample bag of moisturizers for skin  hydration.  Skin Care Management Assessment: Patient's skin is currently in good condition. Recent episode of urticarial vasculitis and the current winter season necessitate proper skin hydration and care.  Plan: Apply moisturizer immediately after showering. Use provided sample moisturizers. For bruising, recommend over-the-counter DermMend cream with Arnica. Continue to monitor skin condition. Patient educated on the importance of skin hydration, especially during winter.  INFLAMED SKIN TAG    No follow-ups on file.    Documentation: I have reviewed the above documentation for accuracy and completeness, and I agree with the above.  I, Jetta Ager, am acting as scribe for Amy Roup, DO.  Amy Roup, DO

## 2023-12-23 NOTE — Patient Instructions (Addendum)
 Hello Amy Flowers,  Thank you for visiting us  today. Your dedication to managing your skin condition and improving your overall health is greatly appreciated. Below is a summary of the essential instructions and recommendations from our consultation:  Allergies: It's important to list cyclobenzaprine  (Flexeril ) as an allergy  due to your severe recent reaction.  The rash that promted today's visit has resolved.  After reading through all the hospital notes and your recent allergy  visit, the culptrit for the Urticarial Vasculitis rash you had in October was most likely a medication allergy .  Since the rash appeared 2 weeks after starting Flexeril  for muscle spasms this is most likely the cause. Weadded this to your allergy  profile and please avoid taking this in the future.  Nut allergy  was ruled out by the allergist at your recent appointment.     Bruising Treatment: For any bruising (which can easlily occur when you take NSAIDs for pain relief for your hip), consider DermMend cream with Arnica, which you can find over-the-counter.  Lifestyle Advice:   Skin Hydration: Ensure your skin stays hydrated, especially during the winter months. Apply moisturizer right after showering to lock in moisture.   Medication Avoidance: Augustine Blocker clear of Flexeril , naproxen , Aleve , or Advil  as they can thin your blood and may worsen bruising.  Monitoring and Follow-Up:   Rash Monitoring: If the rash makes a comeback, please send a message through MyChart for a swift response, or do not hesitate to come in immediately.   Biopsy Consideration: Should the rash reappear, we will perform a biopsy to further investigate.  We've included a sample bag of moisturizers to assist in keeping your skin well-hydrated. Should you have any questions or further concerns, please don't hesitate to reach out to our office.  Warm regards,  Dr. Louana Roup,  Dermatology     Important Information   Due to recent changes in healthcare  laws, you may see results of your pathology and/or laboratory studies on MyChart before the doctors have had a chance to review them. We understand that in some cases there may be results that are confusing or concerning to you. Please understand that not all results are received at the same time and often the doctors may need to interpret multiple results in order to provide you with the best plan of care or course of treatment. Therefore, we ask that you please give us  2 business days to thoroughly review all your results before contacting the office for clarification. Should we see a critical lab result, you will be contacted sooner.     If You Need Anything After Your Visit   If you have any questions or concerns for your doctor, please call our main line at [phone]. If no one answers, please leave a voicemail as directed and we will return your call as soon as possible. Messages left after 4 pm will be answered the following business day.    You may also send us  a message via MyChart. We typically respond to MyChart messages within 1-2 business days.  For prescription refills, please ask your pharmacy to contact our office. Our fax number is [phone].  If you have an urgent issue when the clinic is closed that cannot wait until the next business day, you can page your doctor at the number below.     Please note that while we do our best to be available for urgent issues outside of office hours, we are not available 24/7.    If you have an  urgent issue and are unable to reach us , you may choose to seek medical care at your doctor's office, retail clinic, urgent care center, or emergency room.   If you have a medical emergency, please immediately call 911 or go to the emergency department. In the event of inclement weather, please call our main line at [phone] for an update on the status of any delays or closures.  Dermatology Medication Tips: Please keep the boxes that topical  medications come in in order to help keep track of the instructions about where and how to use these. Pharmacies typically print the medication instructions only on the boxes and not directly on the medication tubes.   If your medication is too expensive, please contact our office at [phone] or send us  a message through MyChart.    We are unable to tell what your co-pay for medications will be in advance as this is different depending on your insurance coverage. However, we may be able to find a substitute medication at lower cost or fill out paperwork to get insurance to cover a needed medication.    If a prior authorization is required to get your medication covered by your insurance company, please allow us  1-2 business days to complete this process.   Drug prices often vary depending on where the prescription is filled and some pharmacies may offer cheaper prices.   The website www.goodrx.com contains coupons for medications through different pharmacies. The prices here do not account for what the cost may be with help from insurance (it may be cheaper with your insurance), but the website can give you the price if you did not use any insurance.  - You can print the associated coupon and take it with your prescription to the pharmacy.  - You may also stop by our office during regular business hours and pick up a GoodRx coupon card.  - If you need your prescription sent electronically to a different pharmacy, notify our office through [facility] or by phone at [phone]

## 2023-12-29 ENCOUNTER — Ambulatory Visit: Payer: Self-pay | Admitting: Family Medicine

## 2024-01-01 ENCOUNTER — Other Ambulatory Visit: Payer: Self-pay

## 2024-01-11 ENCOUNTER — Telehealth (INDEPENDENT_AMBULATORY_CARE_PROVIDER_SITE_OTHER): Payer: Self-pay | Admitting: Otolaryngology

## 2024-01-11 NOTE — Telephone Encounter (Signed)
Reminder Call: Date: 01/12/2024 Status: Sch  Time: 10:30 AM [address] [place], [place] [address]  Confirmed time and location w/patient.

## 2024-01-12 ENCOUNTER — Ambulatory Visit (INDEPENDENT_AMBULATORY_CARE_PROVIDER_SITE_OTHER): Payer: Self-pay | Admitting: Audiology

## 2024-01-12 ENCOUNTER — Ambulatory Visit (INDEPENDENT_AMBULATORY_CARE_PROVIDER_SITE_OTHER): Payer: Self-pay | Admitting: Otolaryngology

## 2024-01-12 VITALS — BP 108/73 | HR 85 | Ht 61.5 in | Wt 144.8 lb

## 2024-01-12 DIAGNOSIS — H9312 Tinnitus, left ear: Secondary | ICD-10-CM

## 2024-01-12 DIAGNOSIS — H9202 Otalgia, left ear: Secondary | ICD-10-CM

## 2024-01-12 DIAGNOSIS — R6884 Jaw pain: Secondary | ICD-10-CM

## 2024-01-12 DIAGNOSIS — H903 Sensorineural hearing loss, bilateral: Secondary | ICD-10-CM

## 2024-01-12 DIAGNOSIS — M26622 Arthralgia of left temporomandibular joint: Secondary | ICD-10-CM

## 2024-01-12 NOTE — Patient Instructions (Signed)
 I have ordered an imaging study for you to complete prior to your next visit. Please call [facility] Scheduling at [phone] to schedule your imaging if you have not received a call within 24 hours. If you are unable to complete your imaging study prior to your next scheduled visit please call our office to let us  know.

## 2024-01-12 NOTE — Progress Notes (Signed)
 Dear Dr. Danton, Here is my assessment for our mutual patient, Amy Flowers. Thank you for allowing me the opportunity to care for your patient. Please do not hesitate to contact me should you have any other questions. Sincerely, Dr. Eldora Blanch  Otolaryngology Clinic Note Referring provider: Dr. Danton HPI:  Amy Flowers is a 58 y.o. female kindly referred by Dr. Danton for evaluation of left ear pain.  Offered interpreter, declined.   Initial visit (01/12/2024): Patient reports: left ear pain ongoing since October, pain radiates to the jaw. No antecedent event, had fair amount of jaw pain and could not open her mouth. Jaw/preauricular pain is still ongoing. She has tried flonase , zyrtec , and NSAIDs without help. Has been seen multiple times in the ED for this, but nothing has really helped. She was noted to have an ear effusion and was treated with flonase /zyrtec  but without improvement (only tried for 5 days). No dental pain, vision changes, arthralgias, bruxism, neck pain. Patient denies: fullness, vertigo, drainage, tinnitus (rare), hearing loss Patient additionally denies: deep pain in ear canal, eustachian tube symptoms such as popping, crackling, sensitive to pressure changes Patient also denies barotrauma, vestibular suppressant use, ototoxic medication use Prior ear surgery: no No ear infections. Ears are generally not a problem for her.  Referral to rheum for rash and possible concerns for vasculitis  H&N Surgery: denies Personal or FHx of bleeding dz or anesthesia difficulty: no   GLP-1: no AP/AC: o  Tobacco: no  PMHx: GERD, Allergic Reaction  Independent Review of Additional Tests or Records:  ED Visit Dr. Dasie (09/10/2023): jaw pain started in Oct, difficulty eating and talking; some calf cramping; noted b/l ME effusion; Dx: OM; Rx: Flonsae, zyrtec  Multiple rheum labs 11/24/2023 an d10/2024: CK 27, CRP 1, Sed wnl, ANA neg Admission to [facility] 09/28/2023 to  10/03/2023 (Dr. Danton): noted allergic type reaction after eating mixed nuts and then urticaria, tongue swelling, SOB; given steroids, Angioedema cocktail; also noted purpuric rash and workup done, resolved at time of D/c; Dx: Angioedema and rash; Rx: Ref Derm and Rhem Dr. SHAUNNA Blanch (Allergy ) 11/24/2023: noted rash prior and swelling; Dx: Allergic rhinitis, urticaria and ETD - possible vasculitis; labs done, for ETD rec Flonsae, rinses, zyrtec  Skin Allergy  testing independently reviewed and interpreted (11/24/2023): multiple positive to grasses, trees, nuts negative) Jon Danton (FM) - 12/202/2024: L ear pain x1 month, then heard a pop, some aching; Dx: OM with TM rupture; Rx: Ref ENT, no evidnence of infxn, Pred taper for sciatica 01/2024 Audiogram was independently reviewed and interpreted by me and it reveals  A/As tymps, essentially borderline/mild SNHL with <10 dB ABG at most frequencies; SRT 20 dB; WRT CNT due to language   SNHL= Sensorineural hearing loss  CT Face 09/10/2023 reviewed with attention to ears: b/l TMJ degenerative changes; no effusion, sclerotic mastoids b/l; cuts thick but no obvious otic capsule or ossicular chain abnormalities MRI Brain 09/30/2023 independently reviewed and interpreted: no retrocochlear lesions noted; no mastoid or ME effusion; no significant sinus disease; NP symmetric  PMH/Meds/All/SocHx/FamHx/ROS:   Past Medical History:  Diagnosis Date   Aortic atherosclerosis (HCC) 06/26/2017   GERD (gastroesophageal reflux disease) 02/14/2019   Hiatal hernia 02/14/2019   Vitamin D  deficiency 09/28/2017     Past Surgical History:  Procedure Laterality Date   APPENDECTOMY     DIAPHRAGMATIC HERNIA REPAIR      No family history on file.   Social Connections: Socially Isolated (11/24/2023)   Social Connection and Isolation Panel [NHANES]  Frequency of Communication with Friends and Family: Twice a week    Frequency of Social Gatherings with Friends and  Family: Twice a week    Attends Religious Services: Never    Database Administrator or Organizations: No    Attends Banker Meetings: Never    Marital Status: Widowed      Current Outpatient Medications:    acetaminophen  (TYLENOL ) 325 MG tablet, Take 2 tablets (650 mg total) by mouth every 6 (six) hours as needed. (Patient taking differently: Take 650 mg by mouth every 6 (six) hours as needed for mild pain (pain score 1-3).), Disp: 30 tablet, Rfl: 0   carbamide peroxide (DEBROX) 6.5 % OTIC solution, Place 5 drops into the left ear 2 (two) times daily., Disp: 15 mL, Rfl: 0   cetirizine  (ZYRTEC  ALLERGY ) 10 MG tablet, Take 1 tablet (10 mg total) by mouth 2 (two) times daily as needed (allergies or hives)., Disp: 60 tablet, Rfl: 5   cholecalciferol (VITAMIN D3) 25 MCG (1000 UNIT) tablet, Take 1,000 Units by mouth daily., Disp: , Rfl:    diphenhydrAMINE  (BENADRYL  ALLERGY ) 25 mg capsule, Take 1 capsule (25 mg total) by mouth every 8 (eight) hours as needed. Allergic reaction, Disp: 30 capsule, Rfl: 0   famotidine  (PEPCID ) 20 MG tablet, Take 1 tablet (20 mg total) by mouth 2 (two) times daily as needed (hives)., Disp: 60 tablet, Rfl: 5   fluticasone  (FLONASE ) 50 MCG/ACT nasal spray, Place 2 sprays into both nostrils daily., Disp: 16 g, Rfl: 5   hydrocortisone  cream 1 %, Apply topically 4 (four) times daily as needed for itching., Disp: 30 g, Rfl: 0   loratadine  (CLARITIN ) 10 MG tablet, Take 1 tablet (10 mg total) by mouth daily., Disp: 30 tablet, Rfl: 0   Multiple Vitamin (MULTIVITAMIN WITH MINERALS) TABS tablet, Take 1 tablet by mouth daily., Disp: , Rfl:    EPINEPHrine  0.3 mg/0.3 mL IJ SOAJ injection, Inject 0.3 mg into the muscle as needed for anaphylaxis. (Patient not taking: Reported on 01/12/2024), Disp: 2 each, Rfl: 1   Physical Exam:   BP 108/73 (BP Location: Left Arm, Patient Position: Sitting, Cuff Size: Normal)   Pulse 85   Ht 5' 1.5 (1.562 m)   Wt 144 lb 12.8 oz (65.7 kg)    SpO2 96%   BMI 26.92 kg/m   Salient findings:  CN II-XII intact Given history and complaints, ear microscopy was indicated and performed for evaluation with findings as below in physical exam section and in procedures Bilateral EAC clear; B/l myringosclerosis, monomeric areas inferiorly and anteriorly with mild retraction; no effusion noted. Bilateral clear TMJ crepitus, poor dentition Weber 512: mid Rinne 512: AC > BC b/l  Rine 1024: AC > BC b/l  Anterior rhinoscopy: Septum intact; bilateral inferior turbinates without significant hypertroph No lesions of oral cavity/oropharynx No obviously palpable neck masses/lymphadenopathy/thyromegaly No respiratory distress or stridor  Seprately Identifiable Procedures:  Procedure: Bilateral ear microscopy using microscope (CPT 92504) Pre-procedure diagnosis: otalgia, concern for ear cause Post-procedure diagnosis: same Indication: see above; given patient's otologic complaints and history, for improved and comprehensive examination of external ear and tympanic membrane, bilateral otologic examination using microscope was performed  Procedure: Patient was placed semi-recumbent. Both ear canals were examined using the microscope with findings above. Some cerumen removed on left using suction. Patient tolerated procedure well.   Impression & Plans:  Amy Flowers is a 58 y.o. female with:  1. Otalgia of left ear   2. Tinnitus of  left ear   3. Arthralgia of left temporomandibular joint   4. Jaw pain    Has been having months-long discomfort of left ear (mostly preauricular/jaw area) without other findings. Exam today reassuring and although has a As tymp, audio is reassuring. She does have clear b/l TMJ crepitus and poor dentition, and given where her pain is, I suspect referred pain is most likely cause. She otherwise does not have H&N cancer RFX but we will get CT Neck to rule out occult lesions. Prior CT Face was reassuring from ear standpoint.   DDX is wide here and given her recent issues, I do think rheum referral would be good to rule out something like vasculitis though low suspicion  - Warm compresses - Soft diet x4 weeks - NSAIDs PRN - Will get CT Neck - Continue flonase  and autoinsufflate ears - Agree with Rheum referral - f/u in 2 months  See below regarding exact medications prescribed this encounter including dosages and route: No orders of the defined types were placed in this encounter.   Thank you for allowing me the opportunity to care for your patient. Please do not hesitate to contact me should you have any other questions.  Sincerely, Eldora Blanch, MD Otolaryngologist (ENT), [facility] Phone: [phone] Fax: [phone]  01/13/2024, 9:58 AM   MDM:  Level 4 - [phone] Complexity/Problems addressed: multiple chronic problems Data complexity: mod - independent review and interpretation of imaging; review of notes, labs; ordering test - Morbidity: low/unclear  - Prescription Drug prescribed or managed: no

## 2024-01-12 NOTE — Progress Notes (Signed)
  [address], [address] [place], [place] [address] [phone]  Audiological Evaluation    Name: Amy Flowers     DOB:   1966-12-06      MRN:   [id number]                                                                                     Service Date: 01/12/2024     Accompanied by: daughter and grandsons   Patient comes today after Dr. Tobie, ENT sent a referral for a hearing evaluation due to concerns with ear pain. The daughter helped with the history intake and test instructions per patient's preference.   Symptoms Yes Details  Hearing loss  []  Not perceived  Tinnitus  [x]  Sometimes in the left ear  Ear pain/ Ear infections  [x]  Left ear pain  Balance problems  []    Noise exposure  []    Previous ear surgeries  []    Family history  []    Amplification  []    Other  []      Otoscopy: Right ear: Clear external ear canals in both ears.  Tympanometry: Right ear: Type A- Normal external ear canal volume with normal middle ear pressure and tympanic membrane compliance Left ear: Type As- Normal external ear canal volume with normal middle ear pressure and low tympanic membrane compliance  Pure tone Audiometry:  Normal hearing from [phone], then mild presumably sensorineural hearing loss at 8000 Hz.    Speech Audiometry: Right ear- Speech Reception Threshold (SRT) was obtained at 20 dBHL Left ear-Speech Reception Threshold (SRT) was obtained at 20 dBHL   Word Recognition Score Could not test due to language barrier. Patient's first language is Arabic.   The hearing test results were completed under headphones and re-checked with inserts and results are deemed to be of fair to good reliability. Test technique:  conventional      Recommendations: Follow up with ENT as per MD. Return for a hearing evaluation if concerns with hearing changes arise or per MD recommendation.   Tammera Engert MARIE LEROUX-MARTINEZ, AUD

## 2024-01-13 ENCOUNTER — Encounter (INDEPENDENT_AMBULATORY_CARE_PROVIDER_SITE_OTHER): Payer: Self-pay

## 2024-01-14 ENCOUNTER — Ambulatory Visit: Payer: Medicaid Other | Admitting: Family Medicine

## 2024-01-27 ENCOUNTER — Ambulatory Visit: Payer: Medicaid Other | Admitting: Internal Medicine

## 2024-03-14 ENCOUNTER — Telehealth (INDEPENDENT_AMBULATORY_CARE_PROVIDER_SITE_OTHER): Payer: Self-pay | Admitting: Otolaryngology

## 2024-03-14 ENCOUNTER — Encounter (INDEPENDENT_AMBULATORY_CARE_PROVIDER_SITE_OTHER): Payer: Self-pay

## 2024-03-14 NOTE — Telephone Encounter (Signed)
 Patient has text No to both appointment reminders (04/01 and 04/07). Call the patient to confirm cancellation, voicemail is full. Left a Mychart message to confirm, stated in note if they are not cancelling, they still need to call to reschedule appointment due to not having a CT scan done before appointment. Waiting on return call.

## 2024-03-15 ENCOUNTER — Ambulatory Visit (INDEPENDENT_AMBULATORY_CARE_PROVIDER_SITE_OTHER): Payer: Medicaid Other

## 2024-05-30 ENCOUNTER — Ambulatory Visit: Payer: Medicaid Other | Attending: Internal Medicine | Admitting: Internal Medicine

## 2024-05-30 ENCOUNTER — Encounter: Payer: Self-pay | Admitting: Internal Medicine

## 2024-05-30 VITALS — BP 115/72 | HR 79 | Resp 14 | Ht 60.75 in | Wt 144.0 lb

## 2024-05-30 DIAGNOSIS — D890 Polyclonal hypergammaglobulinemia: Secondary | ICD-10-CM

## 2024-05-30 DIAGNOSIS — I776 Arteritis, unspecified: Secondary | ICD-10-CM

## 2024-05-30 DIAGNOSIS — M170 Bilateral primary osteoarthritis of knee: Secondary | ICD-10-CM

## 2024-05-30 NOTE — Progress Notes (Signed)
 Office Visit Note  Patient: Amy Flowers             Date of Birth: May 09, 1966           MRN: [id number]             PCP: Delbert Clam, MD Referring: Tobie Arleta SQUIBB, MD Visit Date: 05/30/2024   Subjective:  New Patient (Initial Visit) (Patient states she has pain all down her right leg. Patient's daughter interpreted for the patient. )    Discussed the use of AI scribe software for clinical note transcription with the patient, who gave verbal consent to proceed.  History of Present Illness   Amy Flowers is a 58 year old female referred by an allergist Dr. 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 This started after hospitalization with angioedema and purpuric rashes starting in October last year. She has not experienced recurrence of symptoms. She saw D.r Alm for evaluation but with no active skin lesions at the time there were no new findings. At present she feels well with her biggest complaint being right leg pain.  She experiences hip and leg pain, particularly severe at night, described as feeling like it is 'inside on her bone.' The pain has been ongoing since last year. She has been using Tylenol  for relief. Previously, she was prescribed Flexeril  and naproxen , which she is no longer taking. Flexeril  was suspected a possible trigger for vasculitis, but tolerating NSAIDs PRN without issue lately. No recent injuries to her leg and no imaging studies like x-rays have been done for this issue. The leg pain does not worsen with prolonged walking but is more pronounced at night.  She works at [employer], which involves standing and walking regularly. She has not engaged in any unusual activities that might contribute to her leg pain. No swelling or rashes in the past five months, no swallowing problems, and no significant back pain.     Patient's daughter was present today providing additional collateral history and helping with  Arabic language interpretation at her preference.  Labs reviewed 11/2023 ANCA neg CU index neg C3/C4 wnl HBV/HCV neg IgM 551 (26-217)  09/2023 ANA neg ESR 18 CRP 1  Activities of Daily Living:  Patient reports morning stiffness for 0 minute.   Patient Reports nocturnal pain.  Difficulty dressing/grooming: Denies Difficulty climbing stairs: Denies Difficulty getting out of chair: Denies Difficulty using hands for taps, buttons, cutlery, and/or writing: Denies  Review of Systems  Constitutional:  Negative for fatigue.  HENT:  Negative for mouth sores and mouth dryness.   Eyes:  Negative for dryness.  Respiratory:  Negative for shortness of breath.   Cardiovascular:  Negative for chest pain and palpitations.  Gastrointestinal:  Negative for blood in stool, constipation and diarrhea.  Endocrine: Negative for increased urination.  Genitourinary:  Negative for involuntary urination.  Musculoskeletal:  Negative for joint pain, gait problem, joint pain, joint swelling, myalgias, muscle weakness, morning stiffness, muscle tenderness and myalgias.  Skin:  Negative for color change, rash, hair loss and sensitivity to sunlight.  Allergic/Immunologic: Negative for susceptible to infections.  Neurological:  Negative for dizziness and headaches.  Hematological:  Negative for swollen glands.  Psychiatric/Behavioral:  Negative for depressed mood and sleep disturbance. The patient is not nervous/anxious.     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 No pertinent family history. Past Surgical History:  Procedure Laterality Date   APPENDECTOMY     DIAPHRAGMATIC HERNIA REPAIR     Social History   Social History Narrative   Not on file   Immunization History  Administered Date(s) Administered   Influenza,inj,Quad PF,6+ Mos 09/28/2017   Tdap 09/28/2017     Objective: Vital Signs: BP 115/72 (BP Location: Right Arm, Patient Position: Sitting, Cuff Size: Normal)   Pulse 79   Resp 14   Ht 5' 0.75 (1.543 m)   Wt 144 lb (65.3 kg)   BMI 27.43 kg/m    Physical Exam HENT:     Nose: Nose normal.     Mouth/Throat:     Mouth: Mucous membranes are moist.     Pharynx: Oropharynx is clear.   Eyes:     Conjunctiva/sclera: Conjunctivae normal.    Cardiovascular:     Rate and Rhythm: Normal rate and regular rhythm.  Pulmonary:     Effort: Pulmonary effort is normal.     Breath sounds: Normal breath sounds.   Musculoskeletal:     Right lower leg: No edema.     Left lower leg: No edema.  Lymphadenopathy:     Cervical: No cervical adenopathy.   Skin:    General: Skin is warm and dry.     Findings: No rash.     Comments: Normal appearing naildfold capillaries No digital pitting   Neurological:     Mental Status: She is alert.   Psychiatric:        Mood and Affect: Mood normal.      Musculoskeletal Exam:  Shoulders full ROM no tenderness or swelling Elbows full ROM no tenderness or swelling Wrists full ROM no tenderness or swelling Fingers full ROM no tenderness or swelling No paraspinal tenderness to palpation over upper and lower back Hip normal internal and external rotation without pain, no tenderness to lateral hip palpation Knees full ROM no tenderness or  swelling, b/l patellofemoral crepitus Ankles full ROM no tenderness or swelling   Investigation: No additional findings.  Imaging: No results found.  Recent Labs: Lab Results  Component Value Date   WBC 9.1 10/03/2023   HGB 13.9 10/03/2023   PLT 225 10/03/2023   NA 138 10/03/2023   K 3.6 10/14/2023   CL 103 10/03/2023   CO2 26 10/03/2023   GLUCOSE 110 (H) 10/03/2023   BUN 8 10/03/2023   CREATININE 0.71 10/03/2023   BILITOT 0.6 10/03/2023   ALKPHOS 49 10/03/2023   AST 16 10/03/2023   ALT 22 10/03/2023   PROT 7.1 11/24/2023   ALBUMIN 2.8 (L) 10/03/2023   CALCIUM 8.9 10/03/2023   GFRAA 100 02/09/2019    Speciality Comments: No specialty comments available.  Procedures:  No procedures performed Allergies: Cyclobenzaprine    Assessment / Plan:     Visit Diagnoses: Primary osteoarthritis of both knees Chronic knee osteoarthritis with crepitus and cartilage thinning. Symptoms include  clicking and nocturnal pain.  The leg pain does not appear typical for sciatica based on description or with provocative times and alleviating symptoms.  Does not have considerable low back pain.  Hip range of motion is excellent though suggesting against any significant osteoarthritis. - Recommend NSAIDs PRN for pain management. - Provided handout on additional OTC supplements for osteoarthritis - Advise strengthening exercises for upper leg muscles.   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  She has not experienced any repeat episodes.  Suspicion was for Flexeril  drug reaction and follow-up dermatology clinic but had no disease activity at that time.  I am not sure about the clinical significance of an isolated high IgM with no monoclonal band and no other systemic inflammatory markers and no clinical findings on exam today.  I did not recommend any repeat a fairly extensive  testing can does monitor for now.  If symptoms do become recurrent either provoked or unprovoked we could try to take another look at that time otherwise no follow-up needed.    Orders: No orders of the defined types were placed in this encounter.  No orders of the defined types were placed in this encounter.    Follow-Up Instructions: No follow-ups on file.   Lonni LELON Ester, MD  Note - This record has been created using [employer].  Chart creation errors have been sought, but may not always  have been located. Such creation errors do not reflect on  the standard of medical care.

## 2024-07-13 ENCOUNTER — Ambulatory Visit (HOSPITAL_COMMUNITY)
Admission: EM | Admit: 2024-07-13 | Discharge: 2024-07-13 | Disposition: A | Discharge status: Home / Self Care | Attending: Internal Medicine | Admitting: Internal Medicine

## 2024-07-13 ENCOUNTER — Other Ambulatory Visit: Payer: Self-pay

## 2024-07-13 ENCOUNTER — Encounter (HOSPITAL_COMMUNITY): Payer: Self-pay

## 2024-07-13 DIAGNOSIS — J069 Acute upper respiratory infection, unspecified: Secondary | ICD-10-CM

## 2024-07-13 LAB — POC SARS CORONAVIRUS 2 AG -  ED: SARS Coronavirus 2 Ag: NEGATIVE

## 2024-07-13 MED ORDER — PROMETHAZINE-DM 6.25-15 MG/5ML PO SYRP
5.0000 mL | ORAL_SOLUTION | Freq: Every evening | ORAL | 0 refills | Status: DC | PRN
  Filled 2024-07-13: qty 118, 23d supply, fill #0

## 2024-07-13 MED ORDER — GUAIFENESIN ER 600 MG PO TB12
600.0000 mg | ORAL_TABLET | Freq: Two times a day (BID) | ORAL | 0 refills | Status: DC
  Filled 2024-07-13: qty 30, 15d supply, fill #0

## 2024-07-13 NOTE — ED Provider Notes (Signed)
 [facility]    CSN: [id number] Arrival date & time: 07/13/24  1432      History   Chief Complaint Chief Complaint  Patient presents with   Cough   Fever    HPI Amy Flowers is a 58 y.o. female.   Amy Flowers is a 58 y.o. female presenting for chief complaint of Cough and Fever that started 4 days ago.  Cough is dry nonproductive/worse at nighttime.  She feels like she had a fever at home but did not take her temp.  She reports intermittent shortness of breath associated with coughing.  She had a few episodes of nonbloody diarrhea at the beginning of the illness and has not had any further diarrhea in the last 2 or 3 days.  Denies chest pain, nausea, vomiting, abdominal pain, and rash.  Never smoker, denies history of asthma/COPD.  No recent sick contacts with similar symptoms.  She has not attempted use of any over-the-counter medications to help with symptoms PTA.   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      Home Medications    Prior to Admission medications   Medication Sig Start  Date End Date Taking? Authorizing Provider  guaiFENesin  (MUCINEX ) 600 MG 12 hr tablet Take 1 tablet (600 mg total) by mouth 2 (two) times daily. 07/13/24  Yes Enedelia Dorna HERO, FNP  promethazine -dextromethorphan (PROMETHAZINE -DM) 6.25-15 MG/5ML syrup Take 5 mLs by mouth at bedtime as needed for cough. 07/13/24  Yes Enedelia Dorna HERO, FNP  acetaminophen  (TYLENOL ) 325 MG tablet Take 2 tablets (650 mg total) by mouth every 6 (six) hours as needed. 09/10/23   Johnie Flaming A, NP  carbamide peroxide (DEBROX) 6.5 % OTIC solution Place 5 drops into the left ear 2 (two) times daily. Patient not taking: Reported on 05/30/2024 10/14/23   Newlin, Enobong, MD  cetirizine  (ZYRTEC  ALLERGY ) 10 MG tablet Take 1 tablet (10 mg total) by mouth 2 (two) times daily as needed (allergies or hives). Patient not taking: Reported on 05/30/2024 11/24/23   Tobie Arleta SQUIBB, MD  cholecalciferol (VITAMIN D3) 25 MCG (1000 UNIT) tablet Take 1,000 Units by mouth daily.    [provider]  diphenhydrAMINE  (BENADRYL  ALLERGY ) 25 mg capsule Take 1 capsule (25 mg total) by mouth every 8 (eight) hours as needed. Allergic reaction Patient not taking: Reported on 05/30/2024 10/14/23   Newlin, Enobong, MD  EPINEPHrine  0.3 mg/0.3 mL IJ SOAJ injection Inject 0.3 mg into the muscle as needed for anaphylaxis. Patient not taking: Reported on 05/30/2024 10/14/23   Newlin, Enobong, MD  famotidine  (PEPCID ) 20 MG  tablet Take 1 tablet (20 mg total) by mouth 2 (two) times daily as needed (hives). Patient not taking: Reported on 05/30/2024 11/24/23   Tobie Arleta SQUIBB, MD  fluticasone  (FLONASE ) 50 MCG/ACT nasal spray Place 2 sprays into both nostrils daily. Patient not taking: Reported on 05/30/2024 11/24/23   Tobie Arleta SQUIBB, MD  hydrocortisone  cream 1 % Apply topically 4 (four) times daily as needed for itching. Patient not taking: Reported on 05/30/2024 10/03/23   Danton Reyes DASEN, MD  loratadine  (CLARITIN ) 10 MG tablet Take 1 tablet (10 mg  total) by mouth daily. Patient not taking: Reported on 05/30/2024 10/04/23   Danton Reyes DASEN, MD    Family History History reviewed. No pertinent family history.  Social History Social History   Tobacco Use   Smoking status: Never    Passive exposure: Never   Smokeless tobacco: Never  Vaping Use   Vaping status: Never Used  Substance Use Topics   Alcohol use: No   Drug use: No     Allergies   Cyclobenzaprine    Review of Systems Review of Systems  Constitutional:  Positive for fever.  Respiratory:  Positive for cough.   Per HPI   Physical Exam Triage Vital Signs ED Triage Vitals  Encounter Vitals Group     BP 07/13/24 1505 125/76     Girls Systolic BP Percentile --      Girls Diastolic BP Percentile --      Boys Systolic BP Percentile --      Boys Diastolic BP Percentile --      Pulse Rate 07/13/24 1505 79     Resp 07/13/24 1505 18     Temp 07/13/24 1505 98 F (36.7 C)     Temp Source 07/13/24 1505 Oral     SpO2 07/13/24 1505 97 %     Weight --      Height --      Head Circumference --      Peak Flow --      Pain Score 07/13/24 1506 10     Pain Loc --      Pain Education --      Exclude from Growth Chart --    No data found.  Updated Vital Signs BP 125/76 (BP Location: Right Arm)   Pulse 79   Temp 98 F (36.7 C) (Oral)   Resp 18   SpO2 97%   Visual Acuity Right Eye Distance:   Left Eye Distance:   Bilateral Distance:    Right Eye Near:   Left Eye Near:    Bilateral Near:     Physical Exam Vitals and nursing note reviewed.  Constitutional:      Appearance: She is not ill-appearing or toxic-appearing.  HENT:     Head: Normocephalic and atraumatic.     Right Ear: Hearing, tympanic membrane, ear canal and external ear normal.     Left Ear: Hearing, tympanic membrane, ear canal and external ear normal.     Nose: Nose normal.     Mouth/Throat:     Lips: Pink.     Mouth: Mucous membranes are moist. No injury or oral lesions.      Dentition: Normal dentition.     Tongue: No lesions.     Pharynx: Oropharynx is clear. Uvula midline. No pharyngeal swelling, oropharyngeal exudate, posterior oropharyngeal erythema, uvula swelling or postnasal drip.     Tonsils: No tonsillar exudate.  Eyes:     General: Lids are normal. Vision grossly intact.  Gaze aligned appropriately.     Extraocular Movements: Extraocular movements intact.     Conjunctiva/sclera: Conjunctivae normal.  Neck:     Trachea: Trachea and phonation normal.  Cardiovascular:     Rate and Rhythm: Normal rate and regular rhythm.     Heart sounds: Normal heart sounds, S1 normal and S2 normal.  Pulmonary:     Effort: Pulmonary effort is normal. No respiratory distress.     Breath sounds: Normal breath sounds and air entry. No wheezing, rhonchi or rales.  Chest:     Chest wall: No tenderness.  Musculoskeletal:     Cervical back: Neck supple.  Lymphadenopathy:     Cervical: No cervical adenopathy.  Skin:    General: Skin is warm and dry.     Capillary Refill: Capillary refill takes less than 2 seconds.     Findings: No rash.  Neurological:     General: No focal deficit present.     Mental Status: She is alert and oriented to person, place, and time. Mental status is at baseline.     Cranial Nerves: No dysarthria or facial asymmetry.  Psychiatric:        Mood and Affect: Mood normal.        Speech: Speech normal.        Behavior: Behavior normal.        Thought Content: Thought content normal.        Judgment: Judgment normal.      UC Treatments / Results  Labs (all labs ordered are listed, but only abnormal results are displayed) Labs Reviewed  POC SARS CORONAVIRUS 2 AG -  ED    EKG   Radiology No results found.  Procedures Procedures (including critical care time)  Medications Ordered in UC Medications - No data to display  Initial Impression / Assessment and Plan / UC Course  I have reviewed the triage vital signs and the nursing  notes.  Pertinent labs & imaging results that were available during my care of the patient were reviewed by me and considered in my medical decision making (see chart for details).   1. Viral URI with cough Suspect viral URI, viral syndrome.  Strep/viral testing: COVID-19 testing is negative.  Physical exam findings reassuring, vital signs hemodynamically stable, and lungs clear, therefore deferred imaging of the chest.  Advised supportive care/prescriptions for symptomatic relief as outlined in AVS.    Counseled patient on potential for adverse effects with medications prescribed/recommended today, strict ER and return-to-clinic precautions discussed, patient verbalized understanding.    Final Clinical Impressions(s) / UC Diagnoses   Final diagnoses:  Viral URI with cough     Discharge Instructions      ??? ????? ?? ??? ??????? ??????? ???????? ?? ?????? ?????? ??????? ???????? ?????? ???????.  ?? ????? ????????? ???????????? (??????? ????)? ??????? ????? ??????? ?? ????? ???????.  ???? ?????????? ?? ?? ?????? ??? ????? ??? ??????. ?? ????? ?? ??? ?????? ??????? ??? ?????? ??? ????? ???.  ??????? ??????? ?? ????? ?? ??? ?? ????? ?????? ?? 4-6 ????? ?? ???????? ?? ????? ???? ?????.  ?? ?????? ??? ???? ????? ?? ?????? ????? ??? ????? ?????? (??? ?????? ????? ??????).  ?? ???? ??? ?????? ???? ??????? ???? ?????? ??? ???????? ??????? ?? ??????? ??? ????? ?????? ???? ?? ?????? ????????? ?? ??????? ?? ?? ????? ???? ????? ????? ?????? ??? ??? ??????? ?????? ???? ?? ???????. ??? ??? ??????? ??????? ??? ??????? ???? ????? ?? ???? ??????? ???????. 'ant tueani min marad fayrusi,  wasayatahasan tlqayyan mae alraahat watanawul alsawayil wal'adwiat litakhfif al'aeradi. qad yusaeid taylinul, waghuayfinisin (musinkis eadi), wabakhaakhat al'anf almalihat fi takhfif al'aeradi. sharab brumithazin di 'iim lilsieal qabl alnawm hasab alhajati. qad yusbb lak hadha aldawa' alnueasi, lidha tanawalah qabl  alnawm faqat. mileaqatan saghiratan min aleasal fi kub min alma' aldaafi kula 4-6 saeat qad yusaedan fi takhfif alam alhalqa. qad yusaed wade katheryne tartib fi alghurfat lylan ealaa tahdiat alsueal (nazaf alghurfat jydan ywmyan). fi halat 'alam alsadr, wadiq altanafusi, waeadam alqudrat ealaa alaihtifaz bialtaeam 'aw alsawayil dun taqayuwin, walhumaa alati la tastajib litaylinul 'aw mutrin, 'aw 'ayi 'aerad hadat 'ukhraa, yurja altawajuh 'iilaa qism altawari li'iijra' mazid min altaqyimi. eud 'iilaa alrieayat aleajilat eind alhajati, wa'iilaa fatabae mae tabib alrieayat al'awaliati.  You have a viral illness which will improve on its own with rest, fluids, and medications to help with your symptoms.  Tylenol , guaifenesin  (plain mucinex ), and saline nasal sprays may help relieve symptoms.   Promethazine  DM cough syrup at bedtime as needed.  This medication can make you sleepy so only take it at bedtime.  Two teaspoons of honey in 1 cup of warm water every 4-6 hours may help with throat pains.  Humidifier in room at nighttime may help soothe cough (clean well daily).   For chest pain, shortness of breath, inability to keep food or fluids down without vomiting, fever that does not respond to tylenol  or motrin , or any other severe symptoms, please go to the ER for further evaluation. Return to urgent care as needed, otherwise follow-up with PCP.       ED Prescriptions     Medication Sig Dispense Auth. Provider   promethazine -dextromethorphan (PROMETHAZINE -DM) 6.25-15 MG/5ML syrup Take 5 mLs by mouth at bedtime as needed for cough. 118 mL Enedelia Going M, FNP   guaiFENesin  (MUCINEX ) 600 MG 12 hr tablet Take 1 tablet (600 mg total) by mouth 2 (two) times daily. 30 tablet Enedelia Going HERO, FNP      PDMP not reviewed this encounter.   Enedelia Going HERO, [place] 07/13/24 [phone]

## 2024-07-13 NOTE — Discharge Instructions (Addendum)
??? ????? ?? ??? ??????? ??????? ???????? ?? ?????? ?????? ??????? ???????? ?????? ???????.  ?? ????? ????????? ???????????? (??????? ????)? ??????? ????? ??????? ?? ????? ???????.  ???? ?????????? ?? ?? ?????? ??? ????? ??? ??????. ?? ????? ?? ??? ?????? ??????? ??? ?????? ??? ????? ???.  ??????? ??????? ?? ????? ?? ??? ?? ????? ?????? ??   4-6 ????? ?? ???????? ?? ????? ???? ?????.  ?? ?????? ??? ???? ????? ?? ?????? ????? ??? ????? ?????? (??? ?????? ????? ??????).  ?? ???? ??? ?????? ???? ??????? ???? ?????? ??? ???????? ??????? ?? ??????? ??? ????? ?????? ???? ?? ?????? ????????? ?? ??????? ?? ?? ????? ???? ????? ????? ?????? ??? ??? ??????? ?????? ???? ?? ???????. ??? ??? ??????? ??????? ??? ??????? ???? ????? ?? ???? ??????? ???????. 'ant tueani min marad fayrusi, wasayatahasan tlqayyan mae alraahat watanawul alsawayil wal'adwiat litakhfif al'aeradi. qad yusaeid taylinul, waghuayfinisin (musinkis eadi), wabakhaakhat al'anf almalihat fi takhfif al'aeradi. sharab brumithazin di 'iim lilsieal qabl alnawm hasab alhajati. qad yusbb lak hadha aldawa' alnueasi, lidha tanawalah qabl alnawm faqat. mileaqatan saghiratan min aleasal fi kub min alma' aldaafi kula 4-6 saeat qad yusaedan fi takhfif alam alhalqa. qad yusaed wade katheryne tartib fi alghurfat lylan ealaa tahdiat alsueal (nazaf alghurfat jydan ywmyan). fi halat 'alam alsadr, wadiq altanafusi, waeadam alqudrat ealaa alaihtifaz bialtaeam 'aw alsawayil dun taqayuwin, walhumaa alati la tastajib litaylinul 'aw mutrin, 'aw 'ayi 'aerad hadat 'ukhraa, yurja altawajuh 'iilaa qism altawari li'iijra' mazid min altaqyimi. eud 'iilaa alrieayat aleajilat eind alhajati, wa'iilaa fatabae mae tabib alrieayat al'awaliati.  You have a viral illness which will improve on its own with rest, fluids, and medications to help with your symptoms.  Tylenol , guaifenesin  (plain mucinex ), and saline nasal sprays may help relieve symptoms.   Promethazine  DM cough syrup at  bedtime as needed.  This medication can make you sleepy so only take it at bedtime.  Two teaspoons of honey in 1 cup of warm water every 4-6 hours may help with throat pains.  Humidifier in room at nighttime may help soothe cough (clean well daily).   For chest pain, shortness of breath, inability to keep food or fluids down without vomiting, fever that does not respond to tylenol  or motrin , or any other severe symptoms, please go to the ER for further evaluation. Return to urgent care as needed, otherwise follow-up with PCP.

## 2024-07-13 NOTE — Medical Student Note (Signed)
 [facility] Insurance account manager Note For educational purposes for Medical, PA and NP students only and not part of the legal medical record.   CSN: [id number] Arrival date & time: 07/13/24  1432      History   Chief Complaint Chief Complaint  Patient presents with   Cough   Fever    HPI Amy Flowers is a 58 y.o. female.  Patient presents today with family for cough and fever for 4 days.  Patient states she currently feels weak and the cough is so intense that if makes her short of breath. Patient states cough is not productive.  Unsure of max temp at home just feels like she has a fever. No known sick contacts. No recent travel. Endorses some diarrhea at start of illness but has since resolved. Has taken tylenol  for symptoms with little improvement.  Denies ear pain, sore throat, chest pain, nausea, vomiting, abdominal pain, back pain.     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      Home Medications    Prior to Admission medications   Medication Sig Start Date End Date Taking?  Authorizing Provider  acetaminophen  (TYLENOL ) 325 MG tablet Take 2 tablets (650 mg total) by mouth every 6 (six) hours as needed. 09/10/23   Johnie Flaming A, NP  carbamide peroxide (DEBROX) 6.5 % OTIC solution Place 5 drops into the left ear 2 (two) times daily. Patient not taking: Reported on 05/30/2024 10/14/23   Newlin, Enobong, MD  cetirizine  (ZYRTEC  ALLERGY ) 10 MG tablet Take 1 tablet (10 mg total) by mouth 2 (two) times daily as needed (allergies or hives). Patient not taking: Reported on 05/30/2024 11/24/23   Tobie Arleta SQUIBB, MD  cholecalciferol (VITAMIN D3) 25 MCG (1000 UNIT) tablet Take 1,000 Units by mouth daily.    [provider]  diphenhydrAMINE  (BENADRYL  ALLERGY ) 25 mg capsule Take 1 capsule (25 mg total) by mouth every 8 (eight) hours as needed. Allergic reaction Patient not taking: Reported on 05/30/2024 10/14/23   Newlin, Enobong, MD  EPINEPHrine  0.3 mg/0.3 mL IJ SOAJ injection Inject 0.3 mg into the muscle as needed for anaphylaxis. Patient not taking: Reported on 05/30/2024 10/14/23   Newlin, Enobong, MD  famotidine  (PEPCID ) 20 MG tablet Take 1 tablet (20 mg total) by mouth 2 (two) times daily as needed (hives). Patient not taking: Reported on 05/30/2024 11/24/23   Tobie Arleta SQUIBB, MD  fluticasone  (FLONASE ) 50 MCG/ACT nasal spray Place 2 sprays into both nostrils daily. Patient not taking: Reported on 05/30/2024 11/24/23   Tobie Arleta SQUIBB, MD  hydrocortisone  cream  1 % Apply topically 4 (four) times daily as needed for itching. Patient not taking: Reported on 05/30/2024 10/03/23   Danton Reyes DASEN, MD  loratadine  (CLARITIN ) 10 MG tablet Take 1 tablet (10 mg total) by mouth daily. Patient not taking: Reported on 05/30/2024 10/04/23   Danton Reyes DASEN, MD    Family History History reviewed. No pertinent family history.  Social History Social History   Tobacco Use   Smoking status: Never    Passive exposure: Never   Smokeless tobacco: Never  Vaping Use   Vaping status:  Never Used  Substance Use Topics   Alcohol use: No   Drug use: No     Allergies   Cyclobenzaprine    Review of Systems Review of Systems  Constitutional:  Positive for fever.  Respiratory:  Positive for cough.    PER HPI  Physical Exam Updated Vital Signs BP 125/76 (BP Location: Right Arm)   Pulse 79   Temp 98 F (36.7 C) (Oral)   Resp 18   SpO2 97%   Physical Exam Vitals and nursing note reviewed.  Constitutional:      Appearance: Normal appearance. She is ill-appearing.  HENT:     Right Ear: Tympanic membrane normal.     Left Ear: Tympanic membrane normal.     Mouth/Throat:     Mouth: Mucous membranes are moist.     Pharynx: No pharyngeal swelling, oropharyngeal exudate or posterior oropharyngeal erythema.  Eyes:     Pupils: Pupils are equal, round, and reactive to light.  Cardiovascular:     Rate and Rhythm: Normal rate and regular rhythm.     Pulses: Normal pulses.     Heart sounds: Normal heart sounds.  Pulmonary:     Effort: Pulmonary effort is normal. No respiratory distress.     Breath sounds: Normal breath sounds. No stridor. No wheezing, rhonchi or rales.  Abdominal:     Palpations: Abdomen is soft.     Tenderness: There is no abdominal tenderness.  Musculoskeletal:     Cervical back: Neck supple. No tenderness.  Lymphadenopathy:     Cervical: No cervical adenopathy.  Skin:    General: Skin is warm and dry.  Neurological:     Mental Status: She is alert.  Psychiatric:        Behavior: Behavior normal.      ED Treatments / Results  Labs (all labs ordered are listed, but only abnormal results are displayed) Labs Reviewed  POC SARS CORONAVIRUS 2 AG -  ED    EKG  Radiology No results found.  Procedures Procedures (including critical care time)  Medications Ordered in ED Medications - No data to display   Initial Impression / Assessment and Plan / ED Course  I have reviewed the triage vital signs and the nursing  notes.  Pertinent labs & imaging results that were available during my care of the patient were reviewed by me and considered in my medical decision making (see chart for details).   1. Viral URI Symptoms are consistent with a viral upper respiratory infection. Not suspicious for strep throat, otitis media, flu.  Rapid COVID test negative.  Physical exam is reassuring.  Prescribed guaifenesin  600mg  BID and Promethazine -DM at night for cough. Strict return precautions if symptoms do not improve or worsen.     Final Clinical Impressions(s) / ED Diagnoses   Final diagnoses:  Acute cough    New Prescriptions New Prescriptions   No medications on file

## 2024-07-13 NOTE — ED Triage Notes (Signed)
 Pt c/o cough and fever x4 days. States worse at night. Took tylenol  yesterday.

## 2024-07-16 ENCOUNTER — Ambulatory Visit (INDEPENDENT_AMBULATORY_CARE_PROVIDER_SITE_OTHER): Payer: Self-pay

## 2024-07-16 ENCOUNTER — Encounter (HOSPITAL_COMMUNITY): Payer: Self-pay | Admitting: Emergency Medicine

## 2024-07-16 ENCOUNTER — Ambulatory Visit (HOSPITAL_COMMUNITY)
Admission: EM | Admit: 2024-07-16 | Discharge: 2024-07-16 | Disposition: A | Discharge status: Home / Self Care | Payer: Self-pay | Attending: Internal Medicine | Admitting: Internal Medicine

## 2024-07-16 DIAGNOSIS — R0602 Shortness of breath: Secondary | ICD-10-CM

## 2024-07-16 DIAGNOSIS — J22 Unspecified acute lower respiratory infection: Secondary | ICD-10-CM

## 2024-07-16 DIAGNOSIS — R051 Acute cough: Secondary | ICD-10-CM

## 2024-07-16 MED ORDER — ALBUTEROL SULFATE HFA 108 (90 BASE) MCG/ACT IN AERS
INHALATION_SPRAY | RESPIRATORY_TRACT | Status: AC
  Filled 2024-07-16: qty 6.7

## 2024-07-16 MED ORDER — ALBUTEROL SULFATE HFA 108 (90 BASE) MCG/ACT IN AERS
2.0000 | INHALATION_SPRAY | Freq: Once | RESPIRATORY_TRACT | Status: AC
  Administered 2024-07-16: 2 via RESPIRATORY_TRACT

## 2024-07-16 MED ORDER — AZITHROMYCIN 250 MG PO TABS: ORAL_TABLET | ORAL | 0 refills | Status: DC

## 2024-07-16 NOTE — Discharge Instructions (Addendum)
 Chest x-ray done today.  Final evaluation by the radiologist is still pending but on brief evaluation there does not appear to be a pneumonia present. Symptoms and physical exam findings are consistent with a lower respiratory infection.  Due to the severity and duration of symptoms we will treat with the following:   Azithromycin  250mg  Take 2 tablets today and the 1 tablet daily for 4 more days. Albuterol  inhaler 1-2 puffs every 6 hours as needed for wheezing/shortness of breath. (Given in clinic today) May continue Promethazine  DM 5 mL every 8 hours as needed for cough.  Use caution as this medication can cause drowsiness.  Make sure to stay hydrated by drinking plenty of water. Rest when needed.  Return to urgent care or PCP if symptoms worsen or fail to resolve.

## 2024-07-16 NOTE — ED Provider Notes (Signed)
 [facility]    CSN: [id number] Arrival date & time: 07/16/24  1105      History   Chief Complaint Chief Complaint  Patient presents with   Cough   Emesis    HPI Amy Flowers is a 58 y.o. female.   58 year old female who presents urgent care with complaints of worsening cough, shortness of breath, fever, chest tightness and new onset of nausea and vomiting.  She was seen on August 6 secondary to several days of a cough.  At that time it was felt to be more of a viral condition then she was given medication for cough.  She reports that since then the symptoms have worsened.  She now has increased shortness of breath and tightness in her chest especially with coughing.  3 days ago she also developed nausea and vomiting.  She is able to eat and drink although not as much as usual.  She is also having fevers and chills.   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      Home Medications    Prior to Admission medications   Medication Sig Start Date End Date Taking? Authorizing Provider  azithromycin  (ZITHROMAX ) 250 MG tablet Take first 2 tablets together, then 1 every day until finished. 07/16/24  Yes Manette Doto A, PA-C  acetaminophen  (TYLENOL ) 325 MG tablet Take 2 tablets (650 mg total) by mouth every 6 (six) hours as needed. 09/10/23   Johnie Flaming A, NP  carbamide peroxide (DEBROX) 6.5 % OTIC solution Place 5 drops into the left ear 2 (two) times daily. Patient not taking: Reported on 05/30/2024 10/14/23   Newlin, Enobong, MD  cetirizine  (ZYRTEC  ALLERGY ) 10 MG tablet Take 1 tablet (10 mg total) by mouth 2 (two) times daily as needed (allergies or hives). Patient not taking: Reported on 05/30/2024 11/24/23   Tobie Arleta SQUIBB, MD  cholecalciferol (VITAMIN D3) 25 MCG (1000 UNIT) tablet Take 1,000 Units by mouth daily.    [provider]  diphenhydrAMINE  (BENADRYL  ALLERGY ) 25 mg capsule Take 1 capsule (25 mg total) by mouth every 8 (eight) hours as needed. Allergic reaction Patient not taking: Reported on 05/30/2024 10/14/23   Newlin, Enobong, MD  EPINEPHrine  0.3 mg/0.3 mL IJ SOAJ injection Inject 0.3 mg into the muscle as needed for anaphylaxis. Patient not taking: Reported on 05/30/2024 10/14/23   Newlin, Enobong, MD  famotidine  (PEPCID ) 20 MG tablet Take 1 tablet (20 mg total) by mouth 2 (two) times daily as needed (hives). Patient not taking: Reported on 05/30/2024 11/24/23   Tobie Arleta SQUIBB, MD  fluticasone  (FLONASE ) 50 MCG/ACT nasal spray Place 2 sprays into both nostrils daily. Patient not taking: Reported on 05/30/2024 11/24/23   Tobie Arleta SQUIBB, MD  guaiFENesin  (MUCINEX ) 600 MG 12 hr tablet Take 1 tablet (600 mg total) by mouth 2 (two) times daily. 07/13/24   Enedelia Dorna HERO, FNP  hydrocortisone  cream 1 % Apply topically 4 (four) times daily as needed for  itching. Patient not taking: Reported on 05/30/2024 10/03/23   Danton Reyes DASEN, MD  loratadine  (CLARITIN ) 10 MG tablet Take 1 tablet (10 mg total) by mouth daily. Patient not taking: Reported on 05/30/2024 10/04/23   Danton Reyes DASEN, MD  promethazine -dextromethorphan (PROMETHAZINE -DM) 6.25-15 MG/5ML syrup Take 5 mLs by mouth at bedtime as needed for cough. 07/13/24   Enedelia Dorna HERO, FNP    Family History No family history on file.  Social History Social History   Tobacco Use   Smoking status: Never    Passive exposure: Never   Smokeless tobacco: Never  Vaping Use   Vaping status: Never Used  Substance Use Topics   Alcohol use: No   Drug use: No     Allergies   Cyclobenzaprine    Review of Systems Review of Systems  Constitutional:  Positive for appetite change, chills and fever.  HENT:  Negative for ear pain and sore throat.   Eyes:  Negative for pain and visual disturbance.  Respiratory:  Positive for cough, chest tightness and shortness of breath.   Cardiovascular:  Negative for chest pain and palpitations.  Gastrointestinal:  Positive for nausea and vomiting. Negative for abdominal pain.  Genitourinary:  Negative for dysuria and hematuria.  Musculoskeletal:  Negative for arthralgias and back pain.  Skin:  Negative for color change and rash.  Neurological:  Negative for seizures and syncope.  All other systems reviewed and are negative.    Physical Exam Triage Vital Signs ED Triage Vitals [07/16/24 1215]  Encounter Vitals Group     BP 130/71     Girls Systolic BP Percentile      Girls Diastolic BP Percentile      Boys Systolic BP Percentile      Boys Diastolic BP Percentile      Pulse Rate 73     Resp 20     Temp 98 F (36.7 C)     Temp Source Oral     SpO2 98 %     Weight      Height      Head Circumference      Peak Flow      Pain Score      Pain Loc      Pain Education      Exclude from Growth Chart    No data found.  Updated Vital  Signs BP 130/71 (BP Location: Right Arm)   Pulse 73   Temp 98 F (36.7 C) (Oral)   Resp 20   SpO2 98%   Visual Acuity Right Eye Distance:   Left Eye Distance:   Bilateral Distance:    Right Eye Near:   Left Eye Near:    Bilateral Near:     Physical Exam Vitals and nursing note reviewed.  Constitutional:      General: She is not in acute distress.    Appearance: She is well-developed.  HENT:     Head: Normocephalic and atraumatic.     Nose: Nose normal.     Mouth/Throat:     Mouth: Mucous membranes are moist.  Eyes:     Conjunctiva/sclera: Conjunctivae normal.  Cardiovascular:     Rate and Rhythm: Normal rate and regular rhythm.     Heart sounds: No murmur heard. Pulmonary:     Effort: Pulmonary effort is normal. No tachypnea or respiratory distress.     Breath sounds: Examination of the right-upper field reveals decreased breath sounds. Examination of the left-upper field reveals decreased breath sounds. Examination of the right-middle field reveals decreased breath sounds and wheezing. Examination of the left-middle field reveals decreased breath sounds and wheezing. Examination of the right-lower field reveals decreased breath sounds. Examination of the left-lower field reveals decreased breath sounds. Decreased breath sounds and wheezing present. No rhonchi.  Abdominal:     Palpations: Abdomen is soft.     Tenderness: There is no abdominal tenderness.  Musculoskeletal:        General: No swelling.     Cervical back: Neck supple.  Skin:    General: Skin is warm and dry.     Capillary Refill: Capillary refill takes less than 2 seconds.  Neurological:     Mental Status: She is alert.  Psychiatric:        Mood and Affect: Mood normal.      UC Treatments / Results  Labs (all labs ordered are listed, but only abnormal results are displayed) Labs Reviewed - No data to display  EKG   Radiology No results found.  Procedures Procedures (including critical care  time)  Medications Ordered in UC Medications  albuterol  (VENTOLIN  HFA) 108 (90 Base) MCG/ACT inhaler 2 puff (2 puffs Inhalation Given 07/16/24 1306)    Initial Impression / Assessment and Plan / UC Course  I have reviewed the triage vital signs and the nursing notes.  Pertinent labs & imaging results that were available during my care of the patient were reviewed by me and considered in my medical decision making (see chart for details).     Shortness of breath - Plan: DG Chest 2 View, DG Chest 2 View  Acute cough - Plan: DG Chest 2 View, DG Chest 2 View  Lower respiratory infection   Chest x-ray done today.  Final evaluation by the radiologist is still pending but on brief evaluation there does not appear to be a pneumonia present. Symptoms and physical exam findings are consistent with a lower respiratory infection.  Due to the severity and duration of symptoms we will treat with the following:   Azithromycin  250mg  Take 2 tablets today and the 1 tablet daily for 4 more days. Albuterol  inhaler 1-2 puffs every 6 hours as needed for wheezing/shortness of breath. (Given in clinic today) May continue Promethazine  DM 5 mL every 8 hours as needed for cough.  Use caution as this medication can cause drowsiness.  Make sure to stay hydrated by drinking plenty of water. Rest when needed.  Return to urgent care or PCP if symptoms worsen or fail to resolve.    Final Clinical Impressions(s) / UC Diagnoses   Final diagnoses:  Shortness of breath  Acute cough  Lower respiratory infection     Discharge Instructions      Chest x-ray done today.  Final evaluation by the radiologist is still pending but on brief evaluation there does not appear to be a pneumonia present. Symptoms and physical exam findings are  consistent with a lower respiratory infection.  Due to the severity and duration of symptoms we will treat with the following:   Azithromycin  250mg  Take 2 tablets today and the 1 tablet  daily for 4 more days. Albuterol  inhaler 1-2 puffs every 6 hours as needed for wheezing/shortness of breath. (Given in clinic today) May continue Promethazine  DM 5 mL every 8 hours as needed for cough.  Use caution as this medication can cause drowsiness.  Make sure to stay hydrated by drinking plenty of water. Rest when needed.  Return to urgent care or PCP if symptoms worsen or fail to resolve.      ED Prescriptions     Medication Sig Dispense Auth. Provider   azithromycin  (ZITHROMAX ) 250 MG tablet Take first 2 tablets together, then 1 every day until finished. 6 tablet Teresa Almarie LABOR, PA-C      PDMP not reviewed this encounter.   Teresa Almarie LABOR, PA-C 07/16/24 1346

## 2024-07-16 NOTE — ED Triage Notes (Addendum)
 Pt to ED with c/o cough, fever, nausea and vomiting.  Pt was seen here on 8/6 for cough and fever and given cough meds.  Daughter st's she is not getting any better and vomiting started 3 days ago. Also c/o pain in chest when she is coughing

## 2024-07-18 ENCOUNTER — Ambulatory Visit (HOSPITAL_COMMUNITY): Payer: Self-pay

## 2024-07-22 ENCOUNTER — Other Ambulatory Visit: Payer: Self-pay

## 2024-07-22 ENCOUNTER — Emergency Department (HOSPITAL_COMMUNITY): Payer: Self-pay

## 2024-07-22 ENCOUNTER — Encounter (HOSPITAL_COMMUNITY): Payer: Self-pay

## 2024-07-22 ENCOUNTER — Emergency Department (HOSPITAL_COMMUNITY)
Admission: EM | Admit: 2024-07-22 | Discharge: 2024-07-22 | Disposition: A | Discharge status: Home / Self Care | Payer: Self-pay

## 2024-07-22 DIAGNOSIS — R052 Subacute cough: Secondary | ICD-10-CM | POA: Insufficient documentation

## 2024-07-22 LAB — BASIC METABOLIC PANEL WITH GFR
Anion gap: 11 (ref 5–15)
BUN: 6 mg/dL (ref 6–20)
CO2: 25 mmol/L (ref 22–32)
Calcium: 9.6 mg/dL (ref 8.9–10.3)
Chloride: 104 mmol/L (ref 98–111)
Creatinine, Ser: 0.71 mg/dL (ref 0.44–1.00)
GFR, Estimated: >60 mL/min (ref >60)
Glucose, Bld: 106 mg/dL — ABNORMAL HIGH (ref 70–99)
Potassium: 3.5 mmol/L (ref 3.5–5.1)
Sodium: 140 mmol/L (ref 135–145)

## 2024-07-22 LAB — CBC
HCT: 40.9 % (ref 36.0–46.0)
Hemoglobin: 13.3 g/dL (ref 12.0–15.0)
MCH: 30.4 pg (ref 26.0–34.0)
MCHC: 32.5 g/dL (ref 30.0–36.0)
MCV: 93.4 fL (ref 80.0–100.0)
Platelets: 214 K/uL (ref 150–400)
RBC: 4.38 MIL/uL (ref 3.87–5.11)
RDW: 12.5 % (ref 11.5–15.5)
WBC: 5.1 K/uL (ref 4.0–10.5)
nRBC: 0 % (ref 0.0–0.2)

## 2024-07-22 LAB — TROPONIN I (HIGH SENSITIVITY): Troponin I (High Sensitivity): 3 ng/L (ref <18)

## 2024-07-22 LAB — BRAIN NATRIURETIC PEPTIDE: B Natriuretic Peptide: 38.3 pg/mL (ref 0.0–100.0)

## 2024-07-22 MED ORDER — ALBUTEROL SULFATE HFA 108 (90 BASE) MCG/ACT IN AERS: 2.0000 | INHALATION_SPRAY | RESPIRATORY_TRACT | 0 refills | Status: DC | PRN

## 2024-07-22 MED ORDER — IOHEXOL 350 MG/ML SOLN
75.0000 mL | Freq: Once | INTRAVENOUS | Status: AC | PRN
  Administered 2024-07-22: 75 mL via INTRAVENOUS

## 2024-07-22 NOTE — Discharge Instructions (Signed)
 Thank you for allowing us  to care for you today.  Based upon your CT imaging there is evidence of fibrosis or scarring.  Based upon this imaging and your associated cough, we are giving you a prescription for an albuterol inhaler to be used 2 puffs every 4 hours as needed for wheezing and shortness of breath.  We are also giving you a referral to a pulmonologist for outpatient follow-up.  Please be sure to call the number listed and follow-up with your primary care provider as well.  Please return to the emergency department if you experience worsening shortness of breath, chest pain, passout or believe you need emergent medical care.

## 2024-07-22 NOTE — ED Triage Notes (Signed)
 Patient reports central chest pain with SOB , chest congestion and productive cough for several weeks , denies emesis or diaphoresis , no fever or chills .

## 2024-07-22 NOTE — ED Notes (Signed)
 Patient transported to CT

## 2024-07-22 NOTE — ED Provider Notes (Signed)
 [facility] Provider Note   CSN: [id number] Arrival date & time: 07/22/24  0602     Patient presents with: Chest Pain and Cough   Amy Flowers is a 58 y.o. female past medical history significant for GERD, polyclonal gammopathy, vasculitis, angioedema, OA who presents emergency department for cough and fever.  Patient is accompanied by her daughter who helps provide medical history.  Patient's daughter states that for the past 20 days the patient has experienced productive cough and intermittent fevers.  She states that she went to urgent care on 8/9 and was prescribed azithromycin  however this did not help her symptoms.  She endorses continued productive cough, fever and lethargy.  She does endorse intermittent posttussive hemoptysis.  Denies associated chest pain, shortness of breath, syncope, dysuria, decreased p.o. intake, melena, hematochezia, hematemesis.  Patient's daughter states that patient is currently not on any medications other than multivitamins.  She states that she was taken off medications for vasculitis and currently does not take any however does not remember which medicines she was on.   Patient and patient's daughter offered interpreter however declined at this time.  Patient's daughter will translate for patient.     Chest Pain Associated symptoms: cough   Cough Associated symptoms: chest pain        Prior to Admission medications   Medication Sig Start Date End Date Taking? Authorizing Provider  albuterol  (VENTOLIN  HFA) 108 (90 Base) MCG/ACT inhaler Inhale 2 puffs into the lungs every 4 (four) hours as needed for wheezing or shortness of breath. 07/22/24  Yes Nada Chroman, DO  acetaminophen  (TYLENOL ) 325 MG tablet Take 2 tablets (650 mg total) by mouth every 6 (six) hours as needed. 09/10/23   Johnie Flaming A, NP  azithromycin  (ZITHROMAX ) 250 MG tablet Take first 2 tablets together, then 1 every day until finished.  07/16/24   White, Elizabeth A, PA-C  carbamide peroxide (DEBROX) 6.5 % OTIC solution Place 5 drops into the left ear 2 (two) times daily. Patient not taking: Reported on 05/30/2024 10/14/23   Newlin, Enobong, MD  cetirizine  (ZYRTEC  ALLERGY ) 10 MG tablet Take 1 tablet (10 mg total) by mouth 2 (two) times daily as needed (allergies or hives). Patient not taking: Reported on 05/30/2024 11/24/23   Tobie Arleta SQUIBB, MD  cholecalciferol (VITAMIN D3) 25 MCG (1000 UNIT) tablet Take 1,000 Units by mouth daily.    [provider]  diphenhydrAMINE  (BENADRYL  ALLERGY ) 25 mg capsule Take 1 capsule (25 mg total) by mouth every 8 (eight) hours as needed. Allergic reaction Patient not taking: Reported on 05/30/2024 10/14/23   Newlin, Enobong, MD  EPINEPHrine  0.3 mg/0.3 mL IJ SOAJ injection Inject 0.3 mg into the muscle as needed for anaphylaxis. Patient not taking: Reported on 05/30/2024 10/14/23   Newlin, Enobong, MD  famotidine  (PEPCID ) 20 MG tablet Take 1 tablet (20 mg total) by mouth 2 (two) times daily as needed (hives). Patient not taking: Reported on 05/30/2024 11/24/23   Tobie Arleta SQUIBB, MD  fluticasone  (FLONASE ) 50 MCG/ACT nasal spray Place 2 sprays into both nostrils daily. Patient not taking: Reported on 05/30/2024 11/24/23   Tobie Arleta SQUIBB, MD  guaiFENesin  (MUCINEX ) 600 MG 12 hr tablet Take 1 tablet (600 mg total) by mouth 2 (two) times daily. 07/13/24   Enedelia Dorna HERO, FNP  hydrocortisone  cream 1 % Apply topically 4 (four) times daily as needed for itching. Patient not taking: Reported on 05/30/2024 10/03/23   Danton Reyes DASEN, MD  loratadine  (CLARITIN ) 10 MG tablet Take 1 tablet (10 mg total) by mouth daily. Patient not taking: Reported on 05/30/2024 10/04/23   Danton Reyes DASEN, MD  promethazine -dextromethorphan (PROMETHAZINE -DM) 6.25-15 MG/5ML syrup Take 5 mLs by mouth at bedtime as needed for cough. 07/13/24   Enedelia Dorna HERO, FNP    Allergies: Cyclobenzaprine     Review of Systems   Respiratory:  Positive for cough.   Cardiovascular:  Positive for chest pain.    Updated Vital Signs BP 119/71   Pulse 81   Temp 98 F (36.7 C) (Oral)   Resp 15   Ht 5' (1.524 m)   Wt 65.3 kg   SpO2 99%   BMI 28.12 kg/m   Physical Exam Vitals reviewed.  Constitutional:      Appearance: She is not ill-appearing.  HENT:     Head: Normocephalic and atraumatic.  Eyes:     Pupils: Pupils are equal, round, and reactive to light.  Neck:     Vascular: No JVD.     Trachea: Trachea normal.  Cardiovascular:     Rate and Rhythm: Normal rate and regular rhythm.     Heart sounds: Normal heart sounds. No murmur heard. Pulmonary:     Effort: No tachypnea.     Breath sounds: No wheezing.     Comments: Expiratory wheezing Abdominal:     General: There is no distension.     Palpations: Abdomen is soft.     Tenderness: There is no abdominal tenderness.  Musculoskeletal:     Cervical back: Full passive range of motion without pain and normal range of motion.     Right lower leg: No edema.     Left lower leg: No edema.     Comments: Spontaneous movement of bilateral upper and lower extremities  Neurological:     Mental Status: She is alert.     (all labs ordered are listed, but only abnormal results are displayed) Labs Reviewed  BASIC METABOLIC PANEL WITH GFR - Abnormal; Notable for the following components:      Result Value   Glucose, Bld 106 (*)    All other components within normal limits  CBC  BRAIN NATRIURETIC PEPTIDE  TROPONIN I (HIGH SENSITIVITY)    EKG: EKG Interpretation Date/Time:  Friday July 22 2024 06:27:51 EDT Ventricular Rate:  91 PR Interval:  148 QRS Duration:  76 QT Interval:  356 QTC Calculation: 437 R Axis:   -3  Text Interpretation: Normal sinus rhythm Normal ECG When compared with ECG of 03-Oct-2023 04:51, PREVIOUS ECG IS PRESENT Confirmed by Neysa Clap [phone]) on 07/22/2024 7:31:25 AM  Radiology: CT Angio Chest PE W and/or Wo  Contrast Result Date: 07/22/2024 CLINICAL DATA:  hemoptysis, central chest pain and dyspnea with productive cough EXAM: CT ANGIOGRAPHY CHEST WITH CONTRAST TECHNIQUE: Multidetector CT imaging of the chest was performed using the standard protocol during bolus administration of intravenous contrast. Multiplanar CT image reconstructions and MIPs were obtained to evaluate the vascular anatomy. RADIATION DOSE REDUCTION: This exam was performed according to the departmental dose-optimization program which includes automated exposure control, adjustment of the mA and/or kV according to patient size and/or use of iterative reconstruction technique. CONTRAST:  75mL OMNIPAQUE  IOHEXOL  350 MG/ML SOLN COMPARISON:  June 27, 2017 FINDINGS: Pulmonary Embolism: No pulmonary embolism. Cardiovascular: No cardiomegaly or pericardial effusion.No aortic aneurysm. Scattered atherosclerosis throughout the aorta. Mediastinum/Nodes: No mediastinal mass. Small hiatal hernia. Subcentimeter multistation mediastinal and bilateral hilar lymph nodes. 1 cm subcarinal lymph node. Lungs/Pleura: The  midline trachea and bronchi are patent. Biapical pleuroparenchymal scarring. Scattered regions of subpleural reticulation noted throughout the lungs, most pronounced within the anterior upper lobes bilaterally. No focal airspace consolidation, pleural effusion, or pneumothorax. Posterior bibasilar dependent atelectasis. Musculoskeletal: No acute fracture or destructive bone lesion. Multilevel thoracic osteophytosis. Osteopenia. Upper Abdomen: No acute abnormality in the partially visualized upper abdomen. Review of the MIP images confirms the above findings. IMPRESSION: 1. No pulmonary embolism. 2. Scattered areas of subpleural reticulation noted, most pronounced within the upper lobes bilaterally. This may represent changes of a postinfectious fibrosis or developing interstitial lung disease. No honeycombing to suggest UIP pattern pulmonary fibrosis. No  superimposed pneumonia. 3. Scattered subcentimeter multistation mediastinal and bilateral hilar lymph nodes, likely reactive. 4. Small hiatal hernia. Aortic Atherosclerosis (ICD10-I70.0). Electronically Signed   By: Rogelia Myers M.D.   On: 07/22/2024 08:59   DG Chest 2 View Result Date: 07/22/2024 EXAM: 2 VIEW(S) XRAY OF THE CHEST 07/22/2024 06:47:43 AM COMPARISON: 07/16/2024 CLINICAL HISTORY: Chest pain /cough. FINDINGS: LUNGS AND PLEURA: No focal pulmonary opacity. No pulmonary edema. No pleural effusion. No pneumothorax. External foreign body is identified overlying the left upper lobe measuring 1.8 cm. There is diffuse bronchial wall thickening. No consolidative change. HEART AND MEDIASTINUM: No acute abnormality of the cardiac and mediastinal silhouettes. BONES AND SOFT TISSUES: No acute osseous abnormality. Unchanged mild compression deformities within the lower thoracic spine at the T10 and T11 level. Unchanged from 09/27/2023. IMPRESSION: 1. Diffuse bronchial wall thickening. No signs of pneumonia. Electronically signed by: Waddell Calk MD 07/22/2024 07:24 AM EDT RP Workstation: [id number]     Procedures   Medications Ordered in the ED  iohexol  (OMNIPAQUE ) 350 MG/ML injection 75 mL (75 mLs Intravenous Contrast Given 07/22/24 0828)    Clinical Course as of 07/22/24 0914  Fri Jul 22, 2024  0713 EKG reviewed by me: Normal sinus rhythm with leftward axis and normal intervals.  No evidence of acute ischemic change, heart block or dysrhythmia [AG]  0714 Chest x-ray reviewed by me: Trachea midline.  No evidence of pneumothorax, cardiomegaly, pleural effusion or pulmonary edema.  Left upper lobe focal consolidation not present on chest x-ray 07/16/2024 [AG]  0726 Wells 1 [AG]  9195 Evaluated.  Does not appear to be in distress.  Maintaining saturation on room air.  Some adventitious lung sounds.  Workup thus far largely reassuring.  However, given risk factors will get CTA.  If negative, likely  outpatient management [TY]    Clinical Course User Index [AG] Awad Gladd, DO [TY] Neysa Caron PARAS, DO                                 Medical Decision Making Amount and/or Complexity of Data Reviewed Labs: ordered. Radiology: ordered.  Risk Prescription drug management.   On initial evaluation patient is hemodynamically stable, afebrile, saturating well on room air and not in acute respiratory distress.  Based upon patient's history will obtain laboratory studies, chest x-ray initially.  Differential diagnosis includes pneumothorax, pneumonia, vasculitic changes resulting in fibrosis, pulmonary embolism, acute URI.  Patient Wells of 1 however with history of vasculitis will obtain CTA PE study to assess for pulmonary embolism as well as pulmonary pathology.  Less likely ACS/MI as patient endorses pleuritic chest pain, initial troponin within normal limits and EKG without acute ischemic findings.  Chest x-ray reviewed without evidence of pneumothorax or pneumonia however there is evidence of left upper lobe  consolidation.  CTA PE study without evidence of pulmonary embolism or pneumonia on imaging however there is evidence of pulmonary fibrosis/scarring.  As patient is afebrile without leukocytosis and no evidence of pneumonia I do not believe antibiotics are needed at this time.  Will give patient referral to pulmonology in the outpatient setting as well as albuterol  inhaler to be used as needed for shortness of breath, cough and wheezing.  Patient and patient's daughter were updated on plan of care and agreed with the plan of care without further questions.  Patient was given strict return precautions.     Final diagnoses:  Subacute cough    ED Discharge Orders          Ordered    albuterol  (VENTOLIN  HFA) 108 (90 Base) MCG/ACT inhaler  Every 4 hours PRN        07/22/24 0905    Ambulatory referral to Pulmonology        07/22/24 0907            Lavanda Bolster DO Emergency  Medicine PGY2    Bolster Lavanda, DO 07/22/24 0914    Neysa Caron PARAS, DO 07/22/24 1611

## 2024-07-22 NOTE — ED Notes (Signed)
 Pt ambulated to restroom without assistance.

## 2024-07-26 ENCOUNTER — Encounter (HOSPITAL_COMMUNITY): Payer: Self-pay

## 2024-07-26 ENCOUNTER — Ambulatory Visit (HOSPITAL_COMMUNITY)
Admission: EM | Admit: 2024-07-26 | Discharge: 2024-07-26 | Disposition: A | Discharge status: Home / Self Care | Attending: Family Medicine | Admitting: Family Medicine

## 2024-07-26 DIAGNOSIS — R053 Chronic cough: Secondary | ICD-10-CM

## 2024-07-26 DIAGNOSIS — R0602 Shortness of breath: Secondary | ICD-10-CM

## 2024-07-26 MED ORDER — ALBUTEROL SULFATE (2.5 MG/3ML) 0.083% IN NEBU
2.5000 mg | INHALATION_SOLUTION | Freq: Once | RESPIRATORY_TRACT | Status: AC
  Administered 2024-07-26: 2.5 mg via RESPIRATORY_TRACT

## 2024-07-26 MED ORDER — ALBUTEROL SULFATE (2.5 MG/3ML) 0.083% IN NEBU
INHALATION_SOLUTION | RESPIRATORY_TRACT | Status: AC
  Filled 2024-07-26: qty 3

## 2024-07-26 MED ORDER — ALBUTEROL SULFATE (2.5 MG/3ML) 0.083% IN NEBU: 2.5000 mg | INHALATION_SOLUTION | Freq: Four times a day (QID) | RESPIRATORY_TRACT | 3 refills | Status: DC | PRN

## 2024-07-26 NOTE — ED Triage Notes (Signed)
 Patient's daughter is interpreting for the patient.  Patient reports SOB and a productive cough with white sputum, and an intermittent fever x 1 month. Daughter states the patient has been to the ED once and UC  twice for these symptoms.

## 2024-07-26 NOTE — Discharge Instructions (Addendum)
??????? ????? ???? ??????? ??? ?????? ???????. ??????? ???? ????????? ?????. ??????? ???? ???? ????????? ???????? ?????? ?????? ??? ????????. ????? ??????? ????? ????? ????? ??????? ?? ????? ???????. ??? ????? ??? ?????? ????? ??? ??????? ????? ?????? ??? ??? ??????? ?????? ???? ?? ???????.   rajaeatuha alyawm bisabab astimrar diq altanafus walsieali. 'aetaytuha bikhakh 'albutirul alyawma. 'aetayatha katheryne apxyjxy liliastikhdam almanzili, wa'ursilat aldawa' 'iilaa alsaydaliati. yurja alaitisal bimaktab 'amrad alriyat liltasjil fi qayimat al'iilgha'i. 'iidhan tafaqam diq altanafus ladayha raghm alealaji, yurja altawajuh 'iilaa qism altawari li'iijra' mazid min altaqyimi.   She was seen today for continue shortness of breath and cough.  She was given an albuterol  nebulizer today.  I have given her a nebulizer machine for home use, and sent medication to the pharmacy.  Please call the Pulmonary office to get on a cancellation list.  If she has worsening shortness of breath despite treatment please go to the ER for further evaluation.

## 2024-07-26 NOTE — ED Provider Notes (Signed)
 [facility]    CSN: [id number] Arrival date & time: 07/26/24  1441      History   Chief Complaint Chief Complaint  Patient presents with   Cough   Shortness of Breath    HPI Amy Flowers is a 58 y.o. female.    Cough Associated symptoms: shortness of breath   Shortness of Breath Associated symptoms: cough    Patient is here for continued cough and sob.  She is feeling more tightening in her chest, feeling that she cannot breath.  She is not using any medications at this time.   Symptoms have been going on for over a month. This is her fourth visit for this, seen in the [facility] and ER.  Initially she was given azithromycin  and inhaler without much improvement.  She was then  last seen in the ER 4 days ago.  Chest xray was normal.   Lab work unremarkable.  CTA done, which showed evidence of pulmonary fibrosis/scarring.  Patient was given an inhaler, and referred to pulmonology for further treatment.  Her daughter states the first inhaler worked somewhat well. The 2nd inhaler seemed to cause her throat to feel swollen.    Past Medical History:  Diagnosis Date   Aortic atherosclerosis (HCC) 06/26/2017   GERD (gastroesophageal reflux disease) 02/14/2019   Hiatal hernia 02/14/2019   Vitamin D  deficiency 09/28/2017    Patient Active Problem List   Diagnosis Date Noted   Polyclonal gammopathy 05/30/2024   Anaphylaxis 09/28/2023   Vasculitis (HCC) 09/28/2023   Angioedema 09/28/2023   Hypokalemia 09/28/2023   Esophageal motility disorder 02/14/2019   GERD (gastroesophageal reflux disease) 02/14/2019   Hiatal hernia 02/14/2019   Dysphagia 02/09/2019   Dyspnea 02/09/2019   Vitamin D  deficiency 09/28/2017   Osteoarthritis of both knees 07/01/2017   Aortic atherosclerosis (HCC) 06/26/2017    Class: Diagnosis of    Past Surgical History:  Procedure Laterality Date   APPENDECTOMY     DIAPHRAGMATIC HERNIA REPAIR      OB History   No obstetric history on file.       Home Medications    Prior to Admission medications   Medication Sig Start Date End Date Taking? Authorizing Provider  acetaminophen  (TYLENOL ) 325 MG tablet Take 2 tablets (650 mg total) by mouth every 6 (six) hours as needed. 09/10/23   Johnie Flaming A, NP  albuterol  (VENTOLIN  HFA) 108 (90 Base) MCG/ACT inhaler Inhale 2 puffs into the lungs every 4 (four) hours as needed for wheezing or shortness of breath. 07/22/24   Nada Chroman, DO  azithromycin  (ZITHROMAX ) 250 MG tablet Take first 2 tablets together, then 1 every day until finished. 07/16/24   White, Elizabeth A, PA-C  carbamide peroxide (DEBROX) 6.5 % OTIC solution Place 5 drops into the left ear 2 (two) times daily. Patient not taking: Reported on 05/30/2024 10/14/23   Newlin, Enobong, MD  cetirizine  (ZYRTEC  ALLERGY ) 10 MG tablet Take 1 tablet (10 mg total) by mouth 2 (two) times daily as needed (allergies or hives). Patient not taking: Reported on 05/30/2024 11/24/23   Tobie Arleta SQUIBB, MD  cholecalciferol (VITAMIN D3) 25 MCG (1000 UNIT) tablet Take 1,000 Units by mouth daily.    [provider]  diphenhydrAMINE  (BENADRYL  ALLERGY ) 25 mg capsule Take 1 capsule (25 mg total) by mouth every 8 (eight) hours as needed. Allergic reaction Patient not taking: Reported on 05/30/2024 10/14/23   Newlin, Enobong, MD  EPINEPHrine  0.3 mg/0.3 mL IJ SOAJ injection Inject 0.3 mg  into the muscle as needed for anaphylaxis. Patient not taking: Reported on 05/30/2024 10/14/23   Newlin, Enobong, MD  famotidine  (PEPCID ) 20 MG tablet Take 1 tablet (20 mg total) by mouth 2 (two) times daily as needed (hives). Patient not taking: Reported on 05/30/2024 11/24/23   Tobie Arleta SQUIBB, MD  fluticasone  (FLONASE ) 50 MCG/ACT nasal spray Place 2 sprays into both nostrils daily. Patient not taking: Reported on 05/30/2024 11/24/23   Tobie Arleta SQUIBB, MD  guaiFENesin  (MUCINEX ) 600 MG 12 hr tablet Take 1 tablet (600 mg total) by mouth 2 (two) times daily. 07/13/24   Enedelia Dorna HERO, FNP  hydrocortisone  cream 1 % Apply topically 4 (four) times daily as needed for itching. Patient not taking: Reported on 05/30/2024 10/03/23   Danton Reyes DASEN, MD  loratadine  (CLARITIN ) 10 MG tablet Take 1 tablet (10 mg total) by mouth daily. Patient not taking: Reported on 05/30/2024 10/04/23   Danton Reyes DASEN, MD  promethazine -dextromethorphan (PROMETHAZINE -DM) 6.25-15 MG/5ML syrup Take 5 mLs by mouth at bedtime as needed for cough. 07/13/24   Enedelia Dorna HERO, FNP    Family History No family history on file.  Social History Social History   Tobacco Use   Smoking status: Never    Passive exposure: Never   Smokeless tobacco: Never  Vaping Use   Vaping status: Never Used  Substance Use Topics   Alcohol use: No   Drug use: No     Allergies   Cyclobenzaprine    Review of Systems Review of Systems  Constitutional:  Positive for fatigue.  HENT: Negative.    Respiratory:  Positive for cough and shortness of breath.   Cardiovascular: Negative.   Gastrointestinal: Negative.   Genitourinary: Negative.   Musculoskeletal: Negative.   Psychiatric/Behavioral: Negative.       Physical Exam Triage Vital Signs ED Triage Vitals  Encounter Vitals Group     BP      Girls Systolic BP Percentile      Girls Diastolic BP Percentile      Boys Systolic BP Percentile      Boys Diastolic BP Percentile      Pulse      Resp      Temp      Temp src      SpO2      Weight      Height      Head Circumference      Peak Flow      Pain Score      Pain Loc      Pain Education      Exclude from Growth Chart    No data found.  Updated Vital Signs There were no vitals taken for this visit.  Visual Acuity Right Eye Distance:   Left Eye Distance:   Bilateral Distance:    Right Eye Near:   Left Eye Near:    Bilateral Near:     Physical Exam Constitutional:      General: She is not in acute distress.    Appearance: She is well-developed and normal weight.  She is not ill-appearing or toxic-appearing.  Cardiovascular:     Rate and Rhythm: Normal rate and regular rhythm.  Pulmonary:     Effort: Pulmonary effort is normal.     Breath sounds: Examination of the right-lower field reveals wheezing. Examination of the left-lower field reveals wheezing. Wheezing present.  Musculoskeletal:     Cervical back: Normal range of motion and neck supple.  Skin:  General: Skin is warm.  Neurological:     General: No focal deficit present.     Mental Status: She is alert.  Psychiatric:        Mood and Affect: Mood normal.      UC Treatments / Results  Labs (all labs ordered are listed, but only abnormal results are displayed) Labs Reviewed - No data to display  EKG   Radiology No results found.  Procedures Procedures (including critical care time)  Medications Ordered in UC Medications  albuterol  (PROVENTIL ) (2.5 MG/3ML) 0.083% nebulizer solution 2.5 mg (2.5 mg Nebulization Given 07/26/24 1543)    Initial Impression / Assessment and Plan / UC Course  I have reviewed the triage vital signs and the nursing notes.  Pertinent labs & imaging results that were available during my care of the patient were reviewed by me and considered in my medical decision making (see chart for details).   Final Clinical Impressions(s) / UC Diagnoses   Final diagnoses:  Chronic cough  Shortness of breath     Discharge Instructions      ??????? ????? ???? ??????? ??? ?????? ???????. ??????? ???? ????????? ?????. ??????? ???? ???? ????????? ???????? ?????? ?????? ??? ????????. ????? ??????? ????? ????? ????? ??????? ?? ????? ???????. ??? ????? ??? ?????? ????? ??? ??????? ????? ?????? ??? ??? ??????? ?????? ???? ?? ???????. rajaeatuha alyawm bisabab astimrar diq altanafus walsieali. 'aetaytuha bikhakh 'albutirul alyawma. 'aetayatha katheryne apxyjxy liliastikhdam almanzili, wa'ursilat aldawa' 'iilaa alsaydaliati. yurja alaitisal bimaktab 'amrad alriyat  liltasjil fi qayimat al'iilgha'i. 'iidhan tafaqam diq altanafus ladayha raghm alealaji, yurja altawajuh 'iilaa qism altawari li'iijra' mazid min altaqyimi.   She was seen today for continue shortness of breath and cough.  She was given an albuterol  nebulizer today.  I have given her a nebulizer machine for home use, and sent medication to the pharmacy.  Please call the Pulmonary office to get on a cancellation list.  If she has worsening shortness of breath despite treatment please go to the ER for further evaluation.     ED Prescriptions     Medication Sig Dispense Auth. Provider   albuterol  (PROVENTIL ) (2.5 MG/3ML) 0.083% nebulizer solution Take 3 mLs (2.5 mg total) by nebulization every 6 (six) hours as needed for wheezing or shortness of breath. 75 mL Darral Longs, MD      PDMP not reviewed this encounter.   Darral Longs, MD 07/26/24 [phone]

## 2024-07-27 ENCOUNTER — Ambulatory Visit: Payer: Self-pay

## 2024-07-27 NOTE — Telephone Encounter (Signed)
 Requesting hosp f/u, no appts until 9/23. Pt denies worsening s/s. Needs appt.   Copied from CRM [id number]. Topic: Clinical - Red Word Triage >> Jul 27, 2024  1:45 PM Fonda T wrote: [facility] that prompted transfer to Nurse Triage: Received call from daughter, Latrelle, [place] verified, states mother was seen in [facility] hospital, on 07/26/24, and not doing any better, getting worse. Having increased symptoms, of tightness in chest, increased coughing, difficulty breathing, and fever, body feels warm to touch.

## 2024-07-27 NOTE — Telephone Encounter (Signed)
 Patient's Daughter advised to take mother to ED to be evaluated based on her s/s. Patient daughter voiced that they are not listening and are not helping them they only advised that she go to she her doctor.  Patient advised that there are no available appointment today. Patient going to [facility] today. Location of [facility] sent via mychart

## 2024-07-28 ENCOUNTER — Emergency Department (HOSPITAL_COMMUNITY)
Admission: EM | Admit: 2024-07-28 | Discharge: 2024-07-28 | Disposition: A | Discharge status: Home / Self Care | Payer: Self-pay | Attending: Emergency Medicine | Admitting: Emergency Medicine

## 2024-07-28 ENCOUNTER — Encounter (HOSPITAL_COMMUNITY): Payer: Self-pay | Admitting: Emergency Medicine

## 2024-07-28 ENCOUNTER — Emergency Department (HOSPITAL_COMMUNITY): Payer: Self-pay

## 2024-07-28 DIAGNOSIS — R052 Subacute cough: Secondary | ICD-10-CM

## 2024-07-28 DIAGNOSIS — J8410 Pulmonary fibrosis, unspecified: Secondary | ICD-10-CM | POA: Insufficient documentation

## 2024-07-28 LAB — CBC WITH DIFFERENTIAL/PLATELET
Abs Immature Granulocytes: 0.01 K/uL (ref 0.00–0.07)
Basophils Absolute: 0 K/uL (ref 0.0–0.1)
Basophils Relative: 1 %
Eosinophils Absolute: 1 K/uL — ABNORMAL HIGH (ref 0.0–0.5)
Eosinophils Relative: 23 %
HCT: 42.5 % (ref 36.0–46.0)
Hemoglobin: 14 g/dL (ref 12.0–15.0)
Immature Granulocytes: 0 %
Lymphocytes Relative: 18 %
Lymphs Abs: 0.8 K/uL (ref 0.7–4.0)
MCH: 30.8 pg (ref 26.0–34.0)
MCHC: 32.9 g/dL (ref 30.0–36.0)
MCV: 93.6 fL (ref 80.0–100.0)
Monocytes Absolute: 0.3 K/uL (ref 0.1–1.0)
Monocytes Relative: 6 %
Neutro Abs: 2.3 K/uL (ref 1.7–7.7)
Neutrophils Relative %: 52 %
Platelets: 218 K/uL (ref 150–400)
RBC: 4.54 MIL/uL (ref 3.87–5.11)
RDW: 12.5 % (ref 11.5–15.5)
WBC: 4.4 K/uL (ref 4.0–10.5)
nRBC: 0 % (ref 0.0–0.2)

## 2024-07-28 LAB — RESP PANEL BY RT-PCR (RSV, FLU A&B, COVID)  RVPGX2
Influenza A by PCR: NEGATIVE
Influenza B by PCR: NEGATIVE
Resp Syncytial Virus by PCR: NEGATIVE
SARS Coronavirus 2 by RT PCR: NEGATIVE

## 2024-07-28 LAB — BASIC METABOLIC PANEL WITH GFR
Anion gap: 12 (ref 5–15)
BUN: 6 mg/dL (ref 6–20)
CO2: 22 mmol/L (ref 22–32)
Calcium: 9.3 mg/dL (ref 8.9–10.3)
Chloride: 106 mmol/L (ref 98–111)
Creatinine, Ser: 0.65 mg/dL (ref 0.44–1.00)
GFR, Estimated: >60 mL/min (ref >60)
Glucose, Bld: 112 mg/dL — ABNORMAL HIGH (ref 70–99)
Potassium: 4.2 mmol/L (ref 3.5–5.1)
Sodium: 140 mmol/L (ref 135–145)

## 2024-07-28 MED ORDER — PREDNISONE 10 MG PO TABS: 40.0000 mg | ORAL_TABLET | Freq: Every day | ORAL | 0 refills | Status: AC

## 2024-07-28 MED ORDER — PREDNISONE 20 MG PO TABS
60.0000 mg | ORAL_TABLET | Freq: Once | ORAL | Status: AC
  Administered 2024-07-28: 60 mg via ORAL
  Filled 2024-07-28: qty 3

## 2024-07-28 NOTE — Discharge Instructions (Signed)
 You were seen today for cough. While you were here we monitored your vitals, preformed a physical exam, and labs and EKG. These were all reassuring and there is no indication for any further testing or intervention in the emergency department at this time.  Your symptoms are likely due to pulmonary fibrosis  Things to do:  - Follow up with your primary care provider within the next 1-2 weeks - Please take the prednisone  as prescribed for the next 4 days.  Follow-up with your PCP and pulmonology  Return to the emergency department if you have any new or worsening symptoms including worsening difficulty breathing, swelling in 1 leg or the other, chest pain, fevers, or if you have any other concerns.

## 2024-07-28 NOTE — ED Provider Notes (Signed)
 [place] EMERGENCY DEPARTMENT AT [facility] Provider Note  MDM   HPI/ROS:  Amy Flowers is a 58 y.o. female with a medical history as below who presents with persistent cough for 1 month.  Daughter at bedside provides most of the history.  She reports that she has been seen multiple times for this Enovid given her albuterol  inhaler with only helps sometimes.  She is using it every 6 hours.  Denies any fevers, chills.  Cough is nonproductive with no sputum.  She does endorse wheezing tends to be worse at night.  Denies any chest pain, leg swelling but does endorse some exertional dyspnea.  Physical exam is notable for: - Mild diffuse wheezing.  No increased work of breathing, no supplemental O2 requirement  On my initial evaluation, patient is:  -Vital signs stable. Patient afebrile, hemodynamically stable, and non-toxic appearing. -Additional history obtained from daughter, chart review  Differentials include IPF, pneumonia, viral, PE, asthma, COPD, ACS, dissection.    On exam patient does have mild diffuse end expiratory wheezing.  She is saturating well and is not requiring supplemental O2 at this time.  She has no significant dyspnea but does have a profuse cough.  On review of chart she had a recent CT scan negative for PE and her symptoms are relatively unchanged with makes PE unlikely.  That she did have findings of pulmonary fibrosis which is the leading differential at this time.  CXR as below without pneumonia.  She has no history of asthma or COPD.  She is not having any chest pain and   Doubt dissection.  No lower extremity edema to suggest heart failure.  She has been on albuterol  and DuoNebs with some reprieve however continued return of symptoms which is consistent with pulmonary fibrosis.  I do believe steroids are reasonable given her persistent symptoms.  I placed a referral for pulmonology outpatient.  She was given prednisone  and will be prescribed the same at  discharge.  Discharged in stable condition with close follow-up and strict return precautions.  Interpretations, interventions, and the patient's course of care are documented below.    Clinical Course as of 07/28/24 1528  Thu Jul 28, 2024  1419 BMP unremarkable [RC]  1426 DG Chest 2 View No overt pneumonia or inflammatory change [RC]  1450 Resp panel by RT-PCR (RSV, Flu A&B, Covid) Anterior Nasal Swab Negative viral swabs [RC]  1527 S. Pulm fibrosis w no worsening. CC pulm can't see her for another month. Persistent cough. Pred 40 at DC. Pending labs and EKG [  ] [AG]    Clinical Course User Index [RC] Sharyne Darina RAMAN, MD      Disposition:  I discussed the plan for discharge with the patient and/or their surrogate at bedside prior to discharge and they were in agreement with the plan and verbalized understanding of the return precautions provided. All questions answered to the best of my ability. Ultimately, the patient was discharged in stable condition with stable vital signs. I am reassured that they are capable of close follow up and good social support at home.   Clinical Impression:  1. Pulmonary fibrosis (HCC)   2. Subacute cough     Rx / DC Orders ED Discharge Orders          Ordered    Ambulatory referral to Pulmonology        07/28/24 1436    predniSONE  (DELTASONE ) 10 MG tablet  Daily  07/28/24 1524            The plan for this patient was discussed with Dr. Darra, who voiced agreement and who oversaw evaluation and treatment of this patient.   Clinical Complexity A medically appropriate history, review of systems, and physical exam was performed.  My independent interpretations of EKG, labs, and radiology are documented in the ED course above.   If decision rules were used in this patient's evaluation, they are listed below.   Click here for ABCD2, HEART and other calculatorsREFRESH Note before signing   Patient's presentation is most consistent  with acute presentation with potential threat to life or bodily function.  Medical Decision Making Amount and/or Complexity of Data Reviewed Labs: ordered. Decision-making details documented in ED Course. Radiology: ordered. Decision-making details documented in ED Course.  Risk Prescription drug management.    HPI/ROS      See MDM section for pertinent HPI and ROS. A complete ROS was performed with pertinent positives/negatives noted above.   Past Medical History:  Diagnosis Date   Aortic atherosclerosis (HCC) 06/26/2017   GERD (gastroesophageal reflux disease) 02/14/2019   Hiatal hernia 02/14/2019   Vitamin D  deficiency 09/28/2017    Past Surgical History:  Procedure Laterality Date   APPENDECTOMY     DIAPHRAGMATIC HERNIA REPAIR        Physical Exam   Vitals:   07/28/24 1130 07/28/24 1141 07/28/24 1513  BP: 128/67  (!) 147/82  Pulse: 90  85  Resp: 20  18  Temp: 98.7 F (37.1 C)  98.9 F (37.2 C)  SpO2: 96%  97%  Weight:  65 kg   Height:  5' (1.524 m)     Physical Exam Vitals and nursing note reviewed.  Constitutional:      General: She is not in acute distress.    Appearance: She is well-developed.  HENT:     Head: Normocephalic and atraumatic.     Mouth/Throat:     Mouth: No angioedema.     Comments: No oropharyngeal swelling Eyes:     Conjunctiva/sclera: Conjunctivae normal.  Cardiovascular:     Rate and Rhythm: Normal rate and regular rhythm.     Heart sounds: No murmur heard. Pulmonary:     Effort: Pulmonary effort is normal. No tachypnea or respiratory distress.     Breath sounds: No stridor. Examination of the right-upper field reveals wheezing. Examination of the left-upper field reveals wheezing. Examination of the right-middle field reveals wheezing. Examination of the left-middle field reveals wheezing. Examination of the right-lower field reveals wheezing. Examination of the left-lower field reveals wheezing. Wheezing present.  Abdominal:      Palpations: Abdomen is soft.     Tenderness: There is no abdominal tenderness.  Musculoskeletal:        General: No swelling.     Cervical back: Neck supple.  Skin:    General: Skin is warm and dry.     Capillary Refill: Capillary refill takes less than 2 seconds.  Neurological:     Mental Status: She is alert.  Psychiatric:        Mood and Affect: Mood normal.      Procedures   If procedures were preformed on this patient, they are listed below:  Procedures   @BBSIG @   Please note that this documentation was produced with the assistance of voice-to-text technology and may contain errors.    Sharyne Darina RAMAN, MD 07/28/24 1530    Darra Fonda MATSU, MD 07/29/24 [phone]

## 2024-07-28 NOTE — ED Provider Notes (Signed)
 Patient received from prior team at handoff.  Please review prior providers notes for full HPI and physical examination.  58 year old female past medical history significant for pulmonary fibrosis who is presenting to the emergency department with inability to see pulmonology for another month with persistent cough however no acute changes in cough.  Plan at handoff: -Follow-up CBC -Follow-up EKG -If CBC and EKG within normal limits patient may be discharged with outpatient PCP and pulmonology follow-up   Physical Exam  BP (!) 147/82   Pulse 85   Temp 98.9 F (37.2 C)   Resp 18   Ht 5' (1.524 m)   Wt 65 kg   SpO2 97%   BMI 27.99 kg/m   Physical Exam Vitals reviewed.  Constitutional:      General: She is not in acute distress. Cardiovascular:     Rate and Rhythm: Normal rate.  Pulmonary:     Effort: Pulmonary effort is normal. No tachypnea or respiratory distress.  Musculoskeletal:     Comments: Spontaneous movement of bilateral upper and lower extremities  Neurological:     Mental Status: She is alert.     Gait: Gait is intact.  Psychiatric:        Behavior: Behavior is cooperative.     Procedures  Procedures  ED Course / MDM   Clinical Course as of 07/28/24 1751  Thu Jul 28, 2024  1419 BMP unremarkable [RC]  1426 DG Chest 2 View No overt pneumonia or inflammatory change [RC]  1450 Resp panel by RT-PCR (RSV, Flu A&B, Covid) Anterior Nasal Swab Negative viral swabs [RC]  1527 S. Pulm fibrosis w no worsening. CC pulm can't see her for another month. Persistent cough. Pred 40 at DC. Pending labs and EKG [  ] [AG]  1545 EKG reviewed by me: Normal sinus rhythm with normal axis and intervals.  Nonspecific T wave inversions in lead V3.  No evidence of acute ischemia [AG]    Clinical Course User Index [AG] Nada Chroman, DO [RC] Sharyne Darina RAMAN, MD   Medical Decision Making Amount and/or Complexity of Data Reviewed Labs: ordered. Decision-making details  documented in ED Course. Radiology: ordered. Decision-making details documented in ED Course.  Risk Prescription drug management.   On evaluation of the patient she is hemodynamically stable, afebrile and not in acute general distress or respiratory distress.  CBC obtained without evidence of infection, anemia or other abnormalities.  EKG obtained and reviewed in ED course without evidence of acute changes.  At this time believe patient is safe to be discharged with PCP and pulmonology follow-up.  Chroman Nada DO Emergency Medicine PGY2      Nada Chroman, DO 07/28/24 1751    Dreama Longs, MD 07/29/24 [phone]

## 2024-07-28 NOTE — ED Triage Notes (Signed)
 Family reports mother has been coughing for a month. States they have been seen multiple times and given inhaler but no improvement. Coughing up white sputum. Labs and CTA done.

## 2024-07-29 ENCOUNTER — Telehealth: Payer: Self-pay

## 2024-07-29 NOTE — Telephone Encounter (Signed)
 Copied from CRM #[id number]. Topic: Appointments - Appointment Scheduling >> Jul 28, 2024  3:04 PM Charlet HERO wrote: Patient/patient representative is calling to schedule an appointment. Refer to attachments for appointment information.  Patient needs a appt within 14 days this is a hospital followup, please call and make an appt with the patient

## 2024-08-09 ENCOUNTER — Inpatient Hospital Stay: Admitting: Family Medicine

## 2024-08-10 ENCOUNTER — Telehealth: Payer: Self-pay | Admitting: Family Medicine

## 2024-08-10 NOTE — Telephone Encounter (Signed)
 Pt confirmed appt (per vr) 9/2

## 2024-08-11 ENCOUNTER — Ambulatory Visit: Payer: Self-pay | Attending: Family Medicine | Admitting: Family Medicine

## 2024-08-11 ENCOUNTER — Other Ambulatory Visit: Payer: Self-pay

## 2024-08-11 ENCOUNTER — Encounter: Payer: Self-pay | Admitting: Family Medicine

## 2024-08-11 VITALS — BP 109/71 | HR 70 | Ht 60.0 in | Wt 144.0 lb

## 2024-08-11 DIAGNOSIS — M25512 Pain in left shoulder: Secondary | ICD-10-CM

## 2024-08-11 DIAGNOSIS — Z13220 Encounter for screening for lipoid disorders: Secondary | ICD-10-CM

## 2024-08-11 DIAGNOSIS — R7303 Prediabetes: Secondary | ICD-10-CM

## 2024-08-11 DIAGNOSIS — J8410 Pulmonary fibrosis, unspecified: Secondary | ICD-10-CM

## 2024-08-11 DIAGNOSIS — R0789 Other chest pain: Secondary | ICD-10-CM

## 2024-08-11 DIAGNOSIS — Z136 Encounter for screening for cardiovascular disorders: Secondary | ICD-10-CM

## 2024-08-11 DIAGNOSIS — M25511 Pain in right shoulder: Secondary | ICD-10-CM

## 2024-08-11 DIAGNOSIS — G8929 Other chronic pain: Secondary | ICD-10-CM

## 2024-08-11 LAB — POCT GLYCOSYLATED HEMOGLOBIN (HGB A1C): Hemoglobin A1C: 5.6 % (ref 4.0–5.6)

## 2024-08-11 MED ORDER — TIZANIDINE HCL 4 MG PO TABS
4.0000 mg | ORAL_TABLET | Freq: Three times a day (TID) | ORAL | 1 refills | Status: DC | PRN
  Filled 2024-08-11: qty 60, 20d supply, fill #0

## 2024-08-11 MED ORDER — PREDNISONE 5 MG PO TABS
5.0000 mg | ORAL_TABLET | Freq: Every day | ORAL | 0 refills | Status: DC
  Filled 2024-08-11: qty 7, 7d supply, fill #0

## 2024-08-11 MED ORDER — BENZONATATE 100 MG PO CAPS
100.0000 mg | ORAL_CAPSULE | Freq: Two times a day (BID) | ORAL | 0 refills | Status: DC | PRN
  Filled 2024-08-11: qty 20, 10d supply, fill #0

## 2024-08-11 NOTE — Patient Instructions (Signed)
 VISIT SUMMARY:  Today, you were seen for a persistent cough and shoulder pain. We discussed your ongoing symptoms and reviewed your current medications. We also addressed concerns about prediabetes and your shoulder pain.  YOUR PLAN:  -PULMONARY FIBROSIS WITH PERSISTENT COUGH: Pulmonary fibrosis is a lung disease that causes scarring in the lungs, leading to persistent cough and difficulty breathing. We will start you on prednisone  5 mg daily for 7 days to help manage your symptoms. You should continue using your nebulizer as it provides relief. Additionally, you have been prescribed Tessalon  capsules to take twice daily as needed for your cough. Please keep your appointment with the pulmonologist on October 7th.  -PREDIABETES: Prediabetes means your blood sugar levels are higher than normal but not high enough to be classified as diabetes. This condition increases your risk of developing diabetes, especially with prednisone  use. We have ordered an A1c test today to monitor your blood sugar levels.  -MUSCULOSKELETAL PAIN OF LEFT SHOULDER, BACK, AND CHEST: Musculoskeletal pain refers to pain in the muscles, bones, or joints. Your pain is likely due to this and not related to your heart, as your EKG was normal. You have been prescribed tizanidine  4 mg to take every 8 hours as needed for pain relief. You can also take Aleve  or ibuprofen  for bone pain. If you experience persistent chest pain or shortness of breath, please go to the ER immediately.  INSTRUCTIONS:  Please follow up with your pulmonologist on October 7th. Additionally, get your A1c test done today to monitor your blood sugar levels.

## 2024-08-11 NOTE — Progress Notes (Signed)
 Subjective:  Patient ID: Amy Flowers, female    DOB: 06/28/1966  Age: 58 y.o. MRN: [id number]  CC: Hospitalization Follow-up     Discussed the use of AI scribe software for clinical note transcription with the patient, who gave verbal consent to proceed.  History of Present Illness Amy Flowers is a 58 year old female with pulmonary fibrosis who presents with persistent cough and shoulder pain accompanied by her daughter and grandson.  She has had a persistent productive cough, sometimes accompanied by difficulty breathing and wheezing, especially during intense coughing episodes. She uses a nebulizer for relief and has run out of prednisone , which previously increased her appetite.  She was referred to pulmonary but her appointment does not come up till 1 month.  She had an ED visit for same 2 weeks ago.  CT angio chest revealed: IMPRESSION: 1. No pulmonary embolism. 2. Scattered areas of subpleural reticulation noted, most pronounced within the upper lobes bilaterally. This may represent changes of a postinfectious fibrosis or developing interstitial lung disease. No honeycombing to suggest UIP pattern pulmonary fibrosis. No superimposed pneumonia. 3. Scattered subcentimeter multistation mediastinal and bilateral hilar lymph nodes, likely reactive. 4. Small hiatal hernia.   She experiences left shoulder pain that worsens with movement and pressure, radiating to the back and anterior shoulder including left anterior chest.  On further questioning she also endorses presence of right shoulder pain. The duration of this pain is unspecified.  Her current medications include a nebulizer and a breathing support machine, which she finds more effective than an inhaler. There is a concern for prediabetes due to prednisone  use, with her last A1c checked in December 2023 at 6.0 but A1c today has decreased to 5.6.    Past Medical History:  Diagnosis Date   Aortic atherosclerosis (HCC)  06/26/2017   GERD (gastroesophageal reflux disease) 02/14/2019   Hiatal hernia 02/14/2019   Vitamin D  deficiency 09/28/2017    Past Surgical History:  Procedure Laterality Date   APPENDECTOMY     DIAPHRAGMATIC HERNIA REPAIR      No family history on file.  Social History   Socioeconomic History   Marital status: Single    Spouse name: Not on file   Number of children: Not on file   Years of education: Not on file   Highest education level: 12th grade  Occupational History   Not on file  Tobacco Use   Smoking status: Never    Passive exposure: Never   Smokeless tobacco: Never  Vaping Use   Vaping status: Never Used  Substance and Sexual Activity   Alcohol use: No   Drug use: No   Sexual activity: Not on file  Other Topics Concern   Not on file  Social History Narrative   Not on file   Social Drivers of Health   Financial Resource Strain: Low Risk  (11/24/2023)   Overall Financial Resource Strain (CARDIA)    Difficulty of Paying Living Expenses: Not hard at all  Food Insecurity: No Food Insecurity (11/24/2023)   Hunger Vital Sign    Worried About Running Out of Food in the Last Year: Never true    Ran Out of Food in the Last Year: Never true  Transportation Needs: No Transportation Needs (11/24/2023)   PRAPARE - Administrator, Civil Service (Medical): No    Lack of Transportation (Non-Medical): No  Physical Activity: Sufficiently Active (11/24/2023)   Exercise Vital Sign    Days of Exercise per Week: 5  days    Minutes of Exercise per Session: 150+ min  Recent Concern: Physical Activity - Insufficiently Active (10/14/2023)   Exercise Vital Sign    Days of Exercise per Week: 3 days    Minutes of Exercise per Session: 30 min  Stress: No Stress Concern Present (11/24/2023)   [employer] of Occupational Health - Occupational Stress Questionnaire    Feeling of Stress : Not at all  Social Connections: Socially Isolated (11/24/2023)   Social  Connection and Isolation Panel    Frequency of Communication with Friends and Family: Twice a week    Frequency of Social Gatherings with Friends and Family: Twice a week    Attends Religious Services: Never    Database administrator or Organizations: No    Attends Banker Meetings: Never    Marital Status: Widowed    Allergies  Allergen Reactions   Cyclobenzaprine  Hives and Itching    Outpatient Medications Prior to Visit  Medication Sig Dispense Refill   albuterol  (PROVENTIL ) (2.5 MG/3ML) 0.083% nebulizer solution Take 3 mLs (2.5 mg total) by nebulization every 6 (six) hours as needed for wheezing or shortness of breath. 75 mL 3   cholecalciferol (VITAMIN D3) 25 MCG (1000 UNIT) tablet Take 1,000 Units by mouth daily.     acetaminophen  (TYLENOL ) 325 MG tablet Take 2 tablets (650 mg total) by mouth every 6 (six) hours as needed. (Patient not taking: Reported on 08/11/2024) 30 tablet 0   guaiFENesin  (MUCINEX ) 600 MG 12 hr tablet Take 1 tablet (600 mg total) by mouth 2 (two) times daily. (Patient not taking: Reported on 08/11/2024) 30 tablet 0   promethazine -dextromethorphan (PROMETHAZINE -DM) 6.25-15 MG/5ML syrup Take 5 mLs by mouth at bedtime as needed for cough. (Patient not taking: Reported on 08/11/2024) 118 mL 0   No facility-administered medications prior to visit.     ROS Review of Systems  Constitutional:  Negative for activity change and appetite change.  HENT:  Negative for sinus pressure and sore throat.   Respiratory:  Positive for cough. Negative for chest tightness, shortness of breath and wheezing.   Cardiovascular:  Negative for chest pain and palpitations.  Gastrointestinal:  Negative for abdominal distention, abdominal pain and constipation.  Genitourinary: Negative.   Musculoskeletal:        See HPI  Psychiatric/Behavioral:  Negative for behavioral problems and dysphoric mood.     Objective:  BP 109/71   Pulse 70   Ht 5' (1.524 m)   Wt 144 lb (65.3  kg)   SpO2 97%   BMI 28.12 kg/m      08/11/2024    9:06 AM 07/28/2024    6:07 PM 07/28/2024    3:13 PM  BP/Weight  Systolic BP 109 143 147  Diastolic BP 71 73 82  Wt. (Lbs) 144    BMI 28.12 kg/m2        Physical Exam Constitutional:      Appearance: She is well-developed.  Cardiovascular:     Rate and Rhythm: Normal rate.     Heart sounds: Normal heart sounds. No murmur heard. Pulmonary:     Effort: Pulmonary effort is normal.     Breath sounds: Normal breath sounds. No wheezing or rales.  Chest:     Chest wall: No tenderness.  Abdominal:     General: Bowel sounds are normal. There is no distension.     Palpations: Abdomen is soft. There is no mass.     Tenderness: There is no abdominal  tenderness.  Musculoskeletal:     Right lower leg: No edema.     Left lower leg: No edema.     Comments: On full abduction of left upper extremity there is associated tenderness in trapezius muscle, no tenderness elicited on the right.  Normal range of motion is achievable in both upper extremity. Negative Hawkin and Neer signs.  Neurological:     Mental Status: She is alert and oriented to person, place, and time.     Comments: Normal handgrip bilaterally  Psychiatric:        Mood and Affect: Mood normal.        Latest Ref Rng & Units 07/28/2024   11:44 AM 07/22/2024    6:26 AM 11/24/2023   11:36 AM  CMP  Glucose 70 - 99 mg/dL 887  893    BUN 6 - 20 mg/dL 6  6    Creatinine 9.55 - 1.00 mg/dL 9.34  9.28    Sodium 864 - 145 mmol/L 140  140    Potassium 3.5 - 5.1 mmol/L 4.2  3.5    Chloride 98 - 111 mmol/L 106  104    CO2 22 - 32 mmol/L 22  25    Calcium 8.9 - 10.3 mg/dL 9.3  9.6    Total Protein 6.0 - 8.5 g/dL   7.1     Lipid Panel     Component Value Date/Time   CHOL 162 02/26/2022 0845   TRIG 73 02/26/2022 0845   HDL 49 02/26/2022 0845   LDLCALC 99 02/26/2022 0845    CBC    Component Value Date/Time   WBC 4.4 07/28/2024 1726   RBC 4.54 07/28/2024 1726   HGB 14.0  07/28/2024 1726   HGB 14.1 02/26/2022 0845   HCT 42.5 07/28/2024 1726   HCT 40.4 02/26/2022 0845   PLT 218 07/28/2024 1726   PLT 187 02/26/2022 0845   MCV 93.6 07/28/2024 1726   MCV 93 02/26/2022 0845   MCH 30.8 07/28/2024 1726   MCHC 32.9 07/28/2024 1726   RDW 12.5 07/28/2024 1726   RDW 11.9 02/26/2022 0845   LYMPHSABS 0.8 07/28/2024 1726   LYMPHSABS 1.8 02/26/2022 0845   MONOABS 0.3 07/28/2024 1726   EOSABS 1.0 (H) 07/28/2024 1726   EOSABS 0.2 02/26/2022 0845   BASOSABS 0.0 07/28/2024 1726   BASOSABS 0.0 02/26/2022 0845    Lab Results  Component Value Date   HGBA1C 5.6 08/11/2024    Lab Results  Component Value Date   HGBA1C 5.6 08/11/2024   HGBA1C 6.0 (H) 02/26/2022       Assessment & Plan Pulmonary fibrosis with persistent cough Persistent cough with phlegm due to pulmonary fibrosis. Short-term prednisone  planned. Discussed prednisone  side effects, including increased appetite and diabetes risk. Nebulizer preferred for symptom management. - Prescribe prednisone  5 mg daily for 7 days. - Prescribe Tessalon  capsules, 20 capsules, take one by mouth twice daily as needed. - Continue nebulizer use. - Keep pulmonologist appointment on October 7th.  Prediabetes Risk of diabetes due to prednisone . Last A1c in December 2023. Need to monitor blood glucose. - Order A1c test today is 5.6 down from 6.0 previously  Musculoskeletal chest pain/bilateral shoulder pain Pain likely musculoskeletal. EKG with no concern for ischemia, not cardiac. - Prescribe tizanidine  4 mg every 8 hours as needed. - Advise Aleve  or ibuprofen  for bone pain. - Instruct to go to ER if persistent chest pain or shortness of breath develops.   Encounter for lipid screening for cardiovascular  disease Lipid panel ordered    Meds ordered this encounter  Medications   predniSONE  (DELTASONE ) 5 MG tablet    Sig: Take 1 tablet (5 mg total) by mouth daily with breakfast.    Dispense:  7 tablet     Refill:  0   benzonatate  (TESSALON ) 100 MG capsule    Sig: Take 1 capsule (100 mg total) by mouth 2 (two) times daily as needed for cough.    Dispense:  20 capsule    Refill:  0   tiZANidine  (ZANAFLEX ) 4 MG tablet    Sig: Take 1 tablet (4 mg total) by mouth every 8 (eight) hours as needed.    Dispense:  60 tablet    Refill:  1    Follow-up: Return in about 6 months (around 02/08/2025) for Chronic medical conditions.       Corrina Sabin, MD, FAAFP. [facility] [place], [place] [phone]   08/11/2024, 9:59 AM

## 2024-08-12 ENCOUNTER — Ambulatory Visit: Payer: Self-pay | Admitting: Family Medicine

## 2024-08-12 LAB — LP+NON-HDL CHOLESTEROL
Cholesterol, Total: 181 mg/dL (ref 100–199)
HDL: 46 mg/dL (ref >39)
LDL Chol Calc (NIH): 118 mg/dL — ABNORMAL HIGH (ref 0–99)
Total Non-HDL-Chol (LDL+VLDL): 135 mg/dL — ABNORMAL HIGH (ref 0–129)
Triglycerides: 92 mg/dL (ref 0–149)
VLDL Cholesterol Cal: 17 mg/dL (ref 5–40)

## 2024-09-05 ENCOUNTER — Inpatient Hospital Stay: Payer: Self-pay | Admitting: Family Medicine

## 2024-09-06 ENCOUNTER — Emergency Department (HOSPITAL_BASED_OUTPATIENT_CLINIC_OR_DEPARTMENT_OTHER): Payer: Self-pay | Admitting: Radiology

## 2024-09-06 ENCOUNTER — Emergency Department (HOSPITAL_BASED_OUTPATIENT_CLINIC_OR_DEPARTMENT_OTHER)
Admission: EM | Admit: 2024-09-06 | Discharge: 2024-09-07 | Disposition: A | Discharge status: Home / Self Care | Payer: Self-pay | Attending: Emergency Medicine | Admitting: Emergency Medicine

## 2024-09-06 ENCOUNTER — Emergency Department (HOSPITAL_BASED_OUTPATIENT_CLINIC_OR_DEPARTMENT_OTHER): Payer: Self-pay

## 2024-09-06 ENCOUNTER — Other Ambulatory Visit: Payer: Self-pay

## 2024-09-06 DIAGNOSIS — R062 Wheezing: Secondary | ICD-10-CM | POA: Insufficient documentation

## 2024-09-06 DIAGNOSIS — J8410 Pulmonary fibrosis, unspecified: Secondary | ICD-10-CM

## 2024-09-06 DIAGNOSIS — R55 Syncope and collapse: Secondary | ICD-10-CM | POA: Insufficient documentation

## 2024-09-06 DIAGNOSIS — R0600 Dyspnea, unspecified: Secondary | ICD-10-CM

## 2024-09-06 DIAGNOSIS — R079 Chest pain, unspecified: Secondary | ICD-10-CM | POA: Insufficient documentation

## 2024-09-06 LAB — CBC
HCT: 42.5 % (ref 36.0–46.0)
Hemoglobin: 13.9 g/dL (ref 12.0–15.0)
MCH: 30.5 pg (ref 26.0–34.0)
MCHC: 32.7 g/dL (ref 30.0–36.0)
MCV: 93.4 fL (ref 80.0–100.0)
Platelets: 204 K/uL (ref 150–400)
RBC: 4.55 MIL/uL (ref 3.87–5.11)
RDW: 12.8 % (ref 11.5–15.5)
WBC: 4.8 K/uL (ref 4.0–10.5)
nRBC: 0 % (ref 0.0–0.2)

## 2024-09-06 LAB — BASIC METABOLIC PANEL WITH GFR
Anion gap: 13 (ref 5–15)
BUN: 9 mg/dL (ref 6–20)
CO2: 26 mmol/L (ref 22–32)
Calcium: 9.9 mg/dL (ref 8.9–10.3)
Chloride: 99 mmol/L (ref 98–111)
Creatinine, Ser: 0.85 mg/dL (ref 0.44–1.00)
GFR, Estimated: >60 mL/min (ref >60)
Glucose, Bld: 99 mg/dL (ref 70–99)
Potassium: 4 mmol/L (ref 3.5–5.1)
Sodium: 138 mmol/L (ref 135–145)

## 2024-09-06 LAB — TROPONIN T, HIGH SENSITIVITY
Troponin T High Sensitivity: <15 ng/L (ref 0–19)
Troponin T High Sensitivity: <15 ng/L (ref 0–19)

## 2024-09-06 LAB — D-DIMER, QUANTITATIVE: D-Dimer, Quant: 2.18 ug{FEU}/mL — ABNORMAL HIGH (ref 0.00–0.50)

## 2024-09-06 MED ORDER — METHYLPREDNISOLONE 4 MG PO TBPK
ORAL_TABLET | ORAL | 0 refills | Status: DC
  Filled 2024-09-06 – 2024-09-07 (×2): qty 21, 6d supply, fill #0

## 2024-09-06 MED ORDER — IOHEXOL 350 MG/ML SOLN
75.0000 mL | Freq: Once | INTRAVENOUS | Status: AC | PRN
  Administered 2024-09-06: 75 mL via INTRAVENOUS

## 2024-09-06 MED ORDER — AZITHROMYCIN 250 MG PO TABS
250.0000 mg | ORAL_TABLET | Freq: Every day | ORAL | 0 refills | Status: DC
  Filled 2024-09-06: qty 6, 5d supply, fill #0

## 2024-09-06 MED ORDER — IPRATROPIUM-ALBUTEROL 0.5-2.5 (3) MG/3ML IN SOLN
3.0000 mL | Freq: Once | RESPIRATORY_TRACT | Status: AC
  Administered 2024-09-06: 3 mL via RESPIRATORY_TRACT
  Filled 2024-09-06: qty 3

## 2024-09-06 MED ORDER — METHYLPREDNISOLONE SODIUM SUCC 125 MG IJ SOLR
125.0000 mg | Freq: Once | INTRAMUSCULAR | Status: AC
  Administered 2024-09-06: 125 mg via INTRAVENOUS
  Filled 2024-09-06: qty 2

## 2024-09-06 NOTE — ED Provider Notes (Signed)
 [place] EMERGENCY DEPARTMENT AT [facility] Provider Note  CSN: [id number] Arrival date & time: 09/06/24 2024  Chief Complaint(s) Chest Pain and Loss of Consciousness  HPI Amy Flowers is a 58 y.o. female who is here today for 3 days of of chest pain, shortness of breath.  Patient with history of pulmonary fibrosis, reportedly had a syncopal episode today.  Patient here today with her son-in-law.  I offered Arabic interpreter to the patient multiple times, however she preferred to have her son-in-law interpret.  Patient denies any fever, chills.  She is not regularly taking any inhalers at home.  She is not currently having any chest pain.   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 Authorizing Provider  acetaminophen  (TYLENOL ) 325 MG tablet Take 2 tablets (650 mg total) by mouth every 6 (six) hours as needed. Patient not taking: Reported on 08/11/2024 09/10/23   Johnie Flaming A, NP  albuterol  (PROVENTIL ) (2.5 MG/3ML) 0.083% nebulizer solution Take 3 mLs (2.5 mg total) by nebulization every 6 (six) hours as needed for wheezing or shortness of breath. 07/26/24   Piontek, Rocky, MD  benzonatate  (TESSALON ) 100 MG capsule Take 1  capsule (100 mg total) by mouth 2 (two) times daily as needed for cough. 08/11/24   Newlin, Enobong, MD  cholecalciferol (VITAMIN D3) 25 MCG (1000 UNIT) tablet Take 1,000 Units by mouth daily.    [provider]  guaiFENesin  (MUCINEX ) 600 MG 12 hr tablet Take 1 tablet (600 mg total) by mouth 2 (two) times daily. Patient not taking: Reported on 08/11/2024 07/13/24   Enedelia Dorna HERO, FNP  predniSONE  (DELTASONE ) 5 MG tablet Take 1 tablet (5 mg total) by mouth daily with breakfast. 08/11/24   Newlin, Enobong, MD  promethazine -dextromethorphan (PROMETHAZINE -DM) 6.25-15 MG/5ML syrup Take 5 mLs by mouth at bedtime as needed for cough. Patient not taking: Reported on 08/11/2024 07/13/24   Enedelia Dorna HERO, FNP  tiZANidine  (ZANAFLEX ) 4 MG tablet Take 1 tablet (4 mg total) by mouth every 8 (eight) hours as needed. 08/11/24   Newlin, Enobong, MD  Past Surgical History Past Surgical History:  Procedure Laterality Date   APPENDECTOMY     DIAPHRAGMATIC HERNIA REPAIR     Family History No family history on file.  Social History Social History   Tobacco Use   Smoking status: Never    Passive exposure: Never   Smokeless tobacco: Never  Vaping Use   Vaping status: Never Used  Substance Use Topics   Alcohol use: No   Drug use: No   Allergies Cyclobenzaprine   Review of Systems Review of Systems  Physical Exam Vital Signs  I have reviewed the triage vital signs BP 136/70   Pulse 85   Temp 98.2 F (36.8 C)   Resp (!) 25   SpO2 98%   Physical Exam Vitals and nursing note reviewed.  Constitutional:      Appearance: She is not ill-appearing or toxic-appearing.  Cardiovascular:     Rate and Rhythm: Normal rate.     Heart sounds: Normal heart sounds.  Pulmonary:     Breath sounds: Wheezing present.  Chest:     Chest wall: No mass or tenderness.   Musculoskeletal:        General: Normal range of motion.  Neurological:     General: No focal deficit present.     Mental Status: She is alert.     ED Results and Treatments Labs (all labs ordered are listed, but only abnormal results are displayed) Labs Reviewed  D-DIMER, QUANTITATIVE - Abnormal; Notable for the following components:      Result Value   D-Dimer, Quant 2.18 (*)    All other components within normal limits  BASIC METABOLIC PANEL WITH GFR  CBC  TROPONIN T, HIGH SENSITIVITY  TROPONIN T, HIGH SENSITIVITY                                                                                                                          Radiology DG Chest 2 View Result Date: 09/06/2024 CLINICAL DATA:  Chest pain. EXAM: CHEST - 2 VIEW COMPARISON:  07/28/2024, CT 07/22/2024 FINDINGS: The cardiomediastinal contours are normal. Mild chronic bronchial thickening. Pulmonary vasculature is normal. No consolidation, pleural effusion, or pneumothorax. No acute osseous abnormalities are seen. IMPRESSION: Mild chronic bronchial thickening.  No acute findings. Electronically Signed   By: Andrea Gasman M.D.   On: 09/06/2024 21:00    Pertinent labs & imaging results that were available during my care of the patient were reviewed by me and considered in my medical decision making (see MDM for details).  Medications Ordered in ED Medications  ipratropium-albuterol  (DUONEB) 0.5-2.5 (3) MG/3ML nebulizer solution 3 mL (3 mLs Nebulization Given 09/06/24 2137)  iohexol  (OMNIPAQUE ) 350 MG/ML injection 75 mL (75 mLs Intravenous Contrast Given 09/06/24 2225)  Procedures Procedures  (including critical care time)  Medical Decision Making / ED Course   This patient presents to the ED for concern of chest pain, syncope, this involves an extensive number of treatment  options, and is a complaint that carries with it a high risk of complications and morbidity.  The differential diagnosis includes pulmonary fibrosis, PE, hypoxia, respiratory distress, chronic respiratory disease, consider ACS.  MDM: On exam, patient with mild tachypnea, vital signs otherwise normal.  She does have coarse breath sounds, some trace wheezing.  Given her history of pulmonary fibrosis, we will provide her with some breathing treatments here in the ED.  With reported syncope, ordered a D-dimer which was positive.  Sent patient over for CTA of chest.  Patient without any infectious symptoms, chest x-ray does not show pneumonia.  No leukocytosis.  Given the patient's 3 days of chest pain, her initial troponin does not show any elevation.  Unlikely to be ACS.  Her EKG, there is a normal sinus rhythm, no ST segment depressions or elevations, no T wave versions, no acute ischemia.  Symptoms do not appear consistent with dissection.  With the patient's pulmonary fibrosis, I believe that likely was the cause of her lightheaded episode.  When the patient syncopized, she reportedly did that after returning from going to the bathroom and sitting in bed.  There was no fall or head strike.  Reassessment 11:05 PM-patient CTA negative for PE.  Does show some inflammatory changes.  Have given the patient some steroids here in the ED.  Chest pain improved.  Will discharge patient with prescription for steroids, azithromycin .  Patient's blood work overall normal.  Do not believe patient requires admission for possible syncope.  Will discharge patient.   Additional history obtained: -Additional history obtained from son-in-law at bedside -External records from outside source obtained and reviewed including: Chart review including previous notes, labs, imaging, consultation notes   Lab Tests: -I ordered, reviewed, and interpreted labs.   The pertinent results include:   Labs Reviewed  D-DIMER,  QUANTITATIVE - Abnormal; Notable for the following components:      Result Value   D-Dimer, Quant 2.18 (*)    All other components within normal limits  BASIC METABOLIC PANEL WITH GFR  CBC  TROPONIN T, HIGH SENSITIVITY  TROPONIN T, HIGH SENSITIVITY      EKG my independent review of the patient's EKG shows no ST segment depressions or elevations, no T wave inversions, no evidence of acute ischemia.  EKG Interpretation Date/Time:    Ventricular Rate:    PR Interval:    QRS Duration:    QT Interval:    QTC Calculation:   R Axis:      Text Interpretation:           Imaging Studies ordered: I ordered imaging studies including CTA chest, chest x-ray I independently visualized and interpreted imaging. I agree with the radiologist interpretation   Medicines ordered and prescription drug management: Meds ordered this encounter  Medications   ipratropium-albuterol  (DUONEB) 0.5-2.5 (3) MG/3ML nebulizer solution 3 mL   iohexol  (OMNIPAQUE ) 350 MG/ML injection 75 mL    -I have reviewed the patients home medicines and have made adjustments as needed  Cardiac Monitoring: The patient was maintained on a cardiac monitor.  I personally viewed and interpreted the cardiac monitored which showed an underlying rhythm of: Normal sinus rhythm  Social Determinants of Health:  Factors impacting patients care include: Lack of access to primary care   Reevaluation:  After the interventions noted above, I reevaluated the patient and found that they have :improved  Co morbidities that complicate the patient evaluation  Past Medical History:  Diagnosis Date   Aortic atherosclerosis 06/26/2017   GERD (gastroesophageal reflux disease) 02/14/2019   Hiatal hernia 02/14/2019   Vitamin D  deficiency 09/28/2017      Dispostion: I considered admission for this patient, however with her improvement, reassuring workup she is appropriate for discharge.     Final Clinical Impression(s) / ED  Diagnoses Final diagnoses:  None     @PCDICTATION @    Mannie Pac T, DO 09/06/24 2312

## 2024-09-06 NOTE — Discharge Instructions (Addendum)
 While you were in the emergency room, you had blood work done that was normal.  Your CT scan of your chest does show some nodules on your lungs that you will need to follow-up with your pulmonologist about.  I have sent you with a prescription for some steroids.  You may take those as instructed by the dose packaging.  You may also take azithromycin , an antibiotic to help prevent worsening inflammation in your lungs.  Call your primary care doctor tomorrow.  Return to the emergency room if you develop increasing difficulty with your breathing, fever or chills.

## 2024-09-06 NOTE — ED Triage Notes (Signed)
 Pt POV reporting chest pain that radiates to back and SOB x3 days, also reporting syncopal episode today while walking to the restroom, no LOC, did not hit head. Hx pulmonary fibrosis.

## 2024-09-07 ENCOUNTER — Other Ambulatory Visit: Payer: Self-pay

## 2024-09-13 ENCOUNTER — Encounter: Payer: Self-pay | Admitting: Pulmonary Disease

## 2024-09-13 ENCOUNTER — Ambulatory Visit (INDEPENDENT_AMBULATORY_CARE_PROVIDER_SITE_OTHER): Payer: Self-pay | Admitting: Pulmonary Disease

## 2024-09-13 ENCOUNTER — Other Ambulatory Visit: Payer: Self-pay

## 2024-09-13 ENCOUNTER — Telehealth: Payer: Self-pay | Admitting: Pulmonary Disease

## 2024-09-13 VITALS — BP 126/78 | HR 84 | Temp 97.4°F | Ht 61.0 in | Wt 143.0 lb

## 2024-09-13 DIAGNOSIS — K449 Diaphragmatic hernia without obstruction or gangrene: Secondary | ICD-10-CM

## 2024-09-13 DIAGNOSIS — R0602 Shortness of breath: Secondary | ICD-10-CM

## 2024-09-13 DIAGNOSIS — K219 Gastro-esophageal reflux disease without esophagitis: Secondary | ICD-10-CM

## 2024-09-13 DIAGNOSIS — K224 Dyskinesia of esophagus: Secondary | ICD-10-CM

## 2024-09-13 DIAGNOSIS — R053 Chronic cough: Secondary | ICD-10-CM

## 2024-09-13 MED ORDER — OMEPRAZOLE 20 MG PO CPDR
20.0000 mg | DELAYED_RELEASE_CAPSULE | Freq: Two times a day (BID) | ORAL | 11 refills | Status: DC
  Filled 2024-09-13: qty 60, 30d supply, fill #0

## 2024-09-13 MED ORDER — PREDNISONE 10 MG PO TABS
40.0000 mg | ORAL_TABLET | Freq: Every day | ORAL | 0 refills | Status: AC
  Filled 2024-09-13: qty 20, 5d supply, fill #0

## 2024-09-13 MED ORDER — BUDESONIDE-FORMOTEROL FUMARATE 160-4.5 MCG/ACT IN AERO
2.0000 | INHALATION_SPRAY | Freq: Two times a day (BID) | RESPIRATORY_TRACT | 6 refills | Status: DC
  Filled 2024-09-13 – 2024-09-19 (×3): qty 10.2, 30d supply, fill #0

## 2024-09-13 NOTE — Progress Notes (Signed)
 Amy Flowers    [id number]    1966/07/05  Primary Care Physician:Newlin, Corrina, MD  Referring Physician: Neysa Caron PARAS, DO [address] [place],  [place] [address]  Chief complaint: Referral for cough, ILD evaluation  HPI: 58 y.o. who  has a past medical history of Aortic atherosclerosis (06/26/2017), GERD (gastroesophageal reflux disease) (02/14/2019), Hiatal hernia (02/14/2019), and Vitamin D  deficiency (09/28/2017).  Discussed the use of AI scribe software for clinical note transcription with the patient, who gave verbal consent to proceed.  History of Present Illness Amy Flowers is a 58 year old female with pulmonary fibrosis who presents with persistent cough and shortness of breath. She is accompanied by her daughter.  Cough and dyspnea - Persistent cough for two months, progressively worsening - Significant nocturnal dyspnea disrupting sleep - Three hospitalizations for severe coughing and dyspnea - Albuterol  inhaler provided minimal relief - Prednisone  treatment resulted in temporary symptom improvement, with recurrence of cough after completion - Initial urgent care evaluation ruled out pneumonia - Emergency department visits suggested possible pulmonary fibrosis  Generalized weakness and sleep disruption - Generalized weakness, described as similar to hypoglycemia - Weakness attributed to persistent cough and sleep disruption  Gastrointestinal symptoms - History of esophageal dysmotility, gastroesophageal reflux disease (GERD), and hiatal hernia, confirmed by 2020 esophagram - Uses over-the-counter antacids such as Tums  Environmental and family history - No exposure to wood smoke, mold, or other environmental factors - No pets - No family history of lung disease     Relevant Pulmonary history: Pets: No pets Occupation: Works in [employer] as a Nature conservation officer Exposures: No mold, hot tub, Financial controller.  No feather pillow comforter No h/o  chemo/XRT/amiodarone/macrodantin/MTX  No exposure to asbestos, silica or other organic allergens  Smoking history: Never smoker, no exposure to wood smoke Travel history: Moved from [place] in 2018.  No significant recent travel Family history: No family history of lung disease   Outpatient Encounter Medications as of 09/13/2024  Medication Sig   albuterol  (PROVENTIL ) (2.5 MG/3ML) 0.083% nebulizer solution Take 3 mLs (2.5 mg total) by nebulization every 6 (six) hours as needed for wheezing or shortness of breath.   acetaminophen  (TYLENOL ) 325 MG tablet Take 2 tablets (650 mg total) by mouth every 6 (six) hours as needed. (Patient not taking: Reported on 09/13/2024)   azithromycin  (ZITHROMAX ) 250 MG tablet Take first 2 tablets together, then 1 every day until finished. (Patient not taking: Reported on 09/13/2024)   benzonatate  (TESSALON ) 100 MG capsule Take 1 capsule (100 mg total) by mouth 2 (two) times daily as needed for cough. (Patient not taking: Reported on 09/13/2024)   cholecalciferol (VITAMIN D3) 25 MCG (1000 UNIT) tablet Take 1,000 Units by mouth daily. (Patient not taking: Reported on 09/13/2024)   guaiFENesin  (MUCINEX ) 600 MG 12 hr tablet Take 1 tablet (600 mg total) by mouth 2 (two) times daily. (Patient not taking: Reported on 09/13/2024)   methylPREDNISolone  (MEDROL  DOSEPAK) 4 MG TBPK tablet Take as instructed by dose packaging (Patient not taking: Reported on 09/13/2024)   predniSONE  (DELTASONE ) 5 MG tablet Take 1 tablet (5 mg total) by mouth daily with breakfast. (Patient not taking: Reported on 09/13/2024)   promethazine -dextromethorphan (PROMETHAZINE -DM) 6.25-15 MG/5ML syrup Take 5 mLs by mouth at bedtime as needed for cough. (Patient not taking: Reported on 09/13/2024)   tiZANidine  (ZANAFLEX ) 4 MG tablet Take 1 tablet (4 mg total) by mouth every 8 (eight) hours as needed. (Patient not taking: Reported on 09/13/2024)  No facility-administered encounter medications on file as of 09/13/2024.      Physical Exam: Today's Vitals   09/13/24 1253  BP: 126/78  Pulse: 84  Temp: (!) 97.4 F (36.3 C)  TempSrc: Oral  SpO2: 98%  Weight: 143 lb (64.9 kg)  Height: 5' 1 (1.549 m)   Body mass index is 27.02 kg/m.  Physical Exam GEN: No acute distress CV: Regular rate and rhythm no murmurs LUNGS: Clear to auscultation bilaterally normal respiratory effort SKIN JOINTS: Warm and dry no rash    Data Reviewed: Imaging: CTA 07/22/2024-No pulm embolism.  Scattered areas of subpleural reticulation in the upper lobe.  CTA 09/07/2023-no pulm embolism.  Multiple pulmonary nodules in the right upper lobe measuring up to 4 mm.  Mucous plugging in the right upper lobe.  I have reviewed the images personally.   PFTs:  FENO 09/13/2024-unable to complete  Labs: CTD serologies 10/14/2023-negative  CBC 07/28/2024-WBC 4.4, eosinophils 23%, absolute eosinophil count 1012 IgE 11/24/2023-672  Assessment & Plan Chronic cough and shortness of breath under evaluation for interstitial lung disease, pulmonary fibrosis, or asthma-like syndrome Chronic cough and shortness of breath for two months, worsening with nocturnal symptoms. Previous evaluations suggested interstitial lung disease or pulmonary fibrosis, but the that may be an overcall. Differential includes asthma-like syndrome.  Noted to have elevated peripheral eosinophils, IgE and will need to be evaluated for eosinophilic pneumonia.  She is unable to complete FeNO today  Previous use of albuterol  inhaler and prednisone  provided some relief. Symptoms may also be exacerbated by acid reflux and esophageal dysmotility. Current lung scans show inflammation, possibly bronchitis. Further evaluation needed to confirm diagnosis.  - Prescribe Symbicort inhaler, two puffs in the morning and two puffs in the evening daily. - Prescribe prednisone  for symptomatic relief.  40 mg a day for 5 days - Order high-resolution chest CT to evaluate lung  condition. - Order pulmonary function tests (PFTs) to assess lung function.  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 Symptoms managed with over-the-counter antacids. - Prescribe Prilosec twice daily to manage GERD and potentially reduce cough.  Recommendations: Pred burst Start Symbicort High-res CT, PFTs Prilosec  I personally spent a total of 59 minutes in the care of the patient today including preparing to see the patient, getting/reviewing separately obtained history, performing a medically appropriate exam/evaluation, counseling and educating, placing orders, and independently interpreting results.   Lonna Coder MD  Pulmonary and Critical Care 09/13/2024, 1:04 PM  CC: Neysa Caron PARAS, DO

## 2024-09-13 NOTE — Telephone Encounter (Signed)
 Patient's daughter went to the pharmacy but medication was too expensive. She is dropping off patient assistance form for the Spiriva Respimat. Form will be placed in Dr.Mannam's box.

## 2024-09-13 NOTE — Patient Instructions (Signed)
  VISIT SUMMARY: Today, you were seen for a persistent cough and shortness of breath that have been worsening over the past two months. We discussed your symptoms, including generalized weakness and gastrointestinal issues, and planned further evaluations and treatments.  YOUR PLAN: CHRONIC COUGH AND SHORTNESS OF BREATH: You have been experiencing a persistent cough and shortness of breath, which may be related to interstitial lung disease, pulmonary fibrosis, or an asthma-like syndrome. Your symptoms have been worsening, especially at night. -Start using Symbicort inhaler, two puffs in the morning and two puffs in the evening daily. -Continue taking prednisone  as prescribed for symptomatic relief. -A chest CT scan has been ordered to evaluate your lung condition. -Pulmonary function tests (PFTs) have been ordered to assess your lung function.  ESOPHAGEAL DYSMOTILITY WITH GASTROESOPHAGEAL REFLUX DISEASE (GERD) AND HIATAL HERNIA: Your gastrointestinal issues, including esophageal dysmotility, GERD, and hiatal hernia, may be contributing to your chronic cough. -A stronger acid medication will be prescribed to manage your GERD and potentially reduce your cough.                                 Contains text generated by [employer].

## 2024-09-14 NOTE — Telephone Encounter (Signed)
 Received form. Will fax form to [phone].

## 2024-09-15 ENCOUNTER — Telehealth: Payer: Self-pay

## 2024-09-15 NOTE — Telephone Encounter (Signed)
 Copied from CRM [id number]. Topic: General - Other >> Sep 14, 2024  2:26 PM Russell PARAS wrote: Reason for CRM:   Pt's daughter Souzan contacted clinic regarding pt's visit with Mannam on 10/7. He advised pt he would provide a note for her so that she can return to work; however, a note was never provided.   Requested if note could be sent to her MyChart  CB#  [phone]  ATCx1 LMTCB,letter located in [place]

## 2024-09-19 ENCOUNTER — Other Ambulatory Visit: Payer: Self-pay

## 2024-09-20 ENCOUNTER — Other Ambulatory Visit: Payer: Self-pay

## 2024-09-20 ENCOUNTER — Telehealth: Payer: Self-pay

## 2024-09-20 NOTE — Telephone Encounter (Signed)
 Form faxed. Received fax confirmation.NFN

## 2024-09-20 NOTE — Telephone Encounter (Signed)
 Received notification from [employer] regarding approval for BREYNA(GENERIC SYMBICORT). Patient assistance approved from 09/20/2024 to 09/20/2025.  Medication will ship to [facility] [address], [id number]  Pt ID: [id number]  Company phone: [phone]

## 2024-09-21 ENCOUNTER — Ambulatory Visit
Admission: RE | Admit: 2024-09-21 | Discharge: 2024-09-21 | Disposition: A | Source: Ambulatory Visit | Attending: Pulmonary Disease

## 2024-09-21 DIAGNOSIS — R053 Chronic cough: Secondary | ICD-10-CM

## 2024-09-22 ENCOUNTER — Other Ambulatory Visit: Payer: Self-pay

## 2024-09-29 ENCOUNTER — Ambulatory Visit: Payer: Self-pay | Admitting: Pulmonary Disease

## 2024-09-29 DIAGNOSIS — J849 Interstitial pulmonary disease, unspecified: Secondary | ICD-10-CM

## 2024-09-29 DIAGNOSIS — R053 Chronic cough: Secondary | ICD-10-CM

## 2024-09-30 ENCOUNTER — Other Ambulatory Visit (INDEPENDENT_AMBULATORY_CARE_PROVIDER_SITE_OTHER): Payer: Self-pay

## 2024-09-30 ENCOUNTER — Telehealth: Payer: Self-pay | Admitting: Pulmonary Disease

## 2024-09-30 DIAGNOSIS — R053 Chronic cough: Secondary | ICD-10-CM

## 2024-09-30 DIAGNOSIS — J849 Interstitial pulmonary disease, unspecified: Secondary | ICD-10-CM

## 2024-09-30 NOTE — Telephone Encounter (Signed)
 Copied from CRM [id number]. Topic: Clinical - Lab/Test Results >> Sep 30, 2024  8:44 AM Joesph PARAS wrote: Reason for CRM: Informed patient of result note by Dr. Theophilus. No questions at this time.

## 2024-09-30 NOTE — Telephone Encounter (Signed)
 Called patient to scheduled lab appointment. Patient would like her Ct results visible in MyChart so she can review them.

## 2024-09-30 NOTE — Telephone Encounter (Signed)
 See 10/23 encounter. Results were sent via Mychart

## 2024-10-03 ENCOUNTER — Other Ambulatory Visit: Payer: Self-pay

## 2024-10-03 ENCOUNTER — Ambulatory Visit: Payer: Self-pay | Admitting: Internal Medicine

## 2024-10-03 ENCOUNTER — Telehealth: Payer: Self-pay

## 2024-10-03 ENCOUNTER — Encounter: Payer: Self-pay | Admitting: Internal Medicine

## 2024-10-03 ENCOUNTER — Ambulatory Visit: Payer: Self-pay | Admitting: Pulmonary Disease

## 2024-10-03 VITALS — BP 123/83 | HR 95 | Temp 97.6°F | Ht 61.0 in | Wt 148.0 lb

## 2024-10-03 DIAGNOSIS — R059 Cough, unspecified: Secondary | ICD-10-CM | POA: Insufficient documentation

## 2024-10-03 DIAGNOSIS — R053 Chronic cough: Secondary | ICD-10-CM

## 2024-10-03 MED ORDER — PROMETHAZINE-DM 6.25-15 MG/5ML PO SYRP
5.0000 mL | ORAL_SOLUTION | Freq: Every evening | ORAL | 0 refills | Status: DC | PRN
  Filled 2024-10-03: qty 118, 23d supply, fill #0

## 2024-10-03 MED ORDER — METHYLPREDNISOLONE ACETATE 80 MG/ML IJ SUSP
120.0000 mg | Freq: Once | INTRAMUSCULAR | Status: AC
  Administered 2024-10-03: 120 mg via INTRAMUSCULAR

## 2024-10-03 MED ORDER — PANTOPRAZOLE SODIUM 40 MG PO TBEC
40.0000 mg | DELAYED_RELEASE_TABLET | Freq: Every day | ORAL | 2 refills | Status: DC
  Filled 2024-10-03: qty 30, 30d supply, fill #0
  Filled 2024-10-28: qty 30, 30d supply, fill #1

## 2024-10-03 MED ORDER — BUDESONIDE 0.25 MG/2ML IN SUSP
RESPIRATORY_TRACT | 12 refills | Status: DC
  Filled 2024-10-03 (×2): qty 120, 30d supply, fill #0
  Filled 2024-10-26: qty 120, 30d supply, fill #1

## 2024-10-03 MED ORDER — TRAMADOL HCL 50 MG PO TABS
50.0000 mg | ORAL_TABLET | ORAL | 0 refills | Status: AC | PRN
  Filled 2024-10-03 (×2): qty 30, 5d supply, fill #0

## 2024-10-03 MED ORDER — FAMOTIDINE 20 MG PO TABS
20.0000 mg | ORAL_TABLET | Freq: Every day | ORAL | 11 refills | Status: AC
  Filled 2024-10-03: qty 30, 30d supply, fill #0
  Filled 2024-10-28: qty 30, 30d supply, fill #1

## 2024-10-03 MED ORDER — PROMETHAZINE-DM 6.25-15 MG/5ML PO SYRP
5.0000 mL | ORAL_SOLUTION | Freq: Every evening | ORAL | 0 refills | Status: DC | PRN
  Filled 2024-10-03: qty 240, 48d supply, fill #0

## 2024-10-03 NOTE — Telephone Encounter (Signed)
 E2C2 Pulmonary Triage - Initial Assessment Questions "Chief Complaint (e.g., cough, sob, wheezing, fever, chills, sweat or additional symptoms) *Go to specific symptom protocol after initial questions. Shortness of breath, inability to sleep, cough  "How long have symptoms been present?" Extended time, patient seen in office on 10/07 for concerns but it has worsened. The inhaler is causing side effects  Have you tested for COVID or Flu? Note: If not, ask patient if a home test can be taken. If so, instruct patient to call back for positive results. No - unknown, difficulty with assessment with daughter  MEDICINES:   "Have you used any OTC meds to help with symptoms?" No - unknown If yes, ask "What medications?" unknown  "Have you used your inhalers/maintenance medication?" Yes If yes, "What medications?" Symbicort - causing reaction of lip swelling, headache  If inhaler, ask "How many puffs and how often?" Note: Review instructions on medication in the chart. N/a  OXYGEN: "Do you wear supplemental oxygen?" No If yes, "How many liters are you supposed to use?" N/a  "Do you monitor your oxygen levels?" No If yes, What is your reading (oxygen level) today? Educated to purchase pulse oximeter asap   What is your usual oxygen saturation reading?  (Note: Pulmonary O2 sats should be 90% or greater) N/a   FYI Only or Action Required?: Action required by provider: Please see notes below and contact patient's daughter to assist with soonest acute appt.  Patient is followed in Pulmonology for Cough, SOB, last seen on 09/13/2024 by Mannam, Praveen, MD.  Called Nurse Triage reporting Shortness of Breath.  Symptoms began several weeks ago.  Interventions attempted: Rescue inhaler.  Symptoms are: gradually worsening.  Triage Disposition: See HCP Within 4 Hours (Or PCP Triage)  Patient/caregiver understands and will follow disposition?: Yes   Copied from CRM [id number]. Topic:  Clinical - Red Word Triage >> Oct 03, 2024  7:38 AM Nathanel DEL wrote: Red Word that prompted transfer to Nurse Triage: SOB, hard time breathing, was seen 10/15 and given inhaler.  When she uses it her lips swell, headache, body shaking.  Could not sleep last night b/c could not breathe Daughter Souzan calling Reason for Disposition  [1] MILD difficulty breathing (e.g., minimal/no SOB at rest, SOB with walking, pulse < 100) AND [2] NEW-onset or WORSE than normal  Answer Assessment - Initial Assessment Questions Patient's daughter reports that when patient uses the new inhaler, she has lip swelling, headache, and body shaking. Daughter states she has had this reaction has happened before. Daughter educated to call 911 if patient's tongue begins swelling or airway is compromised. Daughter states patient was awake all night with shortness of breath and just finally got to sleep.  This RN attempted to schedule an appointment but given a hard stop in the chart. Daughter informed that someone from scheduling will call back to assist with scheduling. Please contact [phone] to contact daughter Souzan to assist   1. RESPIRATORY STATUS: Describe your breathing? (e.g., wheezing, shortness of breath, unable to speak, severe coughing)      Patient was awake all night  2. ONSET: When did this breathing problem begin?      Patient was seen in office on October 7th 4. SEVERITY: How bad is your breathing? (e.g., mild, moderate, severe)      Shortness of breath... awake all night 5. RECURRENT SYMPTOM: Have you had difficulty breathing before? If Yes, ask: When was the last time? and What happened that time?  Yes patient seen in office on October 15 6. CARDIAC HISTORY: Do you have any history of heart disease? (e.g., heart attack, angina, bypass surgery, angioplasty)      N/a 7. LUNG HISTORY: Do you have any history of lung disease?  (e.g., pulmonary embolus, asthma, emphysema)     Cough and  shortness of breath 9. OTHER SYMPTOMS: Do you have any other symptoms? (e.g., chest pain, cough, dizziness, fever, runny nose)     Cough, weakness, low appetite 10. O2 SATURATION MONITOR:  Do you use an oxygen saturation monitor (pulse oximeter) at home? If Yes, ask: What is your reading (oxygen level) today? What is your usual oxygen saturation reading? (e.g., 95%)       No way to monitor oxygen levels  Protocols used: Breathing Difficulty-A-AH

## 2024-10-03 NOTE — Assessment & Plan Note (Addendum)
 Onset was Aug 23025 in setting of UIP  - 10/03/2024 rx cyclical cough rx with max gerd rx/ cough suppression with phenergan  DM and tramadol, and depomedrol 120  mg IM   Of the three most common causes of  Sub-acute / recurrent or chronic cough, only one (GERD)  can actually contribute to/ trigger  the other two (asthma and post nasal drip syndrome)  and perpetuate the cylce of cough.  While not intuitively obvious, many patients with chronic low grade reflux do not cough until there is a primary insult that disturbs the protective epithelial barrier and exposes sensitive nerve endings.   This is typically viral but can due to PNDS and  either may apply here.    >>>  The point is that once this occurs, it is difficult to eliminate the cycle  using anything but a maximally effective acid suppression regimen at least in the short run, accompanied by an appropriate diet to address non acid GERD and control / eliminate the cough itself for at least 3 days with tradamadol >>> also added depomedrol 120 IM and added budesonide 0.25 mg bid to alb nebs in case component of airway inflammation and bronchospasm based on her perception that neb worked   Discussed in detail all the  indications, usual  risks and alternatives  relative to the benefits with patient who agrees to proceed with Rx as outlined.             Each maintenance medication was reviewed in detail including emphasizing most importantly the difference between maintenance and prns and under what circumstances the prns are to be triggered using an action plan format where appropriate.  Total time for H and P, chart review, counseling, reviewing neb device(s) and generating customized AVS unique to this office visit / same day charting = 42 min with pt new to me with  multiple  refractory respiratory  symptoms of uncertain etiology

## 2024-10-03 NOTE — Telephone Encounter (Addendum)
 Copied from CRM #[id number]. Topic: Clinical - Prescription Issue >> Sep 30, 2024  8:40 AM Joesph PARAS wrote: Reason for CRM: Patient's daughter is calling and would like to change the inhaler she is one due to a snius headache it is giving her when she uses it.   Called and spoke with patients daughter, Souzan.  Patient used Symbicort one time and it gave patient a severe sinus headache.  Patient does not want to use this inhaler.  Please can we send in another inhaler in place of the Symbicort?     Patient has an OV with Dr. Darlean today at 3:30pm for an acute visit.

## 2024-10-03 NOTE — Patient Instructions (Addendum)
 Pantoprazole  (protonix ) 40 mg   Take  30-60 min before first meal of the day and Pepcid  (famotidine )  20 mg after supper until return to office - this is the best way to tell whether stomach acid is contributing to your problem.    GERD (REFLUX)  is an extremely common cause of respiratory symptoms just like yours , many times with no obvious heartburn at all.    It can be treated with medication, but also with lifestyle changes including elevation of the head of your bed (ideally with 6 -8inch blocks under the headboard of your bed),  Smoking cessation, avoidance of late meals, excessive alcohol, and avoid fatty foods, chocolate, peppermint, colas, red wine, and acidic juices such as orange juice.  NO MINT OR MENTHOL PRODUCTS SO NO COUGH DROPS - LUDEN's pectin cough drops are ok  USE SUGARLESS CANDY INSTEAD (Jolley ranchers or Stover's or Life Savers) or even ice chips will also do - the key is to swallow to prevent all throat clearing. NO OIL BASED VITAMINS - use powdered substitutes.  Avoid fish oil when coughing.   Phenergan  DM 1-2tsp every 4 hours supplemented by  Tramadol 50 mg one every 4  hours as needed (works for pain too)  Add budesonide to the nebulizer albuterol   twice daily and extra doses of albuterol  are fine but not the budesonide

## 2024-10-03 NOTE — Telephone Encounter (Addendum)
 Called [facility] and spoke with Adrien r/t setting up pt appt.

## 2024-10-03 NOTE — Progress Notes (Signed)
 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 Cough is occ prod with bloody to white sputum.   Dyspnea:  [employer] working unil 2 weeks prior and now more sob with exertion  Cough: viollent to point of gag/ vomit / urinary incont/ min mucoid ? Occ streaks of blood  Sleep: not able to sleep even flat bed  on side    SABA use: neb seems to help the most     No obvious day to day or daytime pattern/variability or assoc excess/ purulent sputum or mucus plugs  or cp or chest tightness, subjective wheeze or overt sinus or hb symptoms.    Also denies any obvious fluctuation of symptoms with weather or environmental changes or other aggravating or alleviating factors except as outlined above   No unusual exposure hx or h/o childhood pna/ asthma or knowledge of premature birth.  Current Allergies, Complete Past Medical History, Past Surgical History, Family History, and Social History were reviewed in [employer] record.  ROS  The following are not active complaints unless bolded Hoarseness, sore throat, dysphagia, dental problems, itching, sneezing,  nasal congestion or discharge of excess mucus or purulent secretions, ear ache,   fever, chills, sweats, unintended wt loss or wt gain, classically pleuritic or exertional cp,  orthopnea pnd or arm/hand swelling  or leg swelling, presyncope, palpitations, abdominal pain, anorexia, nausea, vomiting, diarrhea  or change in bowel habits or change in bladder habits, change in stools or  change in urine, dysuria, hematuria,  rash, arthralgias, visual complaints, headache, numbness, weakness or ataxia or problems with walking or coordination,  change in mood or  memory.             Outpatient Medications Prior to Visit - - NOTE:   Unable to verify as accurately reflecting what pt takes    Medication Sig Dispense Refill   albuterol  (PROVENTIL ) (2.5 MG/3ML) 0.083% nebulizer solution Take 3 mLs (2.5 mg total) by nebulization every 6 (six) hours as needed for wheezing or shortness of breath. 75 mL 3   acetaminophen  (TYLENOL ) 325 MG tablet Take 2 tablets (650 mg total) by mouth every 6 (six) hours as needed. (Patient not taking: Reported on 10/03/2024) 30 tablet 0   azithromycin  (ZITHROMAX ) 250 MG tablet Take first 2 tablets together, then 1 every day until finished. (Patient not taking: Reported on 10/03/2024) 6 tablet 0   benzonatate  (TESSALON ) 100 MG capsule Take 1 capsule (100 mg total) by mouth 2 (two) times daily as needed for cough. (Patient not taking: Reported on 10/03/2024) 20 capsule 0   budesonide-formoterol (SYMBICORT) 160-4.5 MCG/ACT inhaler Inhale 2 puffs into the lungs in the morning and at bedtime. (Patient not taking: Reported on 10/03/2024) 10.2 g 6   cholecalciferol (VITAMIN D3) 25 MCG (1000 UNIT) tablet Take 1,000 Units by mouth daily. (Patient not taking: Reported on 10/03/2024)     guaiFENesin  (MUCINEX ) 600 MG 12 hr  tablet Take 1 tablet (600 mg total) by mouth 2 (two) times daily. (Patient not taking: Reported on 10/03/2024) 30 tablet 0   omeprazole  (PRILOSEC) 20 MG capsule Take 1 capsule (20 mg total) by mouth 2 (two) times daily before a meal. (Patient not taking: Reported on 10/03/2024) 60 capsule 11   promethazine -dextromethorphan (PROMETHAZINE -DM) 6.25-15 MG/5ML syrup Take 5 mLs by mouth at bedtime as needed for cough. (Patient not taking: Reported on 10/03/2024) 118 mL 0   tiZANidine  (ZANAFLEX ) 4 MG tablet Take 1 tablet (4 mg total) by mouth every 8 (eight)  hours as needed. (Patient not taking: Reported on 10/03/2024) 60 tablet 1   No facility-administered medications prior to visit.    Past Medical History:  Diagnosis Date   Aortic atherosclerosis 06/26/2017   GERD (gastroesophageal reflux disease) 02/14/2019   Hiatal hernia 02/14/2019   Vitamin D  deficiency 09/28/2017      Objective:     BP 123/83   Pulse 95   Temp 97.6 F (36.4 C) (Oral)   Ht 5' 1 (1.549 m) Comment: per pt  Wt 148 lb (67.1 kg)   SpO2 91% Comment: on RA  BMI 27.96 kg/m   SpO2: 91 % (on RA) amb arabic female severe coughing fits    HEENT : Oropharynx  clear / top denture   Nasal turbinates nl    NECK :  without  apparent JVD/ palpable Nodes/TM    LUNGS: no acc muscle use,  Nl contour chest which is clear to A and P bilaterally without cough on insp or exp maneuvers   CV:  RRR  no s3 or murmur or increase in P2, and no edema   ABD:  soft and nontender   MS:    ext warm without deformities Or obvious joint restrictions  calf tenderness, cyanosis or clubbing    SKIN: warm and dry without lesions    NEURO:  alert, approp, nl sensorium with  no motor or cerebellar deficits apparent.       I personally reviewed images and agree with radiology impression as follows:   Chest HRCT  09/22/24  ?mild   UIP     Assessment   Assessment & Plan Chronic cough Onset was Aug 23025 in setting of UIP  - 10/03/2024 rx cyclical cough rx with max gerd rx/ cough suppression with phenergan  DM and tramadol, and depomedrol 120  mg IM   Of the three most common causes of  Sub-acute / recurrent or chronic cough, only one (GERD)  can actually contribute to/ trigger  the other two (asthma and post nasal drip syndrome)  and perpetuate the cylce of cough.  While not intuitively obvious, many patients with chronic low grade reflux do not cough until there is a primary insult that disturbs the protective epithelial barrier and exposes sensitive nerve endings.   This is  typically viral but can due to PNDS and  either may apply here.    >>>  The point is that once this occurs, it is difficult to eliminate the cycle  using anything but a maximally effective acid suppression regimen at least in the short run, accompanied by an appropriate diet to address non acid GERD and control / eliminate the cough itself for at least 3 days with tradamadol >>> also added depomedrol 120 IM and added budesonide 0.25 mg bid to alb nebs in case component of airway inflammation and bronchospasm based on her perception that neb worked   Discussed in detail all the  indications, usual  risks and alternatives  relative to the benefits with patient who agrees to proceed with Rx as outlined.             Each maintenance medication was reviewed in detail including emphasizing most importantly the difference between maintenance and prns and under what circumstances the prns are to be triggered using an action plan format where appropriate.  Total time for H and P, chart review, counseling, reviewing neb device(s) and generating customized AVS unique to this office visit / same day charting = 42 min with pt new to me with  multiple  refractory respiratory  symptoms of uncertain etiology          AVS  Patient Instructions  Pantoprazole  (protonix ) 40 mg   Take  30-60 min before first meal of the day and Pepcid  (famotidine )  20 mg after supper until return to office - this is the best way to tell whether stomach acid is contributing to your problem.    GERD (REFLUX)  is an extremely common cause of respiratory symptoms just like yours , many times with no obvious heartburn at all.    It can be treated with medication, but also with lifestyle changes including elevation of the head of your bed (ideally with 6 -8inch blocks under the headboard of your bed),  Smoking cessation, avoidance of late meals, excessive alcohol, and avoid fatty foods, chocolate, peppermint, colas, red wine, and acidic  juices such as orange juice.  NO MINT OR MENTHOL PRODUCTS SO NO COUGH DROPS - LUDEN's pectin cough drops are ok  USE SUGARLESS CANDY INSTEAD (Jolley ranchers or Stover's or Life Savers) or even ice chips will also do - the key is to swallow to prevent all throat clearing. NO OIL BASED VITAMINS - use powdered substitutes.  Avoid fish oil when coughing.   Phenergan  DM 1-2tsp every 4 hours supplemented by  Tramadol 50 mg one every 4  hours as needed (works for pain too)  Add budesonide to the nebulizer albuterol   twice daily and extra doses of albuterol  are fine but not the budesonide     Issiac Jamar, MD 10/03/2024

## 2024-10-05 LAB — ANTI-NUCLEAR AB-TITER (ANA TITER): ANA Titer 1: 1:80 {titer} — ABNORMAL HIGH

## 2024-10-05 LAB — SJOGRENS SYNDROME-A EXTRACTABLE NUCLEAR ANTIBODY: SSA (Ro) (ENA) Antibody, IgG: <1 AI

## 2024-10-05 LAB — ANA,IFA RA DIAG PNL W/RFLX TIT/PATN
Anti Nuclear Antibody (ANA): POSITIVE — AB
Cyclic Citrullin Peptide Ab: <16 U
Rheumatoid fact SerPl-aCnc: <10 [IU]/mL (ref <14)

## 2024-10-05 LAB — SJOGRENS SYNDROME-B EXTRACTABLE NUCLEAR ANTIBODY: SSB (La) (ENA) Antibody, IgG: <1 AI

## 2024-10-05 LAB — ANCA SCREEN W REFLEX TITER: ANCA SCREEN: NEGATIVE

## 2024-10-05 LAB — ANTI-SCLERODERMA ANTIBODY: Scleroderma (Scl-70) (ENA) Antibody, IgG: <1 AI

## 2024-10-06 ENCOUNTER — Other Ambulatory Visit: Payer: Self-pay

## 2024-10-06 MED ORDER — FLUTICASONE-SALMETEROL 500-50 MCG/ACT IN AEPB
1.0000 | INHALATION_SPRAY | Freq: Two times a day (BID) | RESPIRATORY_TRACT | 3 refills | Status: DC
  Filled 2024-10-06 – 2024-10-19 (×4): qty 60, 30d supply, fill #0

## 2024-10-06 NOTE — Telephone Encounter (Signed)
 I called and spoke to pt's daughter, Souzan. DPR. Souzan informed of Dr Jiles note and verbalized understanding. Souzan also stated that she had to pay $106 for the nebulizer and wanted to know if there was something else she could be placed on.

## 2024-10-06 NOTE — Addendum Note (Signed)
 Addended by: THEOPHILUS ROOSEVELT on: 10/06/2024 01:11 PM   Modules accepted: Orders

## 2024-10-06 NOTE — Telephone Encounter (Signed)
 I have sent an alternate inhaler called Advair to the pharmacy.

## 2024-10-12 NOTE — Telephone Encounter (Signed)
 Message relayed to patient's daughter Souzan per Dpr. NFN

## 2024-10-18 ENCOUNTER — Other Ambulatory Visit: Payer: Self-pay | Admitting: Internal Medicine

## 2024-10-18 ENCOUNTER — Other Ambulatory Visit: Payer: Self-pay

## 2024-10-18 NOTE — Telephone Encounter (Signed)
 Copied from CRM #[id number]. Topic: Clinical - Medication Refill >> Oct 18, 2024 11:05 AM Abigail D wrote: Medication: albuterol  (PROVENTIL ) (2.5 MG/3ML) 0.083% nebulizer solution - Not prescribed by Dr. Darlean but patient is requesting a refill.  Has the patient contacted their pharmacy? No (Agent: If no, request that the patient contact the pharmacy for the refill. If patient does not wish to contact the pharmacy document the reason why and proceed with request.) (Agent: If yes, when and what did the pharmacy advise?)  This is the patient's preferred pharmacy:  [facility] [address] [place] [address] Phone: [phone] Fax: [phone]  Is this the correct pharmacy for this prescription? Yes If no, delete pharmacy and type the correct one.   Has the prescription been filled recently? No  Is the patient out of the medication? No  Has the patient been seen for an appointment in the last year OR does the patient have an upcoming appointment? Yes  Can we respond through MyChart? Yes  Agent: Please be advised that Rx refills may take up to 3 business days. We ask that you follow-up with your pharmacy.

## 2024-10-19 ENCOUNTER — Other Ambulatory Visit: Payer: Self-pay

## 2024-10-19 ENCOUNTER — Ambulatory Visit: Payer: Self-pay | Admitting: Pulmonary Disease

## 2024-10-19 MED ORDER — ALBUTEROL SULFATE (2.5 MG/3ML) 0.083% IN NEBU
2.5000 mg | INHALATION_SOLUTION | Freq: Four times a day (QID) | RESPIRATORY_TRACT | 3 refills | Status: DC | PRN
  Filled 2024-10-19: qty 75, 7d supply, fill #0
  Filled 2024-10-28: qty 75, 7d supply, fill #1

## 2024-10-24 ENCOUNTER — Other Ambulatory Visit: Payer: Self-pay

## 2024-10-25 LAB — MYOMARKER 3 PLUS PROFILE (RDL)

## 2024-10-27 ENCOUNTER — Other Ambulatory Visit: Payer: Self-pay

## 2024-10-28 ENCOUNTER — Other Ambulatory Visit: Payer: Self-pay

## 2024-10-31 ENCOUNTER — Observation Stay (HOSPITAL_COMMUNITY): Payer: Self-pay

## 2024-10-31 ENCOUNTER — Emergency Department (HOSPITAL_BASED_OUTPATIENT_CLINIC_OR_DEPARTMENT_OTHER): Payer: Self-pay | Admitting: Radiology

## 2024-10-31 ENCOUNTER — Emergency Department (HOSPITAL_BASED_OUTPATIENT_CLINIC_OR_DEPARTMENT_OTHER): Payer: Self-pay

## 2024-10-31 ENCOUNTER — Observation Stay (HOSPITAL_BASED_OUTPATIENT_CLINIC_OR_DEPARTMENT_OTHER)
Admission: EM | Admit: 2024-10-31 | Discharge: 2024-11-04 | Disposition: A | Discharge status: Home / Self Care | Payer: Self-pay | Attending: Internal Medicine | Admitting: Internal Medicine

## 2024-10-31 ENCOUNTER — Encounter (HOSPITAL_BASED_OUTPATIENT_CLINIC_OR_DEPARTMENT_OTHER): Payer: Self-pay | Admitting: *Deleted

## 2024-10-31 ENCOUNTER — Other Ambulatory Visit: Payer: Self-pay

## 2024-10-31 ENCOUNTER — Other Ambulatory Visit (HOSPITAL_COMMUNITY): Payer: Self-pay

## 2024-10-31 DIAGNOSIS — K219 Gastro-esophageal reflux disease without esophagitis: Secondary | ICD-10-CM | POA: Insufficient documentation

## 2024-10-31 DIAGNOSIS — R748 Abnormal levels of other serum enzymes: Secondary | ICD-10-CM | POA: Insufficient documentation

## 2024-10-31 DIAGNOSIS — R54 Age-related physical debility: Secondary | ICD-10-CM | POA: Insufficient documentation

## 2024-10-31 DIAGNOSIS — J84114 Acute interstitial pneumonitis: Secondary | ICD-10-CM | POA: Insufficient documentation

## 2024-10-31 DIAGNOSIS — R0789 Other chest pain: Principal | ICD-10-CM | POA: Diagnosis present

## 2024-10-31 DIAGNOSIS — J8410 Pulmonary fibrosis, unspecified: Secondary | ICD-10-CM

## 2024-10-31 DIAGNOSIS — R059 Cough, unspecified: Principal | ICD-10-CM | POA: Insufficient documentation

## 2024-10-31 DIAGNOSIS — R0781 Pleurodynia: Secondary | ICD-10-CM | POA: Insufficient documentation

## 2024-10-31 DIAGNOSIS — R1011 Right upper quadrant pain: Secondary | ICD-10-CM

## 2024-10-31 DIAGNOSIS — J84112 Idiopathic pulmonary fibrosis: Secondary | ICD-10-CM

## 2024-10-31 DIAGNOSIS — R053 Chronic cough: Secondary | ICD-10-CM

## 2024-10-31 LAB — URINALYSIS, ROUTINE W REFLEX MICROSCOPIC
Bilirubin Urine: NEGATIVE
Glucose, UA: NEGATIVE mg/dL
Hgb urine dipstick: NEGATIVE
Ketones, ur: NEGATIVE mg/dL
Leukocytes,Ua: NEGATIVE
Nitrite: NEGATIVE
Protein, ur: NEGATIVE mg/dL
Specific Gravity, Urine: <1.005 — ABNORMAL LOW (ref 1.005–1.030)
pH: 7 (ref 5.0–8.0)

## 2024-10-31 LAB — COMPREHENSIVE METABOLIC PANEL WITH GFR
ALT: 25 U/L (ref 0–44)
AST: 37 U/L (ref 15–41)
Albumin: 4.3 g/dL (ref 3.5–5.0)
Alkaline Phosphatase: 81 U/L (ref 38–126)
Anion gap: 10 (ref 5–15)
BUN: 8 mg/dL (ref 6–20)
CO2: 29 mmol/L (ref 22–32)
Calcium: 10 mg/dL (ref 8.9–10.3)
Chloride: 102 mmol/L (ref 98–111)
Creatinine, Ser: 0.71 mg/dL (ref 0.44–1.00)
GFR, Estimated: >60 mL/min (ref >60)
Glucose, Bld: 100 mg/dL — ABNORMAL HIGH (ref 70–99)
Potassium: 4.4 mmol/L (ref 3.5–5.1)
Sodium: 141 mmol/L (ref 135–145)
Total Bilirubin: 0.6 mg/dL (ref 0.0–1.2)
Total Protein: 7.9 g/dL (ref 6.5–8.1)

## 2024-10-31 LAB — CBC WITH DIFFERENTIAL/PLATELET
Abs Immature Granulocytes: 0.01 K/uL (ref 0.00–0.07)
Basophils Absolute: 0 K/uL (ref 0.0–0.1)
Basophils Relative: 1 %
Eosinophils Absolute: 0.6 K/uL — ABNORMAL HIGH (ref 0.0–0.5)
Eosinophils Relative: 11 %
HCT: 44.6 % (ref 36.0–46.0)
Hemoglobin: 14.8 g/dL (ref 12.0–15.0)
Immature Granulocytes: 0 %
Lymphocytes Relative: 27 %
Lymphs Abs: 1.5 K/uL (ref 0.7–4.0)
MCH: 30.7 pg (ref 26.0–34.0)
MCHC: 33.2 g/dL (ref 30.0–36.0)
MCV: 92.5 fL (ref 80.0–100.0)
Monocytes Absolute: 0.8 K/uL (ref 0.1–1.0)
Monocytes Relative: 15 %
Neutro Abs: 2.6 K/uL (ref 1.7–7.7)
Neutrophils Relative %: 46 %
Platelets: 151 K/uL (ref 150–400)
RBC: 4.82 MIL/uL (ref 3.87–5.11)
RDW: 13.2 % (ref 11.5–15.5)
WBC: 5.6 K/uL (ref 4.0–10.5)
nRBC: 0 % (ref 0.0–0.2)

## 2024-10-31 LAB — D-DIMER, QUANTITATIVE: D-Dimer, Quant: 3.89 ug{FEU}/mL — ABNORMAL HIGH (ref 0.00–0.50)

## 2024-10-31 LAB — LIPASE, BLOOD: Lipase: 76 U/L — ABNORMAL HIGH (ref 11–51)

## 2024-10-31 MED ORDER — ACETAMINOPHEN 500 MG PO TABS
1000.0000 mg | ORAL_TABLET | Freq: Three times a day (TID) | ORAL | Status: DC
  Administered 2024-10-31 – 2024-11-04 (×11): 1000 mg via ORAL
  Filled 2024-10-31 (×11): qty 2

## 2024-10-31 MED ORDER — KETAMINE HCL 10 MG/ML IJ SOLN
18.0000 mg | Freq: Once | INTRAMUSCULAR | Status: AC
Start: 2024-10-31 — End: 2024-10-31
  Administered 2024-10-31: 18 mg via INTRAVENOUS
  Filled 2024-10-31: qty 1

## 2024-10-31 MED ORDER — ENOXAPARIN SODIUM 40 MG/0.4ML IJ SOSY
40.0000 mg | PREFILLED_SYRINGE | INTRAMUSCULAR | Status: DC
  Administered 2024-10-31 – 2024-11-03 (×4): 40 mg via SUBCUTANEOUS
  Filled 2024-10-31 (×4): qty 0.4

## 2024-10-31 MED ORDER — LIDOCAINE 5 % EX PTCH
1.0000 | MEDICATED_PATCH | CUTANEOUS | 0 refills | Status: DC
  Filled 2024-10-31: qty 30, 30d supply, fill #0

## 2024-10-31 MED ORDER — METHOCARBAMOL 500 MG PO TABS
250.0000 mg | ORAL_TABLET | Freq: Two times a day (BID) | ORAL | 0 refills | Status: DC
  Filled 2024-10-31: qty 20, 20d supply, fill #0

## 2024-10-31 MED ORDER — CELECOXIB 200 MG PO CAPS
200.0000 mg | ORAL_CAPSULE | Freq: Once | ORAL | Status: DC
Start: 2024-10-31 — End: 2024-10-31
  Filled 2024-10-31: qty 1

## 2024-10-31 MED ORDER — IOHEXOL 350 MG/ML SOLN
75.0000 mL | Freq: Once | INTRAVENOUS | Status: AC | PRN
  Administered 2024-10-31: 75 mL via INTRAVENOUS

## 2024-10-31 MED ORDER — CELECOXIB 200 MG PO CAPS
200.0000 mg | ORAL_CAPSULE | Freq: Two times a day (BID) | ORAL | 0 refills | Status: DC
  Filled 2024-10-31: qty 28, 14d supply, fill #0

## 2024-10-31 MED ORDER — METHOCARBAMOL 500 MG PO TABS
500.0000 mg | ORAL_TABLET | Freq: Three times a day (TID) | ORAL | Status: DC | PRN
Start: 2024-10-31 — End: 2024-11-02
  Administered 2024-11-01 – 2024-11-02 (×2): 500 mg via ORAL
  Filled 2024-10-31 (×2): qty 1

## 2024-10-31 MED ORDER — IPRATROPIUM-ALBUTEROL 0.5-2.5 (3) MG/3ML IN SOLN
3.0000 mL | Freq: Once | RESPIRATORY_TRACT | Status: AC
  Administered 2024-10-31: 3 mL via RESPIRATORY_TRACT
  Filled 2024-10-31: qty 3

## 2024-10-31 MED ORDER — ONDANSETRON HCL 4 MG/2ML IJ SOLN
4.0000 mg | Freq: Once | INTRAMUSCULAR | Status: AC
  Administered 2024-10-31: 4 mg via INTRAVENOUS
  Filled 2024-10-31: qty 2

## 2024-10-31 MED ORDER — HYDROMORPHONE HCL 1 MG/ML IJ SOLN
0.5000 mg | Freq: Once | INTRAMUSCULAR | Status: AC
  Administered 2024-10-31: 0.5 mg via INTRAVENOUS
  Filled 2024-10-31: qty 1

## 2024-10-31 MED ORDER — PANTOPRAZOLE SODIUM 40 MG PO TBEC
40.0000 mg | DELAYED_RELEASE_TABLET | Freq: Every morning | ORAL | Status: DC
  Administered 2024-11-01 – 2024-11-04 (×4): 40 mg via ORAL
  Filled 2024-10-31 (×4): qty 1

## 2024-10-31 MED ORDER — ACETAMINOPHEN 500 MG PO TABS
1000.0000 mg | ORAL_TABLET | Freq: Once | ORAL | Status: AC
  Administered 2024-10-31: 1000 mg via ORAL
  Filled 2024-10-31: qty 2

## 2024-10-31 MED ORDER — ALBUTEROL SULFATE (2.5 MG/3ML) 0.083% IN NEBU
2.5000 mg | INHALATION_SOLUTION | Freq: Four times a day (QID) | RESPIRATORY_TRACT | Status: DC | PRN
Start: 2024-10-31 — End: 2024-10-31
  Administered 2024-10-31: 2.5 mg via RESPIRATORY_TRACT
  Filled 2024-10-31: qty 3

## 2024-10-31 MED ORDER — KETOROLAC TROMETHAMINE 15 MG/ML IJ SOLN
15.0000 mg | Freq: Three times a day (TID) | INTRAMUSCULAR | Status: AC | PRN
  Administered 2024-11-01 (×3): 15 mg via INTRAVENOUS
  Filled 2024-10-31 (×3): qty 1

## 2024-10-31 MED ORDER — IOHEXOL 300 MG/ML  SOLN
100.0000 mL | Freq: Once | INTRAMUSCULAR | Status: AC | PRN
  Administered 2024-10-31: 100 mL via INTRAVENOUS

## 2024-10-31 MED ORDER — ALBUTEROL SULFATE (2.5 MG/3ML) 0.083% IN NEBU
2.5000 mg | INHALATION_SOLUTION | Freq: Four times a day (QID) | RESPIRATORY_TRACT | Status: DC | PRN
  Administered 2024-11-01: 2.5 mg via RESPIRATORY_TRACT
  Filled 2024-10-31: qty 3

## 2024-10-31 MED ORDER — IPRATROPIUM-ALBUTEROL 0.5-2.5 (3) MG/3ML IN SOLN: 3.0000 mL | Freq: Four times a day (QID) | RESPIRATORY_TRACT | Status: DC | PRN

## 2024-10-31 MED ORDER — IOHEXOL 350 MG/ML SOLN: 100.0000 mL | Freq: Once | INTRAVENOUS | Status: DC | PRN

## 2024-10-31 MED ORDER — BENZONATATE 100 MG PO CAPS
100.0000 mg | ORAL_CAPSULE | Freq: Three times a day (TID) | ORAL | 0 refills | Status: DC
  Filled 2024-10-31: qty 30, 10d supply, fill #0

## 2024-10-31 MED ORDER — FAMOTIDINE 20 MG PO TABS
20.0000 mg | ORAL_TABLET | Freq: Every day | ORAL | Status: DC
  Administered 2024-10-31 – 2024-11-04 (×5): 20 mg via ORAL
  Filled 2024-10-31 (×5): qty 1

## 2024-10-31 MED ORDER — METHOCARBAMOL 500 MG PO TABS: 250.0000 mg | ORAL_TABLET | Freq: Once | ORAL | Status: DC

## 2024-10-31 MED ORDER — HYDROCOD POLI-CHLORPHE POLI ER 10-8 MG/5ML PO SUER
5.0000 mL | Freq: Two times a day (BID) | ORAL | Status: DC | PRN
Start: 2024-10-31 — End: 2024-11-04
  Administered 2024-10-31 – 2024-11-04 (×3): 5 mL via ORAL
  Filled 2024-10-31 (×3): qty 5

## 2024-10-31 MED ORDER — LIDOCAINE 5 % EX PTCH
1.0000 | MEDICATED_PATCH | CUTANEOUS | Status: DC
  Administered 2024-10-31 – 2024-11-03 (×4): 1 via TRANSDERMAL
  Filled 2024-10-31 (×4): qty 1

## 2024-10-31 MED ORDER — BUDESONIDE 0.25 MG/2ML IN SUSP
0.2500 mg | Freq: Two times a day (BID) | RESPIRATORY_TRACT | Status: DC
  Administered 2024-11-01 – 2024-11-02 (×4): 0.25 mg via RESPIRATORY_TRACT
  Filled 2024-10-31 (×5): qty 2

## 2024-10-31 MED ORDER — ALBUTEROL SULFATE (2.5 MG/3ML) 0.083% IN NEBU: 2.5000 mg | INHALATION_SOLUTION | RESPIRATORY_TRACT | Status: DC | PRN

## 2024-10-31 NOTE — ED Notes (Signed)
 PT was ambulated in the hall with slightly unsteady gait and no assistance. PT stopped due to acute onset of pain in the right knee. PT was amble to maintain balance without assistance. Pulse ox read 89-90% throughout ambulation.

## 2024-10-31 NOTE — ED Notes (Signed)
 Called [facility] to transport the patient to [facility] rm# [id number]

## 2024-10-31 NOTE — ED Notes (Signed)
Report given to [facility]  

## 2024-10-31 NOTE — H&P (Signed)
 History and Physical    Amy Flowers:[id number] DOB: 1966/03/15 DOA: 10/31/2024  PCP: Amy Clam, MD  Patient coming from: [facility] ED  Chief Complaint: Cough  HPI: Amy Flowers is a 58 y.o. [place] female with medical history significant of aortic atherosclerosis, esophageal dysmotility/GERD, hiatal hernia, vitamin D  deficiency.  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  She was noted to have a diffuse nonblanchable purpuric rash, ESR and ANA negative, and was advised to follow-up with dermatology on an outpatient basis.  During this hospitalization, patient had recurrent episodes of decreased responsiveness and exact etiology was unclear (see discharge summary).  Patient with history of severe refractory cough since August 2025.  She is a never smoker.  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  She was seen at pulmonology office on 10/27 for her chronic cough and was started on treatment for GERD (pantoprazole  and famotidine ) and was given Depo-Medrol  120 mg IM.  In addition, she was prescribed tramadol , Pulmicort  and albuterol  neb.  Patient transferred to [facility] tonight from [facility] for evaluation of severe chronic cough leading to right-sided pleuritic chest/rib pain.  Patient speaks Arabic and declined language interpreter.  Interpretation done by her daughter at bedside who speaks English.  Daughter states patient has had severe persistent cough and shortness of breath for the past 3 months.  Cough is productive of white sputum and no hemoptysis reported.  No improvement of her cough despite taking all the medications prescribed to her by pulmonology including medications for GERD and Pulmicort  and albuterol  neb.  Over the past 2 days patient is having severe right  posterior lower rib pain.  No falls or trauma reported.  No fevers reported.  She has never smoked cigarettes.  No other complaints.  ED Course: Not hypoxic initially but after she was given IV opiates for pain, she desatted to 89% on room air and was placed on 2 L [place].  Afebrile, not tachycardic or tachypneic.  Not hypotensive.  No significant abnormalities on CBC and CMP.  Lipase 76, D-dimer 3.89, UA not suggestive of infection.  CT abdomen pelvis showing no acute intra-abdominal or pelvic abnormality.  Chest x-ray showing similar changes of underlying interstitial lung disease and no pneumonia or pulmonary edema.  Medications administered in the ED include Tylenol , Dilaudid , DuoNeb, ketamine , lidocaine  patch, and Zofran .  Review of Systems:  Review of Systems  All other systems reviewed and are negative.   Past Medical History:  Diagnosis Date   Aortic atherosclerosis 06/26/2017   GERD (gastroesophageal reflux disease) 02/14/2019   Hiatal hernia 02/14/2019   Vitamin D  deficiency 09/28/2017    Past Surgical History:  Procedure Laterality Date   APPENDECTOMY     DIAPHRAGMATIC HERNIA REPAIR       reports that she has never smoked. She has never been exposed to tobacco smoke. She has never used smokeless tobacco. She reports that she does not drink alcohol and does not use drugs.  Allergies  Allergen Reactions   Cyclobenzaprine  Hives and Itching    History reviewed. No pertinent family history.  Prior to Admission medications   Medication Sig Start Date End Date Taking? Authorizing Provider  benzonatate  (TESSALON ) 100 MG capsule Take 1 capsule (100 mg total) by mouth every 8 (eight) hours. 10/31/24  Yes Hinnant, Collin F, PA-C  celecoxib  (CELEBREX ) 200 MG  capsule Take 1 capsule (200 mg total) by mouth 2 (two) times daily for 14 days. 10/31/24 11/14/24 Yes Hinnant, Collin F, PA-C  lidocaine  (LIDODERM ) 5 % Place 1 patch onto the skin daily. Remove & Discard patch within 12 hours or as  directed by MD 10/31/24  Yes Hinnant, Collin F, PA-C  methocarbamol  (ROBAXIN ) 500 MG tablet Take 0.5 tablets (250 mg total) by mouth 2 (two) times daily. 10/31/24  Yes Hinnant, Collin F, PA-C  albuterol  (PROVENTIL ) (2.5 MG/3ML) 0.083% nebulizer solution Take 3 mLs (2.5 mg total) by nebulization every 6 (six) hours as needed for wheezing or shortness of breath. 10/19/24   Darlean Ozell NOVAK, MD  budesonide  (PULMICORT ) 0.25 MG/2ML nebulizer solution Combine with albuterol  in nebulizer twice daily 10/03/24   Wert, Michael B, MD  famotidine  (PEPCID ) 20 MG tablet Take 1 tablet (20 mg total) by mouth daily. 10/03/24   Darlean Ozell NOVAK, MD  fluticasone -salmeterol (ADVAIR ) 500-50 MCG/ACT AEPB Inhale 1 puff into the lungs in the morning and at bedtime. 10/06/24 02/03/25  Mannam, Praveen, MD  pantoprazole  (PROTONIX ) 40 MG tablet Take 1 tablet (40 mg total) by mouth daily. Take 30-60 min before first meal of the day 10/03/24   Darlean Ozell NOVAK, MD  promethazine -dextromethorphan (PROMETHAZINE -DM) 6.25-15 MG/5ML syrup Take 5 mLs by mouth at bedtime as needed for cough. 10/03/24   Darlean Ozell NOVAK, MD  tiZANidine  (ZANAFLEX ) 4 MG tablet Take 1 tablet (4 mg total) by mouth every 8 (eight) hours as needed. Patient not taking: Reported on 10/03/2024 08/11/24   Amy Clam, MD    Physical Exam: Vitals:   10/31/24 1717 10/31/24 1730 10/31/24 1830 10/31/24 1917  BP:  (!) 104/57 131/65 (!) 142/68  Pulse:  78 85 83  Resp:  16 16 20   Temp: 98.4 F (36.9 C)   97.8 F (36.6 C)  TempSrc: Oral     SpO2:  93% 94% 93%    Physical Exam Vitals reviewed.  Constitutional:      General: She is not in acute distress. HENT:     Head: Normocephalic and atraumatic.  Eyes:     Extraocular Movements: Extraocular movements intact.  Cardiovascular:     Rate and Rhythm: Normal rate and regular rhythm.     Heart sounds: Normal heart sounds.  Pulmonary:     Effort: Pulmonary effort is normal. No respiratory distress.     Breath  sounds: Rhonchi present.  Abdominal:     General: Bowel sounds are normal.     Palpations: Abdomen is soft.     Tenderness: There is no abdominal tenderness. There is no guarding.  Musculoskeletal:     Cervical back: Normal range of motion.     Right lower leg: No edema.     Left lower leg: No edema.     Comments: Right lateral posterior lower ribs tender to palpation  Skin:    General: Skin is warm and dry.  Neurological:     General: No focal deficit present.     Mental Status: She is alert and oriented to person, place, and time.     Labs on Admission: I have personally reviewed following labs and imaging studies  CBC: Recent Labs  Lab 10/31/24 1010  WBC 5.6  NEUTROABS 2.6  HGB 14.8  HCT 44.6  MCV 92.5  PLT 151   Basic Metabolic Panel: Recent Labs  Lab 10/31/24 1010  NA 141  K 4.4  CL 102  CO2 29  GLUCOSE 100*  BUN  8  CREATININE 0.71  CALCIUM 10.0   GFR: CrCl cannot be calculated (Unknown ideal weight.). Liver Function Tests: Recent Labs  Lab 10/31/24 1010  AST 37  ALT 25  ALKPHOS 81  BILITOT 0.6  PROT 7.9  ALBUMIN 4.3   Recent Labs  Lab 10/31/24 1010  LIPASE 76*   No results for input(s): AMMONIA in the last 168 hours. Coagulation Profile: No results for input(s): INR, PROTIME in the last 168 hours. Cardiac Enzymes: No results for input(s): CKTOTAL, CKMB, CKMBINDEX, TROPONINI in the last 168 hours. BNP (last 3 results) No results for input(s): PROBNP in the last 8760 hours. HbA1C: No results for input(s): HGBA1C in the last 72 hours. CBG: No results for input(s): GLUCAP in the last 168 hours. Lipid Profile: No results for input(s): CHOL, HDL, LDLCALC, TRIG, CHOLHDL, LDLDIRECT in the last 72 hours. Thyroid Function Tests: No results for input(s): TSH, T4TOTAL, FREET4, T3FREE, THYROIDAB in the last 72 hours. Anemia Panel: No results for input(s): VITAMINB12, FOLATE, FERRITIN, TIBC,  IRON, RETICCTPCT in the last 72 hours. Urine analysis:    Component Value Date/Time   COLORURINE COLORLESS (A) 10/31/2024 0924   APPEARANCEUR CLEAR 10/31/2024 0924   LABSPEC <1.005 (L) 10/31/2024 0924   PHURINE 7.0 10/31/2024 0924   GLUCOSEU NEGATIVE 10/31/2024 0924   HGBUR NEGATIVE 10/31/2024 0924   BILIRUBINUR NEGATIVE 10/31/2024 0924   KETONESUR NEGATIVE 10/31/2024 0924   PROTEINUR NEGATIVE 10/31/2024 0924   NITRITE NEGATIVE 10/31/2024 0924   LEUKOCYTESUR NEGATIVE 10/31/2024 0924    Radiological Exams on Admission: DG Chest 2 View Result Date: 10/31/2024 EXAM: 2 VIEW(S) XRAY OF THE CHEST 10/31/2024 11:23:22 AM COMPARISON: 09/06/2024, 09/21/2024 CLINICAL HISTORY: cough FINDINGS: LUNGS AND PLEURA: Coarsening of the pulmonary interstitium, with a peripheral and bibasilar predominance, as better seen on the comparison chest CT. No pleural effusion. No pneumothorax. HEART AND MEDIASTINUM: No acute abnormality of the cardiac and mediastinal silhouettes. BONES AND SOFT TISSUES: Degenerative disc disease of the visualized spine. No acute osseous abnormality. IMPRESSION: 1. Similar changes of underlying interstitial lung disease, better visualized on the comparison chest CT. No pneumonia or pulmonary edema. Electronically signed by: Rogelia Myers MD 10/31/2024 11:46 AM EST RP Workstation: [id number]   CT ABDOMEN PELVIS W CONTRAST Result Date: 10/31/2024 EXAM: CT ABDOMEN AND PELVIS WITH CONTRAST 10/31/2024 11:07:21 AM TECHNIQUE: CT of the abdomen and pelvis was performed with the administration of 100 mL of iohexol  (OMNIPAQUE ) 300 MG/ML solution. Multiplanar reformatted images are provided for review. Automated exposure control, iterative reconstruction, and/or weight-based adjustment of the mA/kV was utilized to reduce the radiation dose to as low as reasonably achievable. COMPARISON: 09/27/2023 CLINICAL HISTORY: RLQ abdominal pain, known previous appendectomy, R CVA tenderness. FINDINGS:  LOWER CHEST: Posterior bibasilar dependent atelectasis. LIVER: The liver is unremarkable. GALLBLADDER AND BILE DUCTS: Gallbladder is unremarkable. No biliary ductal dilatation. SPLEEN: No acute abnormality. PANCREAS: No acute abnormality. ADRENAL GLANDS: No acute abnormality. KIDNEYS, URETERS AND BLADDER: No stones in the kidneys or ureters. No hydronephrosis. No perinephric or periureteral stranding. Urinary bladder is unremarkable. GI AND BOWEL: Small hiatal hernia. Decompressed stomach. The appendix is not visualized, reportedly surgically absent. There is no bowel obstruction. PERITONEUM AND RETROPERITONEUM: No ascites. No free air. VASCULATURE: Aorta is normal in caliber. LYMPH NODES: No lymphadenopathy. REPRODUCTIVE ORGANS: Age-related atrophy of the uterus and ovaries. BONES AND SOFT TISSUES: No acute osseous abnormality. No focal soft tissue abnormality. Multilevel degenerative changes of the thoracolumbar spine. IMPRESSION: 1. No acute intra-abdominal or pelvic abnormality. . Electronically  signed by: Rogelia Myers MD 10/31/2024 11:37 AM EST RP Workstation: [id number]   EKG: Pending at this time.  Assessment and Plan  Severe persistent chronic cough leading to right-sided rib pain History of interstitial lung disease/usual interstitial pneumonitis No respiratory distress.  She was not hypoxic on arrival to the ED but later desatted to 89% on room air in the ED after receiving IV opiates for pain and was placed on 2 L [place] initially but now satting well on room air.  Her pain appears to be musculoskeletal in nature and easily reproducible on exam on palpation of right posterior lateral lower ribs.  No falls or trauma reported.  Chest x-ray negative for acute findings.  However, D-dimer elevated, so CT angiogram chest has been ordered to rule out PE.  Continue Pulmicort  neb twice daily, albuterol  neb every 6 hours PRN. For pain: Scheduled Tylenol , lidocaine  patch, Toradol  PRN, and Robaxin  PRN.   Tussionex PRN severe cough.  Esophageal dysmotility/GERD Continue pantoprazole  and famotidine .  DVT prophylaxis: Lovenox  Code Status: Full Code (discussed with the patient and her daughter) Family Communication: Daughter at bedside. Level of care: Med-Surg Admission status: It is my clinical opinion that referral for OBSERVATION is reasonable and necessary in this patient based on the above information provided. The aforementioned taken together are felt to place the patient at high risk for further clinical deterioration. However, it is anticipated that the patient may be medically stable for discharge from the hospital within 24 to 48 hours.  Editha Ram MD [facility]  If 7PM-7AM, please contact night-coverage [email or web address]  10/31/2024, 7:32 PM

## 2024-10-31 NOTE — ED Notes (Signed)
 ED Provider at bedside.

## 2024-10-31 NOTE — ED Provider Notes (Signed)
 [facility] EMERGENCY DEPARTMENT AT [facility] Provider Note   CSN: [id number] Arrival date & time: 10/31/24  9164     Patient presents with: Right Side Pain   Amy Flowers is a 58 y.o. female who presents to the emergency department with a chief complaint of right sided abdominal pain that is primarily near the bottom of her right rib cage. Patient states that the pain started on Saturday and was sudden onset. Patient does state that she was coughing quite a bit prior to onset of pain.  Denies fever but does appreciate some chills.  Denies chest pain or shortness of breath different from baseline.  Patient does have a history of chronic cough associated with history of pulmonary fibrosis and states that she has been coughing consistently for 2 months and at times it is productive.  Per chart review patient is currently seeing pulmonology for this.  Denies nausea or vomiting.  Denies urinary symptoms.  Patient reports that she has had a previous appendectomy.  Pain is worse with movement and with coughing.  Past medical history significant for aortic atherosclerosis, GERD, angioedema, appendectomy, diaphragmatic hernia repair, pulmonary fibrosis, etc. Denies known trauma or injury.  Patient was offered translator for encounter but deferred stating that her daughter at bedside could act as nurse, learning disability.    HPI     Prior to Admission medications   Medication Sig Start Date End Date Taking? Authorizing Provider  albuterol  (PROVENTIL ) (2.5 MG/3ML) 0.083% nebulizer solution Take 3 mLs (2.5 mg total) by nebulization every 6 (six) hours as needed for wheezing or shortness of breath. 10/19/24  Yes Darlean Ozell NOVAK, MD  benzonatate  (TESSALON ) 100 MG capsule Take 1 capsule (100 mg total) by mouth every 8 (eight) hours. 10/31/24  Yes Andreana Klingerman F, PA-C  celecoxib  (CELEBREX ) 200 MG capsule Take 1 capsule (200 mg total) by mouth 2 (two) times daily for 14 days. 10/31/24 11/14/24 Yes Scarlettrose Costilow,  Tomoki Lucken F, PA-C  famotidine  (PEPCID ) 20 MG tablet Take 1 tablet (20 mg total) by mouth daily. 10/03/24  Yes Darlean Ozell NOVAK, MD  lidocaine  (LIDODERM ) 5 % Place 1 patch onto the skin daily. Remove & Discard patch within 12 hours or as directed by MD 10/31/24  Yes Isam Unrein F, PA-C  methocarbamol  (ROBAXIN ) 500 MG tablet Take 0.5 tablets (250 mg total) by mouth 2 (two) times daily. 10/31/24  Yes Kholton Coate F, PA-C  pantoprazole  (PROTONIX ) 40 MG tablet Take 1 tablet (40 mg total) by mouth daily. Take 30-60 min before first meal of the day 10/03/24  Yes Darlean Ozell NOVAK, MD  budesonide  (PULMICORT ) 0.25 MG/2ML nebulizer solution Combine with albuterol  in nebulizer twice daily Patient not taking: Reported on 10/31/2024 10/03/24   Wert, Michael B, MD  fluticasone -salmeterol (ADVAIR ) 500-50 MCG/ACT AEPB Inhale 1 puff into the lungs in the morning and at bedtime. Patient not taking: Reported on 10/31/2024 10/06/24 02/03/25  Mannam, Praveen, MD  promethazine -dextromethorphan (PROMETHAZINE -DM) 6.25-15 MG/5ML syrup Take 5 mLs by mouth at bedtime as needed for cough. Patient not taking: Reported on 10/31/2024 10/03/24   Darlean Ozell NOVAK, MD  tiZANidine  (ZANAFLEX ) 4 MG tablet Take 1 tablet (4 mg total) by mouth every 8 (eight) hours as needed. Patient not taking: No sig reported 08/11/24   Delbert Clam, MD    Allergies: Cyclobenzaprine     Review of Systems  Gastrointestinal:  Positive for abdominal pain (RUQ area, specifically over lower rib area).    Updated Vital Signs BP (!) 142/68 (BP Location: Left  Arm)   Pulse 83   Temp 97.8 F (36.6 C) (Oral)   Resp 20   SpO2 93%   Physical Exam Vitals and nursing note reviewed.  Constitutional:      General: She is awake. She is not in acute distress.    Appearance: Normal appearance. She is not ill-appearing, toxic-appearing or diaphoretic.     Comments: Patients pain appeared to be controlled when lying in bed however with any movement patient  calling out in pain, also reports pain when taking deep breaths or coughing   HENT:     Head: Normocephalic and atraumatic.  Eyes:     General: No scleral icterus. Cardiovascular:     Rate and Rhythm: Normal rate and regular rhythm.  Pulmonary:     Effort: Pulmonary effort is normal. No respiratory distress.     Breath sounds: Wheezing present. No rhonchi or rales.     Comments: Patient talking in full sentences on room air  Abdominal:     General: Abdomen is flat. There is no distension.     Palpations: Abdomen is soft.     Tenderness: There is abdominal tenderness (RUQ and R flank tender with palpation, specifically tender around lower right rib cage as well wrapping around toward back). There is right CVA tenderness. There is no left CVA tenderness or guarding.  Musculoskeletal:        General: Normal range of motion.     Cervical back: Normal range of motion.     Right lower leg: No edema.     Left lower leg: No edema.     Comments: Grossly normal ROM of all 4 extremities, no tenderness with palpation of pelvis, R hip, or RLE  Skin:    General: Skin is warm.     Capillary Refill: Capillary refill takes less than 2 seconds.  Neurological:     General: No focal deficit present.     Mental Status: She is alert and oriented to person, place, and time.  Psychiatric:        Mood and Affect: Mood normal.        Behavior: Behavior normal. Behavior is cooperative.     (all labs ordered are listed, but only abnormal results are displayed) Labs Reviewed  CBC WITH DIFFERENTIAL/PLATELET - Abnormal; Notable for the following components:      Result Value   Eosinophils Absolute 0.6 (*)    All other components within normal limits  COMPREHENSIVE METABOLIC PANEL WITH GFR - Abnormal; Notable for the following components:   Glucose, Bld 100 (*)    All other components within normal limits  URINALYSIS, ROUTINE W REFLEX MICROSCOPIC - Abnormal; Notable for the following components:   Color,  Urine COLORLESS (*)    Specific Gravity, Urine <1.005 (*)    All other components within normal limits  LIPASE, BLOOD - Abnormal; Notable for the following components:   Lipase 76 (*)    All other components within normal limits  D-DIMER, QUANTITATIVE - Abnormal; Notable for the following components:   D-Dimer, Quant 3.89 (*)    All other components within normal limits  HIV ANTIBODY (ROUTINE TESTING W REFLEX)    EKG: None  Radiology: CT Angio Chest Pulmonary Embolism (PE) W or WO Contrast Result Date: 10/31/2024 EXAM: CTA CHEST AORTA 10/31/2024 10:20:44 PM TECHNIQUE: CTA of the chest was performed without and with the administration of 75 mL of iohexol  (OMNIPAQUE ) 350 MG/ML injection. Multiplanar reformatted images are provided for review. MIP images are  provided for review. Automated exposure control, iterative reconstruction, and/or weight based adjustment of the mA/kV was utilized to reduce the radiation dose to as low as reasonably achievable. COMPARISON: 09/21/2024. CLINICAL HISTORY: Pulmonary embolism (PE) suspected, high prob. FINDINGS: AORTA: No thoracic aortic dissection. No aneurysm. MEDIASTINUM: No mediastinal lymphadenopathy. The heart and pericardium demonstrate no acute abnormality. LYMPH NODES: No mediastinal, hilar or axillary lymphadenopathy. LUNGS AND PLEURA: Airway thickening in the lower lobes bilaterally. Peripheral and basilar predominant coarsened ground glass and subpleural reticular densities are again noted, unchanged. No focal consolidation or pulmonary edema. No pleural effusion or pneumothorax. UPPER ABDOMEN: Small hiatal hernia. SOFT TISSUES AND BONES: No acute bone or soft tissue abnormality. IMPRESSION: 1. No evidence of pulmonary embolism. 2. Peripheral and basilar predominant coarsened ground glass and subpleural reticular densities, unchanged. . Findings compatible with chronic interstitial lung disease. 3. Airway thickening in the lower lobes is compatible with  bronchitis. Electronically signed by: Franky Crease MD 10/31/2024 10:34 PM EST RP Workstation: [id number]   DG Chest 2 View Result Date: 10/31/2024 EXAM: 2 VIEW(S) XRAY OF THE CHEST 10/31/2024 11:23:22 AM COMPARISON: 09/06/2024, 09/21/2024 CLINICAL HISTORY: cough FINDINGS: LUNGS AND PLEURA: Coarsening of the pulmonary interstitium, with a peripheral and bibasilar predominance, as better seen on the comparison chest CT. No pleural effusion. No pneumothorax. HEART AND MEDIASTINUM: No acute abnormality of the cardiac and mediastinal silhouettes. BONES AND SOFT TISSUES: Degenerative disc disease of the visualized spine. No acute osseous abnormality. IMPRESSION: 1. Similar changes of underlying interstitial lung disease, better visualized on the comparison chest CT. No pneumonia or pulmonary edema. Electronically signed by: Rogelia Myers MD 10/31/2024 11:46 AM EST RP Workstation: [id number]   CT ABDOMEN PELVIS W CONTRAST Result Date: 10/31/2024 EXAM: CT ABDOMEN AND PELVIS WITH CONTRAST 10/31/2024 11:07:21 AM TECHNIQUE: CT of the abdomen and pelvis was performed with the administration of 100 mL of iohexol  (OMNIPAQUE ) 300 MG/ML solution. Multiplanar reformatted images are provided for review. Automated exposure control, iterative reconstruction, and/or weight-based adjustment of the mA/kV was utilized to reduce the radiation dose to as low as reasonably achievable. COMPARISON: 09/27/2023 CLINICAL HISTORY: RLQ abdominal pain, known previous appendectomy, R CVA tenderness. FINDINGS: LOWER CHEST: Posterior bibasilar dependent atelectasis. LIVER: The liver is unremarkable. GALLBLADDER AND BILE DUCTS: Gallbladder is unremarkable. No biliary ductal dilatation. SPLEEN: No acute abnormality. PANCREAS: No acute abnormality. ADRENAL GLANDS: No acute abnormality. KIDNEYS, URETERS AND BLADDER: No stones in the kidneys or ureters. No hydronephrosis. No perinephric or periureteral stranding. Urinary bladder is unremarkable. GI  AND BOWEL: Small hiatal hernia. Decompressed stomach. The appendix is not visualized, reportedly surgically absent. There is no bowel obstruction. PERITONEUM AND RETROPERITONEUM: No ascites. No free air. VASCULATURE: Aorta is normal in caliber. LYMPH NODES: No lymphadenopathy. REPRODUCTIVE ORGANS: Age-related atrophy of the uterus and ovaries. BONES AND SOFT TISSUES: No acute osseous abnormality. No focal soft tissue abnormality. Multilevel degenerative changes of the thoracolumbar spine. IMPRESSION: 1. No acute intra-abdominal or pelvic abnormality. . Electronically signed by: Rogelia Myers MD 10/31/2024 11:37 AM EST RP Workstation: [id number]     Procedures   Medications Ordered in the ED  lidocaine  (LIDODERM ) 5 % 1 patch (1 patch Transdermal Patch Applied 10/31/24 1402)  chlorpheniramine-HYDROcodone (TUSSIONEX) 10-8 MG/5ML suspension 5 mL (5 mLs Oral Given 10/31/24 2156)  acetaminophen  (TYLENOL ) tablet 1,000 mg (1,000 mg Oral Given 10/31/24 2155)  albuterol  (PROVENTIL ) (2.5 MG/3ML) 0.083% nebulizer solution 2.5 mg (has no administration in time range)  budesonide  (PULMICORT ) nebulizer solution 0.25 mg (0.25 mg Nebulization Not Given 10/31/24  2240)  famotidine  (PEPCID ) tablet 20 mg (20 mg Oral Given 10/31/24 2155)  pantoprazole  (PROTONIX ) EC tablet 40 mg (has no administration in time range)  methocarbamol  (ROBAXIN ) tablet 500 mg (has no administration in time range)  enoxaparin  (LOVENOX ) injection 40 mg (40 mg Subcutaneous Given 10/31/24 2155)  ketorolac  (TORADOL ) 15 MG/ML injection 15 mg (has no administration in time range)  HYDROmorphone  (DILAUDID ) injection 0.5 mg (0.5 mg Intravenous Given 10/31/24 1015)  ondansetron  (ZOFRAN ) injection 4 mg (4 mg Intravenous Given 10/31/24 1014)  ipratropium-albuterol  (DUONEB) 0.5-2.5 (3) MG/3ML nebulizer solution 3 mL (3 mLs Nebulization Given 10/31/24 1036)  iohexol  (OMNIPAQUE ) 300 MG/ML solution 100 mL (100 mLs Intravenous Contrast Given 10/31/24 1101)   acetaminophen  (TYLENOL ) tablet 1,000 mg (1,000 mg Oral Given 10/31/24 1401)  ketamine  (KETALAR ) injection 18 mg (18 mg Intravenous Given 10/31/24 1619)  iohexol  (OMNIPAQUE ) 350 MG/ML injection 75 mL (75 mLs Intravenous Contrast Given 10/31/24 2221)    Clinical Course as of 10/31/24 2333  Mon Oct 31, 2024  1517 Patient desatted some after dilaudid , patient [place] O2 removed by myself and patient now at 95% on room air without O2 [CH]    Clinical Course User Index [CH] Arleen Bar, Terrall FALCON, PA-C                                 Medical Decision Making Amount and/or Complexity of Data Reviewed Labs: ordered. Radiology: ordered.  Risk OTC drugs. Prescription drug management. Decision regarding hospitalization.   Patient presents to the ED for concern of R sided abdominal vs. Flank pain, this involves an extensive number of treatment options, and is a complaint that carries with it a high risk of complications and morbidity.  The differential diagnosis includes nephrolithiasis, cholecystitis, pancreatitis, musculoskeletal pain, cracked rib, diverticulitis, UTI, PE, pneumonia, pulmonary fibrosis exacerbation, etc.   Co morbidities that complicate the patient evaluation  aortic atherosclerosis, GERD, angioedema, appendectomy, diaphragmatic hernia repair, pulmonary fibrosis   Additional history obtained:  Additional history reviewed including recent visits with pulmonology, I can see documented where patient has had a previous D-dimer test which was elevated and patient has recently had 2 CT PE scans which were both negative for acute pulmonary embolism, considered this in my diagnosis but per history appears that cough is unchanged from chronic nature   Lab Tests:  I Ordered, and personally interpreted labs.  The pertinent results include:  CBC unremarkable, CMP unremarkable, lipase is mildly elevated to 76 which appears chronic, urinalysis not consistent with infection   Imaging  Studies ordered:  I ordered imaging studies including chest x-ray, CT abdomen pelvis with contrast I independently visualized and interpreted imaging which showed: Chest x-ray: Changes consistent with interstitial lung disease, no evidence of acute pulmonary edema or pneumonia CT abdomen pelvis: No acute intra-abdominal or intrapelvic abnormality I agree with the radiologist interpretation   Medicines ordered and prescription drug management:  I ordered medication including dilaudid , ketamine , tylenol  for pain, zofran  for nausea, duoneb for wheezing Reevaluation of the patient after these medicines showed that the patient Stayed the same I have reviewed the patients home medicines and have made adjustments as needed   Test Considered:  CT PE scan: deferred at this time as patient is not tachycardic, not acutely short of breath, has hx of chronic cough following with pulm and has recently had two CT PE scans completed which were negative for acute PE   Critical Interventions:  none  Consultations Obtained:  I requested consultation with the hospitalist team and discussed lab and imaging findings as well as pertinent plan - they recommend: admission to the hospital for ongoing diagnosis and treatment as well as pain control   Problem List / ED Course:  58 year old female, preferred daughter to be translator during encounter, vital signs stable, presents to the emergency department for sudden onset right lower quadrant abdominal pain that began on Saturday, patient has history of previous appendectomy, no associated fever, no nausea or vomiting, what appears to be a chronic cough Notified after patient was roomed by respiratory that patient had generalized wheezing with lung auscultation, duoneb ordered On physical exam point tenderness to right lower quadrant specifically RUQ and inferior R ribcage, also painful with cough and movement, minimal right CVA tenderness Will obtain  generalized abdominal pain labs and imaging as well as chest xray, will treat symptomatically Patient wheezing improve after duoneb treatment which patient is also prescribed at home due to her history of pulmonary fibrosis  Initially attempted to control pain with IV narcotic medication however notified by nursing that this made the patient somewhat drowsy and patient ultimately needed to be placed on 2L [place] O2 due to oxygen saturations in the mid 80's. Even with IV narcotic pain medication only mild pain relief. Attempted pain relief with tylenol  and lidocaine  patch also without relief.  Workup overall reassuring with no evidence of liver, gallbladder disease, no evidence of kidney stone or pyelonephritis, suspicious for possible cracked rib due to point tenderness with palpation and history of chronic cough  Due to refractory pain and desaturation after IV narcotics, I do believe it is reasonable for admission for this patient at this time, upon reassessment patient's oxygenation has improved to 95% at rest on room air and patient ambulated with O2 saturation around 88-89%. Patient denies acute shortness of breath.  Spoke with Dr. Danton with hospitalist service who agrees with admission, pending transfer will trial ketamine  for analgesia Patient admitted Most likely diagnosis at this time is musculoskeletal rib pain on R side, possibly a cracked rib due to chronic cough, otherwise reassuring work-up Considered PE however patient appears to have a chronically elevated D-dimer and has had 2 negative PE studies over the past few months, patient denies acute SOB and is not tachycardic, CT PE study can be completed once inpatient if need be    Reevaluation:  After the interventions noted above, I reevaluated the patient and found that they have :stayed the same   Social Determinants of Health:  Non-english speaking   Dispostion:  After consideration of the diagnostic results and the patients  response to treatment, I feel that the patient would benefit from admission to the hospital for ongoing diagnosis and treatment   Initially considered discharge due to reassuring work-up however pain was unable to be controlled.      Final diagnoses:  Rib pain  Right upper quadrant abdominal pain  Chronic cough  Pulmonary fibrosis [facility])    ED Discharge Orders          Ordered    celecoxib  (CELEBREX ) 200 MG capsule  2 times daily        10/31/24 1550    lidocaine  (LIDODERM ) 5 %  Every 24 hours        10/31/24 1550    benzonatate  (TESSALON ) 100 MG capsule  Every 8 hours        10/31/24 1550    methocarbamol  (ROBAXIN ) 500 MG tablet  2 times  daily        10/31/24 1550               Janetta Terrall FALCON, PA-C 10/31/24 2338    Bari Roxie HERO, DO 11/03/24 1306

## 2024-10-31 NOTE — ED Triage Notes (Signed)
 Pt is having pain in her right side.  Pt has had a cough x 2 months and her side hurts when she coughs and when she moves. No pain or burning with urination.  No nausea or vomiting or fever.

## 2024-10-31 NOTE — Progress Notes (Signed)
Pt leaving for CT scan

## 2024-10-31 NOTE — ED Notes (Signed)
 Urine taken to lab by NT.[email or web address]

## 2024-10-31 NOTE — Plan of Care (Signed)
   I was asked to call [facility] regarding admission for this patient at [facility] ER.  I was connected with the PA caring for the patient who explained that she has a history of pulmonary fibrosis with a chronic cough that is proving quite difficult to manage.  She presented to the ER today with ongoing severe peroxisomal's of coughing newly associated with the acute onset of intractable severe right lower flank/rib pain.  Chest x-ray was without evidence of pneumothorax or other acute findings.  The patient was however wheezing on exam.  Nonnarcotic medications failed to improve her pain at all.  Use of narcotics led to transient hypoxia.  Given the ongoing difficulty managing her pain, and the inability to safely send her home on narcotics, she will be placed in observation so that we can attempt to control her pain more adequately as an inpatient before sending her home.  Vital signs were stable and she is afebrile.  She was accepted to a medical/surgical bed at [facility].  D-dimer is pending now as of initial evaluation to help rule out pulmonary embolism.  Amy IVAR Moores, MD [facility] Office  [phone]

## 2024-10-31 NOTE — ED Notes (Signed)
 Pt had O2 placed because of meds that were given... Pt taken off O2 after a few hours... Pt's O2 decreased Room Air... Pt placed back on the O2... Provider and RT informed.[email or web address]

## 2024-10-31 NOTE — ED Notes (Signed)
 Report given to the Floor RN.

## 2024-11-01 ENCOUNTER — Other Ambulatory Visit (HOSPITAL_COMMUNITY): Payer: Self-pay

## 2024-11-01 ENCOUNTER — Encounter: Payer: Self-pay | Admitting: Pharmacist

## 2024-11-01 ENCOUNTER — Observation Stay (HOSPITAL_COMMUNITY): Payer: Self-pay

## 2024-11-01 ENCOUNTER — Other Ambulatory Visit: Payer: Self-pay

## 2024-11-01 LAB — HIV ANTIBODY (ROUTINE TESTING W REFLEX): HIV Screen 4th Generation wRfx: NONREACTIVE

## 2024-11-01 MED ORDER — ORAL CARE MOUTH RINSE: 15.0000 mL | OROMUCOSAL | Status: DC | PRN

## 2024-11-01 NOTE — Plan of Care (Signed)
  Problem: Health Behavior/Discharge Planning: Goal: Ability to manage health-related needs will improve Outcome: Progressing   Problem: Clinical Measurements: Goal: Will remain free from infection Outcome: Progressing Goal: Diagnostic test results will improve Outcome: Progressing   Problem: Coping: Goal: Level of anxiety will decrease Outcome: Progressing   Problem: Pain Managment: Goal: General experience of comfort will improve and/or be controlled Outcome: Progressing

## 2024-11-01 NOTE — Progress Notes (Addendum)
 PROGRESS NOTE    Amy Flowers  FMW:[id number] DOB: Nov 26, 1966 DOA: 10/31/2024 PCP: Delbert Clam, MD   Brief Narrative:  This 58 yrs old [place]ese female with PMH significant for aortic atherosclerosis, esophageal dysmotility/GERD, hiatal hernia, vitamin D  deficiency presented in the ED for further evaluation of severe chronic cough leading to right-sided pleuritic chest and rib pain.  Daughter reports patient has severe persistent cough and shortness of breath for last 3 months.  She describes cough is productive of whitish sputum but no hemoptysis reported.There is no improvement in the cough despite taking all the prescribed medication by her pulmonologist including medications for GERD, Pulmicort  and albuterol  nebulization. Over the past 2 days patient is having severe right-sided lower rib pain.  She denies any fall or trauma.  She was not hypoxic initially but after she was given IV opioids for pain she desaturated to 89% on room air and required 2 L of supplemental oxygen.  Lipase is slightly elevated 76,  D-dimer 3.89.  UA negative for infection.  CT abdomen and pelvis no acute intraabdominal abnormality found.  Chest x-ray shows finding consistent with interstitial lung disease.  CTA chest ruled out pulmonary embolism.  Patient was admitted for further evaluation.  Assessment & Plan:   Principal Problem:   Rib pain on right side Active Problems:   GERD (gastroesophageal reflux disease)   Cough   UIP (usual interstitial pneumonitis) (HCC)   Severe persistent chronic cough leading to right-sided rib pain: History of interstitial lung disease / usual interstitial pneumonitis: Patient presented with persistent chronic cough leading to right-sided chest pain. No respiratory distress.  She was not hypoxic on arrival to the ED but later desatted to 89% on room air in the ED after receiving IV opiates for pain and was placed on 2 L [place] initially but now satting well on room air.  Her pain  appears to be musculoskeletal in nature and easily reproducible on exam on palpation of right posterior lateral lower ribs.  No falls or trauma reported.  Chest x-ray negative for acute findings.  However, D-dimer elevated, so CT angiogram chest was completed which ruled out PE. Continue Pulmicort  neb twice daily, albuterol  neb every 6 hours PRN. For pain: Scheduled Tylenol , lidocaine  patch, Toradol  PRN, and Robaxin  PRN.  Tussionex PRN severe cough. Pain appears musculoskeletal but doubt murphy sign positive. Obtain right quadrant ultrasound.   Esophageal dysmotility/GERD: Continue pantoprazole  and famotidine .  Elevated lipase: Lipase slightly elevated at 76.   CT does not show any pancreatitis. Continue IV hydration.Continue to trend.  ANA+ Vasculitis Labs were ordered from the ED. ANA positive other labs pending.  DVT prophylaxis: Lovenox  Code Status: Full code Family Communication: Daughter at bedside to help with translation. Disposition Plan:    Status is: Observation The patient remains OBS appropriate and will d/c before 2 midnights.  Admitted for chronic cough leading to right-sided pleuritic chest pain.  Consultants:  None  Procedures:   Antimicrobials:  Anti-infectives (From admission, onward)    None       Subjective: Patient was seen and examined at bedside. Overnight events noted. Patient lying comfortably on the bed,  has significant pain under right sided ribs.  Objective: Vitals:   11/01/24 0328 11/01/24 0332 11/01/24 0743 11/01/24 0854  BP: 116/63  114/67   Pulse: 73 72 80   Resp: (!) 24  16   Temp: 98 F (36.7 C)  98.1 F (36.7 C)   TempSrc:   Oral   SpO2: 92%  93% 93%  Weight: 67 kg     Height: 5' 1 (1.549 m)      No intake or output data in the 24 hours ending 11/01/24 1156 Filed Weights   11/01/24 0328  Weight: 67 kg    Examination:  General exam: Appears calm and comfortable, not in any acute distress. Respiratory system: Clear  to auscultation. Respiratory effort normal.  RR 15 Cardiovascular system: S1 & S2 heard, RRR. No JVD, murmurs, rubs, gallops or clicks.  Gastrointestinal system: Abdomen is non distended, soft  but RUQ tenderness +. Normal bowel sounds heard. Central nervous system: Alert and oriented X 3. No focal neurological deficits. Extremities: No edema, no cyanosis, no clubbing. Skin: No rashes, lesions or ulcers Psychiatry: Judgement and insight appear normal. Mood & affect appropriate.     Data Reviewed: I have personally reviewed following labs and imaging studies  CBC: Recent Labs  Lab 10/31/24 1010  WBC 5.6  NEUTROABS 2.6  HGB 14.8  HCT 44.6  MCV 92.5  PLT 151   Basic Metabolic Panel: Recent Labs  Lab 10/31/24 1010  NA 141  K 4.4  CL 102  CO2 29  GLUCOSE 100*  BUN 8  CREATININE 0.71  CALCIUM 10.0   GFR: Estimated Creatinine Clearance: 65.5 mL/min (by C-G formula based on SCr of 0.71 mg/dL). Liver Function Tests: Recent Labs  Lab 10/31/24 1010  AST 37  ALT 25  ALKPHOS 81  BILITOT 0.6  PROT 7.9  ALBUMIN 4.3   Recent Labs  Lab 10/31/24 1010  LIPASE 76*   No results for input(s): AMMONIA in the last 168 hours. Coagulation Profile: No results for input(s): INR, PROTIME in the last 168 hours. Cardiac Enzymes: No results for input(s): CKTOTAL, CKMB, CKMBINDEX, TROPONINI in the last 168 hours. BNP (last 3 results) No results for input(s): PROBNP in the last 8760 hours. HbA1C: No results for input(s): HGBA1C in the last 72 hours. CBG: No results for input(s): GLUCAP in the last 168 hours. Lipid Profile: No results for input(s): CHOL, HDL, LDLCALC, TRIG, CHOLHDL, LDLDIRECT in the last 72 hours. Thyroid Function Tests: No results for input(s): TSH, T4TOTAL, FREET4, T3FREE, THYROIDAB in the last 72 hours. Anemia Panel: No results for input(s): VITAMINB12, FOLATE, FERRITIN, TIBC, IRON, RETICCTPCT in the last 72  hours. Sepsis Labs: No results for input(s): PROCALCITON, LATICACIDVEN in the last 168 hours.  No results found for this or any previous visit (from the past 240 hours).   Radiology Studies: CT Angio Chest Pulmonary Embolism (PE) W or WO Contrast Result Date: 10/31/2024 EXAM: CTA CHEST AORTA 10/31/2024 10:20:44 PM TECHNIQUE: CTA of the chest was performed without and with the administration of 75 mL of iohexol  (OMNIPAQUE ) 350 MG/ML injection. Multiplanar reformatted images are provided for review. MIP images are provided for review. Automated exposure control, iterative reconstruction, and/or weight based adjustment of the mA/kV was utilized to reduce the radiation dose to as low as reasonably achievable. COMPARISON: 09/21/2024. CLINICAL HISTORY: Pulmonary embolism (PE) suspected, high prob. FINDINGS: AORTA: No thoracic aortic dissection. No aneurysm. MEDIASTINUM: No mediastinal lymphadenopathy. The heart and pericardium demonstrate no acute abnormality. LYMPH NODES: No mediastinal, hilar or axillary lymphadenopathy. LUNGS AND PLEURA: Airway thickening in the lower lobes bilaterally. Peripheral and basilar predominant coarsened ground glass and subpleural reticular densities are again noted, unchanged. No focal consolidation or pulmonary edema. No pleural effusion or pneumothorax. UPPER ABDOMEN: Small hiatal hernia. SOFT TISSUES AND BONES: No acute bone or soft tissue abnormality. IMPRESSION: 1. No evidence  of pulmonary embolism. 2. Peripheral and basilar predominant coarsened ground glass and subpleural reticular densities, unchanged. . Findings compatible with chronic interstitial lung disease. 3. Airway thickening in the lower lobes is compatible with bronchitis. Electronically signed by: Franky Crease MD 10/31/2024 10:34 PM EST RP Workstation: [id number]   DG Chest 2 View Result Date: 10/31/2024 EXAM: 2 VIEW(S) XRAY OF THE CHEST 10/31/2024 11:23:22 AM COMPARISON: 09/06/2024, 09/21/2024 CLINICAL  HISTORY: cough FINDINGS: LUNGS AND PLEURA: Coarsening of the pulmonary interstitium, with a peripheral and bibasilar predominance, as better seen on the comparison chest CT. No pleural effusion. No pneumothorax. HEART AND MEDIASTINUM: No acute abnormality of the cardiac and mediastinal silhouettes. BONES AND SOFT TISSUES: Degenerative disc disease of the visualized spine. No acute osseous abnormality. IMPRESSION: 1. Similar changes of underlying interstitial lung disease, better visualized on the comparison chest CT. No pneumonia or pulmonary edema. Electronically signed by: Rogelia Myers MD 10/31/2024 11:46 AM EST RP Workstation: [id number]   CT ABDOMEN PELVIS W CONTRAST Result Date: 10/31/2024 EXAM: CT ABDOMEN AND PELVIS WITH CONTRAST 10/31/2024 11:07:21 AM TECHNIQUE: CT of the abdomen and pelvis was performed with the administration of 100 mL of iohexol  (OMNIPAQUE ) 300 MG/ML solution. Multiplanar reformatted images are provided for review. Automated exposure control, iterative reconstruction, and/or weight-based adjustment of the mA/kV was utilized to reduce the radiation dose to as low as reasonably achievable. COMPARISON: 09/27/2023 CLINICAL HISTORY: RLQ abdominal pain, known previous appendectomy, R CVA tenderness. FINDINGS: LOWER CHEST: Posterior bibasilar dependent atelectasis. LIVER: The liver is unremarkable. GALLBLADDER AND BILE DUCTS: Gallbladder is unremarkable. No biliary ductal dilatation. SPLEEN: No acute abnormality. PANCREAS: No acute abnormality. ADRENAL GLANDS: No acute abnormality. KIDNEYS, URETERS AND BLADDER: No stones in the kidneys or ureters. No hydronephrosis. No perinephric or periureteral stranding. Urinary bladder is unremarkable. GI AND BOWEL: Small hiatal hernia. Decompressed stomach. The appendix is not visualized, reportedly surgically absent. There is no bowel obstruction. PERITONEUM AND RETROPERITONEUM: No ascites. No free air. VASCULATURE: Aorta is normal in caliber. LYMPH  NODES: No lymphadenopathy. REPRODUCTIVE ORGANS: Age-related atrophy of the uterus and ovaries. BONES AND SOFT TISSUES: No acute osseous abnormality. No focal soft tissue abnormality. Multilevel degenerative changes of the thoracolumbar spine. IMPRESSION: 1. No acute intra-abdominal or pelvic abnormality. . Electronically signed by: Rogelia Myers MD 10/31/2024 11:37 AM EST RP Workstation: [id number]   Scheduled Meds:  acetaminophen   1,000 mg Oral Q8H   budesonide   0.25 mg Nebulization BID   enoxaparin  (LOVENOX ) injection  40 mg Subcutaneous Q24H   famotidine   20 mg Oral Daily   lidocaine   1 patch Transdermal Q24H   pantoprazole   40 mg Oral q AM   Continuous Infusions:   LOS: 0 days    Time spent: 50 Mins    Darcel Dawley, MD [facility]   If 7PM-7AM, please contact night-coverage

## 2024-11-02 ENCOUNTER — Observation Stay (HOSPITAL_COMMUNITY): Payer: Self-pay

## 2024-11-02 LAB — GLUCOSE, CAPILLARY: Glucose-Capillary: 98 mg/dL (ref 70–99)

## 2024-11-02 MED ORDER — IPRATROPIUM-ALBUTEROL 0.5-2.5 (3) MG/3ML IN SOLN
3.0000 mL | Freq: Four times a day (QID) | RESPIRATORY_TRACT | Status: DC
  Administered 2024-11-02 – 2024-11-03 (×3): 3 mL via RESPIRATORY_TRACT
  Filled 2024-11-02 (×3): qty 3

## 2024-11-02 MED ORDER — METHOCARBAMOL 500 MG PO TABS
500.0000 mg | ORAL_TABLET | Freq: Three times a day (TID) | ORAL | Status: DC
  Administered 2024-11-02: 500 mg via ORAL
  Filled 2024-11-02 (×3): qty 1

## 2024-11-02 MED ORDER — KETOROLAC TROMETHAMINE 15 MG/ML IJ SOLN
7.5000 mg | Freq: Four times a day (QID) | INTRAMUSCULAR | Status: DC | PRN
  Administered 2024-11-03: 7.5 mg via INTRAVENOUS
  Filled 2024-11-02: qty 1

## 2024-11-02 MED ORDER — KETOROLAC TROMETHAMINE 15 MG/ML IJ SOLN: 15.0000 mg | Freq: Three times a day (TID) | INTRAMUSCULAR | Status: DC

## 2024-11-02 MED ORDER — KETOROLAC TROMETHAMINE 15 MG/ML IJ SOLN: 15.0000 mg | Freq: Three times a day (TID) | INTRAMUSCULAR | Status: DC | PRN

## 2024-11-02 MED ORDER — KETOROLAC TROMETHAMINE 15 MG/ML IJ SOLN
15.0000 mg | Freq: Four times a day (QID) | INTRAMUSCULAR | Status: DC
  Administered 2024-11-02: 15 mg via INTRAVENOUS
  Filled 2024-11-02 (×2): qty 1

## 2024-11-02 MED ORDER — BENZONATATE 100 MG PO CAPS
100.0000 mg | ORAL_CAPSULE | Freq: Three times a day (TID) | ORAL | Status: DC
  Administered 2024-11-02 – 2024-11-04 (×7): 100 mg via ORAL
  Filled 2024-11-02 (×8): qty 1

## 2024-11-02 MED ORDER — METHOCARBAMOL 500 MG PO TABS
500.0000 mg | ORAL_TABLET | Freq: Three times a day (TID) | ORAL | Status: DC | PRN
  Administered 2024-11-03: 500 mg via ORAL
  Filled 2024-11-02: qty 1

## 2024-11-02 NOTE — Progress Notes (Signed)
 PROGRESS NOTE    Amy Flowers  FMW:[id number] DOB: [birth year] DOA: 10/31/2024 PCP: Delbert Clam, MD   Brief Narrative:  This 58 yrs old [place] female with PMH significant for aortic atherosclerosis, esophageal dysmotility/GERD, hiatal hernia, vitamin D  deficiency presented in the ED for further evaluation of severe chronic cough leading to right-sided pleuritic chest and rib pain.  Daughter reports patient has severe persistent cough and shortness of breath for last 3 months.  She describes cough is productive of whitish sputum but no hemoptysis reported.There is no improvement in the cough despite taking all the prescribed medication by her pulmonologist including medications for GERD, Pulmicort  and albuterol  nebulization. Over the past 2 days patient is having severe right-sided lower rib pain.  She denies any fall or trauma.  She was not hypoxic initially but after she was given IV opioids for pain she desaturated to 89% on room air and required 2 L of supplemental oxygen.  Lipase is slightly elevated 76,  D-dimer 3.89.  UA negative for infection.  CT abdomen and pelvis no acute intraabdominal abnormality found.  Chest x-ray shows finding consistent with interstitial lung disease.  CTA chest ruled out pulmonary embolism.  Patient was admitted for further evaluation.  Assessment & Plan:   Principal Problem:   Rib pain on right side Active Problems:   GERD (gastroesophageal reflux disease)   Cough   UIP (usual interstitial pneumonitis) (HCC)   Severe persistent chronic cough leading to right-sided rib pain: History of interstitial lung disease / usual interstitial pneumonitis: Patient presented with persistent chronic cough leading to right-sided chest pain. No respiratory distress.  She was not hypoxic on arrival to the ED but later desatted to 89% on room air in the ED after receiving IV opiates for pain and was placed on 2 L [place] initially but now satting well on room air.  Her pain  appears to be musculoskeletal in nature and easily reproducible on exam on palpation of right posterior lateral lower ribs.  No falls or trauma reported.  Chest x-ray negative for acute findings.  However, D-dimer elevated, so CT angiogram chest was completed which ruled out PE. Continue Pulmicort  neb twice daily, albuterol  neb every 6 hours PRN. RUQ U/S normal -schedule robaxin , toradol , tessalon  perles -PRN tussinex -check dg right ribs -IS -home O2 eval  Esophageal dysmotility/GERD: Continue pantoprazole  and famotidine .  Elevated lipase: Lipase slightly elevated at 76.   CT does not show any pancreatitis.    DVT prophylaxis: Lovenox  Code Status: Full code Family Communication: Daughter at bedside to help with translation.    Status is: Observation The patient remains OBS appropriate and will d/c before 2 midnights.    Subjective: Still reports severe chest pain on right lower side  Objective: Vitals:   11/01/24 2046 11/02/24 0358 11/02/24 0756 11/02/24 0823  BP:  132/77 123/69   Pulse:  70 85 70  Resp:  17 16 18   Temp:  99.2 F (37.3 C) 98.6 F (37 C)   TempSrc:      SpO2: 93% 93% (!) 86% 92%  Weight:      Height:        Intake/Output Summary (Last 24 hours) at 11/02/2024 1132 Last data filed at 11/02/2024 0900 Gross per 24 hour  Intake 0 ml  Output --  Net 0 ml   Filed Weights   11/01/24 0328  Weight: 67 kg    Examination:   General: Appearance:     Overweight female in no acute distress  Lungs:     respirations unlabored  Heart:    Normal heart rate.    MS:   All extremities are intact.   Neurologic:   Awake, alert      Data Reviewed: I have personally reviewed following labs and imaging studies  CBC: Recent Labs  Lab 10/31/24 1010  WBC 5.6  NEUTROABS 2.6  HGB 14.8  HCT 44.6  MCV 92.5  PLT 151   Basic Metabolic Panel: Recent Labs  Lab 10/31/24 1010  NA 141  K 4.4  CL 102  CO2 29  GLUCOSE 100*  BUN 8  CREATININE 0.71   CALCIUM 10.0   GFR: Estimated Creatinine Clearance: 65.5 mL/min (by C-G formula based on SCr of 0.71 mg/dL). Liver Function Tests: Recent Labs  Lab 10/31/24 1010  AST 37  ALT 25  ALKPHOS 81  BILITOT 0.6  PROT 7.9  ALBUMIN 4.3   Recent Labs  Lab 10/31/24 1010  LIPASE 76*   No results for input(s): AMMONIA in the last 168 hours. Coagulation Profile: No results for input(s): INR, PROTIME in the last 168 hours. Cardiac Enzymes: No results for input(s): CKTOTAL, CKMB, CKMBINDEX, TROPONINI in the last 168 hours. BNP (last 3 results) No results for input(s): PROBNP in the last 8760 hours. HbA1C: No results for input(s): HGBA1C in the last 72 hours. CBG: No results for input(s): GLUCAP in the last 168 hours. Lipid Profile: No results for input(s): CHOL, HDL, LDLCALC, TRIG, CHOLHDL, LDLDIRECT in the last 72 hours. Thyroid Function Tests: No results for input(s): TSH, T4TOTAL, FREET4, T3FREE, THYROIDAB in the last 72 hours. Anemia Panel: No results for input(s): VITAMINB12, FOLATE, FERRITIN, TIBC, IRON, RETICCTPCT in the last 72 hours. Sepsis Labs: No results for input(s): PROCALCITON, LATICACIDVEN in the last 168 hours.  No results found for this or any previous visit (from the past 240 hours).   Radiology Studies: US  Abdomen Limited RUQ (LIVER/GB) Result Date: 11/01/2024 CLINICAL DATA:  RIGHT upper quadrant pain EXAM: ULTRASOUND ABDOMEN LIMITED RIGHT UPPER QUADRANT COMPARISON:  CT abdomen pelvis 11/01/2019 FINDINGS: Gallbladder: Gallstones: None Sludge: None Gallbladder Wall: Within normal limits Pericholecystic fluid: None Sonographic Murphy's Sign: Negative per technologist Common bile duct: Diameter: 4 mm Liver: Parenchymal echogenicity: Within normal limits Contours: Normal Lesions: None Portal vein: Patent.  Hepatopetal flow Other: None. IMPRESSION: No significant sonographic abnormality of the liver or gallbladder.  Electronically Signed   By: Aliene Lloyd M.D.   On: 11/01/2024 16:56   CT Angio Chest Pulmonary Embolism (PE) W or WO Contrast Result Date: 10/31/2024 EXAM: CTA CHEST AORTA 10/31/2024 10:20:44 PM TECHNIQUE: CTA of the chest was performed without and with the administration of 75 mL of iohexol  (OMNIPAQUE ) 350 MG/ML injection. Multiplanar reformatted images are provided for review. MIP images are provided for review. Automated exposure control, iterative reconstruction, and/or weight based adjustment of the mA/kV was utilized to reduce the radiation dose to as low as reasonably achievable. COMPARISON: 09/21/2024. CLINICAL HISTORY: Pulmonary embolism (PE) suspected, high prob. FINDINGS: AORTA: No thoracic aortic dissection. No aneurysm. MEDIASTINUM: No mediastinal lymphadenopathy. The heart and pericardium demonstrate no acute abnormality. LYMPH NODES: No mediastinal, hilar or axillary lymphadenopathy. LUNGS AND PLEURA: Airway thickening in the lower lobes bilaterally. Peripheral and basilar predominant coarsened ground glass and subpleural reticular densities are again noted, unchanged. No focal consolidation or pulmonary edema. No pleural effusion or pneumothorax. UPPER ABDOMEN: Small hiatal hernia. SOFT TISSUES AND BONES: No acute bone or soft tissue abnormality. IMPRESSION: 1. No evidence of pulmonary embolism. 2.  Peripheral and basilar predominant coarsened ground glass and subpleural reticular densities, unchanged. . Findings compatible with chronic interstitial lung disease. 3. Airway thickening in the lower lobes is compatible with bronchitis. Electronically signed by: Franky Crease MD 10/31/2024 10:34 PM EST RP Workstation: [id number]   Scheduled Meds:  acetaminophen   1,000 mg Oral Q8H   benzonatate   100 mg Oral TID   budesonide   0.25 mg Nebulization BID   enoxaparin  (LOVENOX ) injection  40 mg Subcutaneous Q24H   famotidine   20 mg Oral Daily   ketorolac   15 mg Intravenous Q6H   lidocaine   1 patch  Transdermal Q24H   methocarbamol   500 mg Oral TID   pantoprazole   40 mg Oral q AM   Continuous Infusions:   LOS: 0 days    Time spent: 50 Mins    Harlene RAYMOND Bowl, DO [facility]   If 7PM-7AM, please contact night-coverage

## 2024-11-02 NOTE — Plan of Care (Signed)

## 2024-11-03 MED ORDER — PREDNISONE 20 MG PO TABS
40.0000 mg | ORAL_TABLET | Freq: Every day | ORAL | Status: DC
  Administered 2024-11-03 – 2024-11-04 (×2): 40 mg via ORAL
  Filled 2024-11-03 (×2): qty 2

## 2024-11-03 MED ORDER — OXYCODONE HCL 5 MG PO TABS
2.5000 mg | ORAL_TABLET | Freq: Once | ORAL | Status: AC
  Administered 2024-11-03: 2.5 mg via ORAL
  Filled 2024-11-03: qty 1

## 2024-11-03 MED ORDER — IPRATROPIUM-ALBUTEROL 0.5-2.5 (3) MG/3ML IN SOLN
3.0000 mL | Freq: Two times a day (BID) | RESPIRATORY_TRACT | Status: DC
  Administered 2024-11-03: 3 mL via RESPIRATORY_TRACT
  Filled 2024-11-03: qty 3

## 2024-11-03 NOTE — Progress Notes (Signed)
 PROGRESS NOTE    Amy Flowers  FMW:[id number] DOB: 1966/07/13 DOA: 10/31/2024 PCP: Delbert Clam, MD   Brief Narrative:  This 58 yrs old [place]ese female with PMH significant for aortic atherosclerosis, esophageal dysmotility/GERD, hiatal hernia, vitamin D  deficiency presented in the ED for further evaluation of severe chronic cough leading to right-sided pleuritic chest and rib pain.  Daughter reports patient has severe persistent cough and shortness of breath for last 3 months.  She describes cough is productive of whitish sputum but no hemoptysis reported.There is no improvement in the cough despite taking all the prescribed medication by her pulmonologist including medications for GERD, Pulmicort  and albuterol  nebulization. Over the past 2 days patient is having severe right-sided lower rib pain.  She denies any fall or trauma.  She was not hypoxic initially but after she was given IV opioids for pain she desaturated to 89% on room air and required 2 L of supplemental oxygen.  Lipase is slightly elevated 76,  D-dimer 3.89.  UA negative for infection.  CT abdomen and pelvis no acute intraabdominal abnormality found.  Chest x-ray shows finding consistent with interstitial lung disease.  CTA chest ruled out pulmonary embolism.  Patient was admitted for further evaluation.  Scheduled pain medications made the patient too sedate so I have again changed her meds to get her more controlled.  Assessment & Plan:   Principal Problem:   Rib pain on right side Active Problems:   GERD (gastroesophageal reflux disease)   Cough   UIP (usual interstitial pneumonitis) (HCC)   Severe persistent chronic cough leading to right-sided rib pain: History of interstitial lung disease / usual interstitial pneumonitis: Patient presented with persistent chronic cough leading to right-sided chest pain. No respiratory distress.  She was not hypoxic on arrival to the ED but later desatted to 89% on room air in the ED  after receiving IV opiates for pain and was placed on 2 L [place] initially but now satting well on room air.  Her pain appears to be musculoskeletal in nature and easily reproducible on exam on palpation of right posterior lateral lower ribs.  No falls or trauma reported.  Chest x-ray negative for acute findings.  However, D-dimer elevated, so CT angiogram chest was completed which ruled out PE. Continue Pulmicort  neb twice daily, albuterol  neb every 6 hours PRN. RUQ U/S normal -schedule robaxin , toradol  made patient too sedate--low dose oxy, added steroids as that has helped in the past -PRN tussinex - dg right ribs unrevealing -IS (did not get yesterday) -home O2 eval  Esophageal dysmotility/GERD: Continue pantoprazole  and famotidine .  Elevated lipase: Lipase slightly elevated at 76.   CT does not show any pancreatitis.   TOC consult for help with medications- says pharmacy open tomorrow so will plan for d/c in AM   DVT prophylaxis: Lovenox  Code Status: Full code Family Communication: Daughter at bedside to help with translation.      Subjective: Pain Improved with oxy but still with cough  Objective: Vitals:   11/03/24 0254 11/03/24 0425 11/03/24 0735 11/03/24 0900  BP:  124/65 128/73   Pulse:  79 70   Resp:  17    Temp:  97.9 F (36.6 C) 97.8 F (36.6 C)   TempSrc:   Oral   SpO2: 94% 95% 93% 99%  Weight:      Height:        Intake/Output Summary (Last 24 hours) at 11/03/2024 1139 Last data filed at 11/02/2024 1700 Gross per 24 hour  Intake  120 ml  Output --  Net 120 ml   Filed Weights   11/01/24 0328  Weight: 67 kg    Examination:   General: Appearance:     Overweight female in no acute distress     Lungs:     respirations unlabored, pain reproducible with palpation  Heart:    Normal heart rate.    MS:   All extremities are intact.   Neurologic:   Awake, alert      Data Reviewed: I have personally reviewed following labs and imaging  studies  CBC: Recent Labs  Lab 10/31/24 1010  WBC 5.6  NEUTROABS 2.6  HGB 14.8  HCT 44.6  MCV 92.5  PLT 151   Basic Metabolic Panel: Recent Labs  Lab 10/31/24 1010  NA 141  K 4.4  CL 102  CO2 29  GLUCOSE 100*  BUN 8  CREATININE 0.71  CALCIUM 10.0   GFR: Estimated Creatinine Clearance: 65.5 mL/min (by C-G formula based on SCr of 0.71 mg/dL). Liver Function Tests: Recent Labs  Lab 10/31/24 1010  AST 37  ALT 25  ALKPHOS 81  BILITOT 0.6  PROT 7.9  ALBUMIN 4.3   Recent Labs  Lab 10/31/24 1010  LIPASE 76*   No results for input(s): AMMONIA in the last 168 hours. Coagulation Profile: No results for input(s): INR, PROTIME in the last 168 hours. Cardiac Enzymes: No results for input(s): CKTOTAL, CKMB, CKMBINDEX, TROPONINI in the last 168 hours. BNP (last 3 results) No results for input(s): PROBNP in the last 8760 hours. HbA1C: No results for input(s): HGBA1C in the last 72 hours. CBG: Recent Labs  Lab 11/02/24 1705  GLUCAP 98   Lipid Profile: No results for input(s): CHOL, HDL, LDLCALC, TRIG, CHOLHDL, LDLDIRECT in the last 72 hours. Thyroid Function Tests: No results for input(s): TSH, T4TOTAL, FREET4, T3FREE, THYROIDAB in the last 72 hours. Anemia Panel: No results for input(s): VITAMINB12, FOLATE, FERRITIN, TIBC, IRON, RETICCTPCT in the last 72 hours. Sepsis Labs: No results for input(s): PROCALCITON, LATICACIDVEN in the last 168 hours.  No results found for this or any previous visit (from the past 240 hours).   Radiology Studies: DG Ribs Unilateral W/Chest Right Result Date: 11/02/2024 CLINICAL DATA:  Right-sided chest pain. EXAM: RIGHT RIBS AND CHEST - 3+ VIEW COMPARISON:  Radiograph dated 10/31/2024. FINDINGS: No focal consolidation, pleural effusion or pneumothorax. Cardiac silhouette is within normal limits. No acute osseous pathology. No displaced rib fractures. IMPRESSION: 1. No acute  cardiopulmonary process. 2. No displaced rib fractures. Electronically Signed   By: Vanetta Chou M.D.   On: 11/02/2024 20:15   US  Abdomen Limited RUQ (LIVER/GB) Result Date: 11/01/2024 CLINICAL DATA:  RIGHT upper quadrant pain EXAM: ULTRASOUND ABDOMEN LIMITED RIGHT UPPER QUADRANT COMPARISON:  CT abdomen pelvis 11/01/2019 FINDINGS: Gallbladder: Gallstones: None Sludge: None Gallbladder Wall: Within normal limits Pericholecystic fluid: None Sonographic Murphy's Sign: Negative per technologist Common bile duct: Diameter: 4 mm Liver: Parenchymal echogenicity: Within normal limits Contours: Normal Lesions: None Portal vein: Patent.  Hepatopetal flow Other: None. IMPRESSION: No significant sonographic abnormality of the liver or gallbladder. Electronically Signed   By: Aliene Lloyd M.D.   On: 11/01/2024 16:56   Scheduled Meds:  acetaminophen   1,000 mg Oral Q8H   benzonatate   100 mg Oral TID   budesonide   0.25 mg Nebulization BID   enoxaparin  (LOVENOX ) injection  40 mg Subcutaneous Q24H   famotidine   20 mg Oral Daily   ipratropium-albuterol   3 mL Nebulization Q6H  lidocaine   1 patch Transdermal Q24H   pantoprazole   40 mg Oral q AM   predniSONE   40 mg Oral Q breakfast   Continuous Infusions:   LOS: 0 days    Time spent: 50 Mins    Harlene RAYMOND Bowl, DO [facility]   If 7PM-7AM, please contact night-coverage

## 2024-11-03 NOTE — Plan of Care (Signed)
  Problem: Education: Goal: Knowledge of General Education information will improve Description: Including pain rating scale, medication(s)/side effects and non-pharmacologic comfort measures Outcome: Progressing   Problem: Health Behavior/Discharge Planning: Goal: Ability to manage health-related needs will improve Outcome: Progressing   Problem: Clinical Measurements: Goal: Ability to maintain clinical measurements within normal limits will improve Outcome: Progressing   Problem: Activity: Goal: Risk for activity intolerance will decrease Outcome: Progressing   Problem: Nutrition: Goal: Adequate nutrition will be maintained Outcome: Progressing   Problem: Elimination: Goal: Will not experience complications related to bowel motility Outcome: Progressing   Problem: Safety: Goal: Ability to remain free from injury will improve Outcome: Progressing   

## 2024-11-03 NOTE — TOC Initial Note (Signed)
 Transition of Care [facility]) - Initial/Assessment Note    Patient Details  Name: Amy Flowers MRN: [id number] Date of Birth: 02/17/1966  [facility] of [facility]) CM/SW Contact:    Rosalva Jon Bloch, RN Phone Number: 11/03/2024, 2:37 PM  Clinical Narrative:                 Presents with rib pain. From home with daughter. Pt speaks Arabic, no English. Daughter speaks English. PTA independent with ADL's, no DME usage.  Pt without job, limited income. Per provider pt will need home nebulizer @ d/c. Order noted. Pt or daughter can't afford nebulizer.  Approval received from [facility] leadership [employer]) for nebulizer. Referral made with [facility] [phone]) with [employer]. Dolonda unable to provide cost for nebulizer, states will f/u with CM tomorrow am. Pt will need Match Letter to assist with medication cost. Referral to [facility]. made. Pt without transportation issues, daughter provides transportation to provider appointments. PCP confirmed: Corrina Sabin  ICM team following.....  Expected Discharge Plan: Home/Self Care     Patient Goals and CMS Choice            Expected Discharge Plan and Services                         DME Arranged: Nebulizer machine (LOG) DME Agency: [employer] Date DME Agency Contacted: 11/03/24 Time DME Agency Contacted: [phone] Representative spoke with at DME Agency: [place]            Prior Living Arrangements/Services     Patient language and need for interpreter reviewed:: Yes Do you feel safe going back to the place where you live?: Yes      Need for Family Participation in Patient Care: Yes (Comment) Care giver support system in place?: Yes (comment)   Criminal Activity/Legal Involvement Pertinent to Current Situation/Hospitalization: No - Comment as needed  Activities of Daily Living   ADL Screening (condition at time of admission) Independently performs ADLs?: Yes (appropriate for developmental age) Is the patient deaf or have  difficulty hearing?: No Does the patient have difficulty seeing, even when wearing glasses/contacts?: No Does the patient have difficulty concentrating, remembering, or making decisions?: No  Permission Sought/Granted                  Emotional Assessment         Alcohol / Substance Use: Not Applicable Psych Involvement: No (comment)  Admission diagnosis:  Pulmonary fibrosis (HCC) [J84.10] Chronic cough [R05.3] Rib pain [R07.89] Right upper quadrant abdominal pain [R10.11] Rib pain on right side [R07.89] Patient Active Problem List   Diagnosis Date Noted   Rib pain on right side 10/31/2024   UIP (usual interstitial pneumonitis) (HCC) 10/31/2024   Cough 10/03/2024   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 [place] [address] Phone: [phone] Fax: [phone]     Social Drivers of Health (SDOH) Social History: SDOH Screenings   Food Insecurity: No Food Insecurity (10/31/2024)  Housing: Low Risk  (  10/31/2024)  Transportation Needs: No Transportation Needs (10/31/2024)  Utilities: Not At Risk (10/31/2024)  Alcohol Screen: Low Risk  (10/14/2023)  Depression (PHQ2-9): Low Risk  (08/11/2024)  Financial Resource Strain: Low Risk  (11/24/2023)  Physical Activity: Sufficiently Active (11/24/2023)  Recent Concern: Physical Activity - Insufficiently Active (10/14/2023)  Social Connections: Socially Isolated (11/24/2023)  Stress: No Stress Concern  Present (11/24/2023)  Tobacco Use: Low Risk  (10/31/2024)  Health Literacy: Adequate Health Literacy (10/14/2023)   SDOH Interventions:     Readmission Risk Interventions     No data to display

## 2024-11-04 ENCOUNTER — Other Ambulatory Visit: Payer: Self-pay

## 2024-11-04 ENCOUNTER — Other Ambulatory Visit (HOSPITAL_COMMUNITY): Payer: Self-pay

## 2024-11-04 MED ORDER — ALBUTEROL SULFATE (2.5 MG/3ML) 0.083% IN NEBU
2.5000 mg | INHALATION_SOLUTION | Freq: Four times a day (QID) | RESPIRATORY_TRACT | 0 refills | Status: DC | PRN
  Filled 2024-11-04: qty 180, 15d supply, fill #0

## 2024-11-04 MED ORDER — PREDNISONE 10 MG PO TABS
ORAL_TABLET | ORAL | 0 refills | Status: AC
  Filled 2024-11-04: qty 21, 10d supply, fill #0

## 2024-11-04 MED ORDER — ACETAMINOPHEN 500 MG PO TABS: 1000.0000 mg | ORAL_TABLET | Freq: Three times a day (TID) | ORAL | Status: AC

## 2024-11-04 MED ORDER — METHOCARBAMOL 500 MG PO TABS
250.0000 mg | ORAL_TABLET | Freq: Two times a day (BID) | ORAL | 0 refills | Status: AC
  Filled 2024-11-04: qty 20, 20d supply, fill #0

## 2024-11-04 MED ORDER — OXYCODONE HCL 5 MG PO TABS
2.5000 mg | ORAL_TABLET | Freq: Four times a day (QID) | ORAL | 0 refills | Status: DC | PRN
  Filled 2024-11-04: qty 12, 6d supply, fill #0

## 2024-11-04 MED ORDER — BUDESONIDE 0.25 MG/2ML IN SUSP
RESPIRATORY_TRACT | 0 refills | Status: DC
  Filled 2024-11-04: qty 60, 30d supply, fill #0

## 2024-11-04 MED ORDER — LIDOCAINE 5 % EX PTCH
1.0000 | MEDICATED_PATCH | CUTANEOUS | 0 refills | Status: DC
  Filled 2024-11-04: qty 30, 30d supply, fill #0

## 2024-11-04 MED ORDER — BENZONATATE 100 MG PO CAPS
100.0000 mg | ORAL_CAPSULE | Freq: Three times a day (TID) | ORAL | 0 refills | Status: DC
  Filled 2024-11-04: qty 30, 10d supply, fill #0

## 2024-11-04 MED ORDER — GUAIFENESIN ER 600 MG PO TB12
600.0000 mg | ORAL_TABLET | Freq: Two times a day (BID) | ORAL | 0 refills | Status: DC
  Filled 2024-11-04: qty 60, 30d supply, fill #0

## 2024-11-04 NOTE — Discharge Summary (Signed)
 Physician Discharge Summary  Amy Flowers FMW:[id number] DOB: 1966/08/01 DOA: 10/31/2024  PCP: Delbert Clam, MD  Admit date: 10/31/2024 Discharge date: 11/04/2024  Admitted From:  Discharge disposition: home   Recommendations for Outpatient Follow-Up:   Close outpatient follow up with pulm   Discharge Diagnosis:    Discharge Condition: Improved.  Diet recommendation:   Regular.  Wound care: None.  Code status: Full.   History of Present Illness:   58 yrs old Sudanese female with PMH significant for aortic atherosclerosis, esophageal dysmotility/GERD, hiatal hernia, vitamin D  deficiency presented in the ED for further evaluation of severe chronic cough leading to right-sided pleuritic chest and rib pain.  Daughter reports patient has severe persistent cough and shortness of breath for last 3 months.  She describes cough is productive of whitish sputum but no hemoptysis reported.There is no improvement in the cough despite taking all the prescribed medication by her pulmonologist including medications for GERD, Pulmicort  and albuterol  nebulization. Over the past 2 days patient is having severe right-sided lower rib pain.  She denies any fall or trauma.  She was not hypoxic initially but after she was given IV opioids for pain she desaturated to 89% on room air and required 2 L of supplemental oxygen.  Lipase is slightly elevated 76,  D-dimer 3.89.  UA negative for infection.  CT abdomen and pelvis no acute intraabdominal abnormality found.  Chest x-ray shows finding consistent with interstitial lung disease.  CTA chest ruled out pulmonary embolism.  Patient was admitted for further evaluation.  Scheduled pain medications made the patient too sedate so I have again changed her meds to get her more controlled.    Hospital Course by Problem:   Severe persistent chronic cough leading to right-sided rib pain: History of interstitial lung disease / usual interstitial  pneumonitis: Patient presented with persistent chronic cough leading to right-sided chest pain. No respiratory distress.  She was not hypoxic on arrival to the ED but later desatted to 89% on room air in the ED after receiving IV opiates for pain and was placed on 2 L [place] initially but now satting well on room air.  Her pain appears to be musculoskeletal in nature and easily reproducible on exam on palpation of right posterior lateral lower ribs.  No falls or trauma reported.  Chest x-ray negative for acute findings.  However, D-dimer elevated, so CT angiogram chest was completed which ruled out PE. Continue Pulmicort  neb twice daily, albuterol  neb every 6 hours PRN. RUQ U/S normal -schedule robaxin , toradol  made patient too sedate--low dose oxy, added steroids as that has helped in the past -PRN tussinex - dg right ribs unrevealing -IS (did not get yesterday) -no need for home O2 -close pulm follow up outpateint   Esophageal dysmotility/GERD: Continue pantoprazole  and famotidine .   Elevated lipase: Lipase slightly elevated at 76.   CT does not show any pancreatitis.    Medical Consultants:      Discharge Exam:   Vitals:   11/04/24 0404 11/04/24 0805  BP: 122/75 123/68  Pulse: 82 72  Resp: 18 17  Temp: 98 F (36.7 C) 97.9 F (36.6 C)  SpO2: 100% 100%   Vitals:   11/03/24 2047 11/03/24 2058 11/04/24 0404 11/04/24 0805  BP:  130/79 122/75 123/68  Pulse:  87 82 72  Resp:   18 17  Temp:   98 F (36.7 C) 97.9 F (36.6 C)  TempSrc:    Oral  SpO2: 97%  100%  100%  Weight:      Height:        General exam: Appears calm and comfortable. Much improved   The results of significant diagnostics from this hospitalization (including imaging, microbiology, ancillary and laboratory) are listed below for reference.     Procedures and Diagnostic Studies:   US  Abdomen Limited RUQ (LIVER/GB) Result Date: 11/01/2024 CLINICAL DATA:  RIGHT upper quadrant pain EXAM: ULTRASOUND  ABDOMEN LIMITED RIGHT UPPER QUADRANT COMPARISON:  CT abdomen pelvis 11/01/2019 FINDINGS: Gallbladder: Gallstones: None Sludge: None Gallbladder Wall: Within normal limits Pericholecystic fluid: None Sonographic Murphy's Sign: Negative per technologist Common bile duct: Diameter: 4 mm Liver: Parenchymal echogenicity: Within normal limits Contours: Normal Lesions: None Portal vein: Patent.  Hepatopetal flow Other: None. IMPRESSION: No significant sonographic abnormality of the liver or gallbladder. Electronically Signed   By: Aliene Lloyd M.D.   On: 11/01/2024 16:56   CT Angio Chest Pulmonary Embolism (PE) W or WO Contrast Result Date: 10/31/2024 EXAM: CTA CHEST AORTA 10/31/2024 10:20:44 PM TECHNIQUE: CTA of the chest was performed without and with the administration of 75 mL of iohexol  (OMNIPAQUE ) 350 MG/ML injection. Multiplanar reformatted images are provided for review. MIP images are provided for review. Automated exposure control, iterative reconstruction, and/or weight based adjustment of the mA/kV was utilized to reduce the radiation dose to as low as reasonably achievable. COMPARISON: 09/21/2024. CLINICAL HISTORY: Pulmonary embolism (PE) suspected, high prob. FINDINGS: AORTA: No thoracic aortic dissection. No aneurysm. MEDIASTINUM: No mediastinal lymphadenopathy. The heart and pericardium demonstrate no acute abnormality. LYMPH NODES: No mediastinal, hilar or axillary lymphadenopathy. LUNGS AND PLEURA: Airway thickening in the lower lobes bilaterally. Peripheral and basilar predominant coarsened ground glass and subpleural reticular densities are again noted, unchanged. No focal consolidation or pulmonary edema. No pleural effusion or pneumothorax. UPPER ABDOMEN: Small hiatal hernia. SOFT TISSUES AND BONES: No acute bone or soft tissue abnormality. IMPRESSION: 1. No evidence of pulmonary embolism. 2. Peripheral and basilar predominant coarsened ground glass and subpleural reticular densities, unchanged. .  Findings compatible with chronic interstitial lung disease. 3. Airway thickening in the lower lobes is compatible with bronchitis. Electronically signed by: Franky Crease MD 10/31/2024 10:34 PM EST RP Workstation: [id number]   DG Chest 2 View Result Date: 10/31/2024 EXAM: 2 VIEW(S) XRAY OF THE CHEST 10/31/2024 11:23:22 AM COMPARISON: 09/06/2024, 09/21/2024 CLINICAL HISTORY: cough FINDINGS: LUNGS AND PLEURA: Coarsening of the pulmonary interstitium, with a peripheral and bibasilar predominance, as better seen on the comparison chest CT. No pleural effusion. No pneumothorax. HEART AND MEDIASTINUM: No acute abnormality of the cardiac and mediastinal silhouettes. BONES AND SOFT TISSUES: Degenerative disc disease of the visualized spine. No acute osseous abnormality. IMPRESSION: 1. Similar changes of underlying interstitial lung disease, better visualized on the comparison chest CT. No pneumonia or pulmonary edema. Electronically signed by: Rogelia Myers MD 10/31/2024 11:46 AM EST RP Workstation: [id number]   CT ABDOMEN PELVIS W CONTRAST Result Date: 10/31/2024 EXAM: CT ABDOMEN AND PELVIS WITH CONTRAST 10/31/2024 11:07:21 AM TECHNIQUE: CT of the abdomen and pelvis was performed with the administration of 100 mL of iohexol  (OMNIPAQUE ) 300 MG/ML solution. Multiplanar reformatted images are provided for review. Automated exposure control, iterative reconstruction, and/or weight-based adjustment of the mA/kV was utilized to reduce the radiation dose to as low as reasonably achievable. COMPARISON: 09/27/2023 CLINICAL HISTORY: RLQ abdominal pain, known previous appendectomy, R CVA tenderness. FINDINGS: LOWER CHEST: Posterior bibasilar dependent atelectasis. LIVER: The liver is unremarkable. GALLBLADDER AND BILE DUCTS: Gallbladder is unremarkable. No biliary ductal dilatation. SPLEEN: No  acute abnormality. PANCREAS: No acute abnormality. ADRENAL GLANDS: No acute abnormality. KIDNEYS, URETERS AND BLADDER: No stones in the  kidneys or ureters. No hydronephrosis. No perinephric or periureteral stranding. Urinary bladder is unremarkable. GI AND BOWEL: Small hiatal hernia. Decompressed stomach. The appendix is not visualized, reportedly surgically absent. There is no bowel obstruction. PERITONEUM AND RETROPERITONEUM: No ascites. No free air. VASCULATURE: Aorta is normal in caliber. LYMPH NODES: No lymphadenopathy. REPRODUCTIVE ORGANS: Age-related atrophy of the uterus and ovaries. BONES AND SOFT TISSUES: No acute osseous abnormality. No focal soft tissue abnormality. Multilevel degenerative changes of the thoracolumbar spine. IMPRESSION: 1. No acute intra-abdominal or pelvic abnormality. . Electronically signed by: Rogelia Myers MD 10/31/2024 11:37 AM EST RP Workstation: [id number]     Labs:   Basic Metabolic Panel: Recent Labs  Lab 10/31/24 1010  NA 141  K 4.4  CL 102  CO2 29  GLUCOSE 100*  BUN 8  CREATININE 0.71  CALCIUM 10.0   GFR Estimated Creatinine Clearance: 65.5 mL/min (by C-G formula based on SCr of 0.71 mg/dL). Liver Function Tests: Recent Labs  Lab 10/31/24 1010  AST 37  ALT 25  ALKPHOS 81  BILITOT 0.6  PROT 7.9  ALBUMIN 4.3   Recent Labs  Lab 10/31/24 1010  LIPASE 76*   No results for input(s): AMMONIA in the last 168 hours. Coagulation profile No results for input(s): INR, PROTIME in the last 168 hours.  CBC: Recent Labs  Lab 10/31/24 1010  WBC 5.6  NEUTROABS 2.6  HGB 14.8  HCT 44.6  MCV 92.5  PLT 151   Cardiac Enzymes: No results for input(s): CKTOTAL, CKMB, CKMBINDEX, TROPONINI in the last 168 hours. BNP: Invalid input(s): POCBNP CBG: Recent Labs  Lab 11/02/24 1705  GLUCAP 98   D-Dimer No results for input(s): DDIMER in the last 72 hours. Hgb A1c No results for input(s): HGBA1C in the last 72 hours. Lipid Profile No results for input(s): CHOL, HDL, LDLCALC, TRIG, CHOLHDL, LDLDIRECT in the last 72 hours. Thyroid function  studies No results for input(s): TSH, T4TOTAL, T3FREE, THYROIDAB in the last 72 hours.  Invalid input(s): FREET3 Anemia work up No results for input(s): VITAMINB12, FOLATE, FERRITIN, TIBC, IRON, RETICCTPCT in the last 72 hours. Microbiology No results found for this or any previous visit (from the past 240 hours).   Discharge Instructions:   Discharge Instructions     Diet - low sodium heart healthy   Complete by: As directed    Discharge instructions   Complete by: As directed    While taking the pain meds, use miralax or another agent as the pain medication can be constipating   From last visit with Dr. Wert 10/25 Pantoprazole  (protonix ) 40 mg   Take  30-60 min before first meal of the day and Pepcid  (famotidine )  20 mg after supper until return to office - this is the best way to tell whether stomach acid is contributing to your problem.     GERD (REFLUX)  is an extremely common cause of respiratory symptoms just like yours , many times with no obvious heartburn at all.     It can be treated with medication, but also with lifestyle changes including elevation of the head of your bed (ideally with 6 -8inch blocks under the headboard of your bed),  Smoking cessation, avoidance of late meals, excessive alcohol, and avoid fatty foods, chocolate, peppermint, colas, red wine, and acidic juices such as orange juice.  NO MINT OR MENTHOL PRODUCTS SO NO COUGH  DROPS - LUDEN's pectin cough drops are ok  USE SUGARLESS CANDY INSTEAD (Jolley ranchers or Stover's or Life Savers) or even ice chips will also do - the key is to swallow to prevent all throat clearing. NO OIL BASED VITAMINS - use powdered substitutes.  Avoid fish oil when coughing.   Increase activity slowly   Complete by: As directed       Allergies as of 11/04/2024       Reactions   Cyclobenzaprine  Hives, Itching   Symbicort  [budesonide -formoterol  Fumarate]    Per daughter caused her lips to swell         Medication List     STOP taking these medications    fluticasone -salmeterol 500-50 MCG/ACT Aepb Commonly known as: ADVAIR    tiZANidine  4 MG tablet Commonly known as: Zanaflex        TAKE these medications    acetaminophen  500 MG tablet Commonly known as: TYLENOL  Take 2 tablets (1,000 mg total) by mouth every 8 (eight) hours for 17 days.   albuterol  (2.5 MG/3ML) 0.083% nebulizer solution Commonly known as: PROVENTIL  Take 3 mLs (2.5 mg total) by nebulization every 6 (six) hours as needed for wheezing or shortness of breath.   benzonatate  100 MG capsule Commonly known as: TESSALON  Take 1 capsule (100 mg total) by mouth every 8 (eight) hours.   budesonide  0.25 MG/2ML nebulizer solution Commonly known as: Pulmicort  Combine with albuterol  in nebulizer twice daily   famotidine  20 MG tablet Commonly known as: Pepcid  Take 1 tablet (20 mg total) by mouth daily.   guaiFENesin  600 MG 12 hr tablet Commonly known as: Mucinex  Take 1 tablet (600 mg total) by mouth 2 (two) times daily.   lidocaine  5 % Commonly known as: Lidoderm  Place 1 patch onto the skin daily. Remove & Discard patch within 12 hours or as directed by MD   methocarbamol  500 MG tablet Commonly known as: ROBAXIN  Take 0.5 tablets (250 mg total) by mouth 2 (two) times daily.   oxyCODONE  5 MG immediate release tablet Commonly known as: Oxy IR/ROXICODONE  Take 0.5 tablets (2.5 mg total) by mouth every 6 (six) hours as needed for severe pain (pain score 7-10).   pantoprazole  40 MG tablet Commonly known as: Protonix  Take 1 tablet (40 mg total) by mouth daily. Take 30-60 min before first meal of the day   predniSONE  10 MG tablet Commonly known as: DELTASONE  40 mg x 2 days then 30mg  x 2 days then 20mg  x2 days then 10mg  x 2 days then 5mg  x 2 days then stop   promethazine -dextromethorphan 6.25-15 MG/5ML syrup Commonly known as: PROMETHAZINE -DM Take 5 mLs by mouth at bedtime as needed for cough.                Durable Medical Equipment  (From admission, onward)           Start     Ordered   11/03/24 1136  For home use only DME Nebulizer machine  Once       Question Answer Comment  Patient needs a nebulizer to treat with the following condition ILD (interstitial lung disease) (HCC)   Length of Need Lifetime   Additional equipment included Administration kit      11/03/24 1136            Follow-up Information     Delbert Clam, MD Follow up on 11/08/2024.   Specialty: Family Medicine Contact information: [address] [place] [address] [phone]  Time coordinating discharge: 45 min  Signed:  Harlene RAYMOND Bowl DO  [facility] 11/04/2024, 8:47 AM

## 2024-11-04 NOTE — TOC Progression Note (Addendum)
 Transition of Care (TOC) - Progression Note   Spoke to Dolanda with [employer] . NEB machine and supplies $72.62. Supervisor Erminio approved LOG .Dolanda will have [employer] deliver to room at 0930 . Secure chatted nurse.   TOC Pharmacy working on medications . Patient has no insurance. TOC Pharmacy working on providing scripts through [facility]  Patient Details  Name: Amy Flowers MRN: [id number] Date of Birth: 1966/05/15  Transition of Care [facility]) CM/SW Contact  Shivam Mestas, Powell Jansky, RN Phone Number: 11/04/2024, 9:16 AM  Clinical Narrative:       Expected Discharge Plan: Home/Self Care                 Expected Discharge Plan and Services         Expected Discharge Date: 11/04/24               DME Arranged: Nebulizer machine ELIGAH) DME Agency: [facility] Date DME Agency Contacted: 11/03/24 Time DME Agency Contacted: [phone] Representative spoke with at DME Agency: [place]             Social Drivers of Health (SDOH) Interventions SDOH Screenings   Food Insecurity: No Food Insecurity (10/31/2024)  Housing: Low Risk  (10/31/2024)  Transportation Needs: No Transportation Needs (10/31/2024)  Utilities: Not At Risk (10/31/2024)  Alcohol Screen: Low Risk  (10/14/2023)  Depression (PHQ2-9): Low Risk  (08/11/2024)  Financial Resource Strain: Low Risk  (11/24/2023)  Physical Activity: Sufficiently Active (11/24/2023)  Recent Concern: Physical Activity - Insufficiently Active (10/14/2023)  Social Connections: Socially Isolated (11/24/2023)  Stress: No Stress Concern Present (11/24/2023)  Tobacco Use: Low Risk  (10/31/2024)  Health Literacy: Adequate Health Literacy (10/14/2023)    Readmission Risk Interventions     No data to display

## 2024-11-07 ENCOUNTER — Telehealth: Payer: Self-pay

## 2024-11-07 NOTE — Transitions of Care (Post Inpatient/ED Visit) (Signed)
 11/07/2024  Name: Amy Flowers MRN: [id number] DOB: November 16, 1966  Today's TOC FU Call Status: Today's TOC FU Call Status:: Successful TOC FU Call Completed TOC FU Call Complete Date: 11/07/24  Patient's Name and Date of Birth confirmed. DOB, Name  Transition Care Management Follow-up Telephone Call Date of Discharge: 11/04/24 Discharge Facility: [facility]) Type of Discharge: Inpatient Admission Primary Inpatient Discharge Diagnosis:: right side rib pain. How have you been since you were released from the hospital?: Better (call completed with patient's daughter, Souzan. She said her mother is doing very well.) Any questions or concerns?: No  Items Reviewed: Did you receive and understand the discharge instructions provided?: Yes Medications obtained,verified, and reconciled?: Yes (Medications Reviewed) (She has all medications and did not have any questions about the med regime.  Souzan stated that her mother understands how to taper the prednisone .) Any new allergies since your discharge?: No Dietary orders reviewed?: Yes Type of Diet Ordered:: heart healthy, low sodium Do you have support at home?: Yes People in Home [RPT]: child(ren), adult Name of Support/Comfort Primary Source: her daughter, Souzan  Medications Reviewed Today: Medications Reviewed Today     Reviewed by Marvis Bradley, RN (Case Manager) on 11/07/24 at 1432  Med List Status: <None>   Medication Order Taking? Sig Documenting Provider Last Dose Status Informant  acetaminophen  (TYLENOL ) 500 MG tablet [id number]  Take 2 tablets (1,000 mg total) by mouth every 8 (eight) hours for 17 days. Vann, Jessica U, DO  Active   albuterol  (PROVENTIL ) (2.5 MG/3ML) 0.083% nebulizer solution [id number]  Take 3 mLs (2.5 mg total) by nebulization every 6 (six) hours as needed for wheezing or shortness of breath. Vann, Jessica U, DO  Active   benzonatate  (TESSALON ) 100 MG capsule [id number]  Take 1 capsule (100 mg  total) by mouth every 8 (eight) hours. Vann, Jessica U, DO  Active   budesonide  (PULMICORT ) 0.25 MG/2ML nebulizer solution [id number]  Combine 1 vial with albuterol  in nebulizer and take via nebulation twice daily Vann, Jessica U, DO  Active   famotidine  (PEPCID ) 20 MG tablet [id number]  Take 1 tablet (20 mg total) by mouth daily. Darlean Ozell NOVAK, MD  Active Child  guaiFENesin  (MUCINEX ) 600 MG 12 hr tablet [id number]  Take 1 tablet (600 mg total) by mouth 2 (two) times daily. Vann, Jessica U, DO  Active   lidocaine  (LIDODERM ) 5 % [id number]  Place 1 patch onto the skin daily. Remove & Discard patch within 12 hours or as directed by MD Juvenal Harlene PENNER, DO  Active   methocarbamol  (ROBAXIN ) 500 MG tablet [id number]  Take 0.5 tablets (250 mg total) by mouth 2 (two) times daily. Vann, Jessica U, DO  Active   oxyCODONE  (OXY IR/ROXICODONE ) 5 MG immediate release tablet [id number]  Take 0.5 tablets (2.5 mg total) by mouth every 6 (six) hours as needed for severe pain (pain score 7-10). Vann, Jessica U, DO  Active   pantoprazole  (PROTONIX ) 40 MG tablet [id number]  Take 1 tablet (40 mg total) by mouth daily. Take 30-60 min before first meal of the day Darlean Ozell NOVAK, MD  Active Child  predniSONE  (DELTASONE ) 10 MG tablet [id number]  Take 4 tablets (40 mg total) by mouth daily for 2 days, THEN 3 tablets (30 mg total) daily for 2 days, THEN 2 tablets (20 mg total) daily for 2 days, THEN 1 tablet (10 mg total) daily for 2 days, THEN 0.5 tablets (5 mg total) daily for 2 days. Vann, Jessica U,  DO  Active   promethazine -dextromethorphan (PROMETHAZINE -DM) 6.25-15 MG/5ML syrup [id number]  Take 5 mLs by mouth at bedtime as needed for cough.  Patient not taking: Reported on 10/31/2024   Darlean Ozell NOVAK, MD  Active Child            Home Care and Equipment/Supplies: Were Home Health Services Ordered?: No Any new equipment or medical supplies ordered?: Yes Name of Medical supply agency?: [employer] [facility] - nebulizer Were you  able to get the equipment/medical supplies?: Yes Do you have any questions related to the use of the equipment/supplies?: No  Functional Questionnaire: Do you need assistance with bathing/showering or dressing?: No Do you need assistance with meal preparation?: No Do you need assistance with eating?: No Do you have difficulty maintaining continence: No Do you need assistance with getting out of bed/getting out of a chair/moving?: No Do you have difficulty managing or taking your medications?: No  Follow up appointments reviewed: PCP Follow-up appointment confirmed?: Yes Date of PCP follow-up appointment?: 11/23/24 Follow-up Provider: [facility] Follow-up appointment confirmed?: Yes Date of Specialist follow-up appointment?: 12/14/24 Follow-Up Specialty Provider:: Pulmonary Do you need transportation to your follow-up appointment?: No Do you understand care options if your condition(s) worsen?: Yes-patient verbalized understanding  I initially called with Arabic interpreter: [id number]/[employer] and Souzan stated she ( Souzan)  did not need an interpreter, so I called back and spoke to [employer]. DPR on file to speak with Souzan    SIGNATURE [place], RN

## 2024-11-16 ENCOUNTER — Other Ambulatory Visit (HOSPITAL_COMMUNITY): Payer: Self-pay

## 2024-11-17 ENCOUNTER — Other Ambulatory Visit (HOSPITAL_COMMUNITY): Payer: Self-pay

## 2024-11-23 ENCOUNTER — Encounter: Payer: Self-pay | Admitting: Family Medicine

## 2024-11-23 ENCOUNTER — Ambulatory Visit: Payer: Self-pay | Attending: Family Medicine | Admitting: Family Medicine

## 2024-11-23 VITALS — BP 137/81 | HR 76 | Temp 97.9°F | Ht 61.0 in | Wt 146.8 lb

## 2024-11-23 DIAGNOSIS — K219 Gastro-esophageal reflux disease without esophagitis: Secondary | ICD-10-CM

## 2024-11-23 DIAGNOSIS — Z1231 Encounter for screening mammogram for malignant neoplasm of breast: Secondary | ICD-10-CM

## 2024-11-23 DIAGNOSIS — Z1211 Encounter for screening for malignant neoplasm of colon: Secondary | ICD-10-CM

## 2024-11-23 DIAGNOSIS — J8410 Pulmonary fibrosis, unspecified: Secondary | ICD-10-CM

## 2024-11-23 NOTE — Progress Notes (Signed)
 Subjective:  Patient ID: Amy Flowers, female    DOB: 07-19-1966  Age: 58 y.o. MRN: [id number]  CC: Hospitalization Follow-up     Discussed the use of AI scribe software for clinical note transcription with the patient, who gave verbal consent to proceed.  History of Present Illness Amy Flowers is a 58 year old female with pulmonary fibrosis, prediabetes, GERD/esophageal motility who presents for follow-up after a recent hospitalization for cough and rib pain. She is accompanied by her family, including her daughter and grandson.  She was recently hospitalized from 10/31/2024 through 10/27/2024 for persistent cough and right-sided rib pain. The cough has resolved and the rib pain has improved. During the hospitalization she required supplemental oxygen for low oxygen levels. CT chest showed no PE but findings consistent with interstitial lung disease and rib x-ray showed no fracture.  Her pain was treated with IV opioids with resulting hypoxia for which she was placed on oxygen supplementation with improvement.  She has pulmonary fibrosis and is scheduled to see pulmonology on January 7. She previously developed mouth swelling with 'an inhaler' and is now using a nebulizer instead. She completed a short course of cough medication from the hospitalization.  She takes Pepcid  (famotidine ) for acid reflux from a prior visit.  Recent November blood tests showed normal kidney function, normal cholesterol, and no evidence of infection.    Past Medical History:  Diagnosis Date   Aortic atherosclerosis 06/26/2017   GERD (gastroesophageal reflux disease) 02/14/2019   Hiatal hernia 02/14/2019   Vitamin D  deficiency 09/28/2017    Past Surgical History:  Procedure Laterality Date   APPENDECTOMY     DIAPHRAGMATIC HERNIA REPAIR      No family history on file.  Social History   Socioeconomic History   Marital status: Single    Spouse name: Not on file   Number  of children: Not on file   Years of education: Not on file   Highest education level: 12th grade  Occupational History   Not on file  Tobacco Use   Smoking status: Never    Passive exposure: Never   Smokeless tobacco: Never  Vaping Use   Vaping status: Never Used  Substance and Sexual Activity   Alcohol use: No   Drug use: No   Sexual activity: Not on file  Other Topics Concern   Not on file  Social History Narrative   Not on file   Social Drivers of Health   Tobacco Use: Low Risk (10/31/2024)   Patient History    Smoking Tobacco Use: Never    Smokeless Tobacco Use: Never    Passive Exposure: Never  Financial Resource Strain: Low Risk (11/24/2023)   Overall Financial Resource Strain (CARDIA)    Difficulty of Paying Living Expenses: Not hard at all  Food Insecurity: No Food Insecurity (10/31/2024)   Epic    Worried About Programme Researcher, Broadcasting/film/video in the Last Year: Never true    Ran Out of Food in the Last Year: Never true  Transportation Needs: No Transportation Needs (10/31/2024)   Epic    Lack of Transportation (Medical): No    Lack of Transportation (Non-Medical): No  Physical Activity: Sufficiently Active (11/24/2023)   Exercise Vital Sign    Days of Exercise per Week: 5 days    Minutes of Exercise per Session: 150+ min  Recent Concern: Physical Activity - Insufficiently Active (10/14/2023)   Exercise Vital Sign    Days of Exercise per Week:  3 days    Minutes of Exercise per Session: 30 min  Stress: No Stress Concern Present (11/24/2023)   [employer] of Occupational Health - Occupational Stress Questionnaire    Feeling of Stress : Not at all  Social Connections: Socially Isolated (11/24/2023)   Social Connection and Isolation Panel    Frequency of Communication with Friends and Family: Twice a week    Frequency of Social Gatherings with Friends and Family: Twice a week    Attends Religious Services: Never    Database Administrator or Organizations: No     Attends Banker Meetings: Never    Marital Status: Widowed  Depression (PHQ2-9): Low Risk (08/11/2024)   Depression (PHQ2-9)    PHQ-2 Score: 0  Alcohol Screen: Low Risk (10/14/2023)   Alcohol Screen    Last Alcohol Screening Score (AUDIT): 0  Housing: Low Risk (10/31/2024)   Epic    Unable to Pay for Housing in the Last Year: No    Number of Times Moved in the Last Year: 0    Homeless in the Last Year: No  Utilities: Not At Risk (10/31/2024)   Epic    Threatened with loss of utilities: No  Health Literacy: Adequate Health Literacy (10/14/2023)   B1300 Health Literacy    Frequency of need for help with medical instructions: Rarely    Allergies[1]  Outpatient Medications Prior to Visit  Medication Sig Dispense Refill   albuterol  (PROVENTIL ) (2.5 MG/3ML) 0.083% nebulizer solution Take 3 mLs (2.5 mg total) by nebulization every 6 (six) hours as needed for wheezing or shortness of breath. 360 mL 0   budesonide  (PULMICORT ) 0.25 MG/2ML nebulizer solution Combine 1 vial with albuterol  in nebulizer and take via nebulation twice daily 60 mL 0   famotidine  (PEPCID ) 20 MG tablet Take 1 tablet (20 mg total) by mouth daily. 30 tablet 11   methocarbamol  (ROBAXIN ) 500 MG tablet Take 0.5 tablets (250 mg total) by mouth 2 (two) times daily. 20 tablet 0   benzonatate  (TESSALON ) 100 MG capsule Take 1 capsule (100 mg total) by mouth every 8 (eight) hours. (Patient not taking: Reported on 11/23/2024) 30 capsule 0   guaiFENesin  (MUCINEX ) 600 MG 12 hr tablet Take 1 tablet (600 mg total) by mouth 2 (two) times daily. (Patient not taking: Reported on 11/23/2024) 60 tablet 0   lidocaine  (LIDODERM ) 5 % Place 1 patch onto the skin daily. Remove & Discard patch within 12 hours or as directed by MD (Patient not taking: Reported on 11/23/2024) 30 patch 0   oxyCODONE  (OXY IR/ROXICODONE ) 5 MG immediate release tablet Take 0.5 tablets (2.5 mg total) by mouth every 6 (six) hours as needed for severe pain (pain  score 7-10). (Patient not taking: Reported on 11/23/2024) 12 tablet 0   pantoprazole  (PROTONIX ) 40 MG tablet Take 1 tablet (40 mg total) by mouth daily. Take 30-60 min before first meal of the day (Patient not taking: Reported on 11/23/2024) 30 tablet 2   promethazine -dextromethorphan (PROMETHAZINE -DM) 6.25-15 MG/5ML syrup Take 5 mLs by mouth at bedtime as needed for cough. (Patient not taking: Reported on 11/23/2024) 240 mL 0   No facility-administered medications prior to visit.     ROS Review of Systems  Constitutional:  Negative for activity change and appetite change.  HENT:  Negative for sinus pressure and sore throat.   Respiratory:  Negative for chest tightness, shortness of breath and wheezing.   Cardiovascular:  Negative for chest pain and palpitations.  Gastrointestinal:  Negative  for abdominal distention, abdominal pain and constipation.  Genitourinary: Negative.   Musculoskeletal: Negative.   Psychiatric/Behavioral:  Negative for behavioral problems and dysphoric mood.     Objective:  BP 137/81   Pulse 76   Temp 97.9 F (36.6 C) (Oral)   Ht 5' 1 (1.549 m)   Wt 146 lb 12.8 oz (66.6 kg)   SpO2 96%   BMI 27.74 kg/m      11/23/2024    9:31 AM 11/04/2024    8:05 AM 11/04/2024    4:04 AM  BP/Weight  Systolic BP 137 123 122  Diastolic BP 81 68 75  Wt. (Lbs) 146.8    BMI 27.74 kg/m2        Physical Exam Constitutional:      Appearance: She is well-developed.  Cardiovascular:     Rate and Rhythm: Normal rate.     Heart sounds: Normal heart sounds. No murmur heard. Pulmonary:     Effort: Pulmonary effort is normal.     Breath sounds: Normal breath sounds. No wheezing or rales.  Chest:     Chest wall: No tenderness.  Abdominal:     General: Bowel sounds are normal. There is no distension.     Palpations: Abdomen is soft. There is no mass.     Tenderness: There is no abdominal tenderness.  Musculoskeletal:        General: Normal range of motion.      Right lower leg: No edema.     Left lower leg: No edema.  Neurological:     Mental Status: She is alert and oriented to person, place, and time.  Psychiatric:        Mood and Affect: Mood normal.        Latest Ref Rng & Units 10/31/2024   10:10 AM 09/06/2024    8:34 PM 07/28/2024   11:44 AM  CMP  Glucose 70 - 99 mg/dL 899  99  887   BUN 6 - 20 mg/dL 8  9  6    Creatinine 0.44 - 1.00 mg/dL 9.28  9.14  9.34   Sodium 135 - 145 mmol/L 141  138  140   Potassium 3.5 - 5.1 mmol/L 4.4  4.0  4.2   Chloride 98 - 111 mmol/L 102  99  106   CO2 22 - 32 mmol/L 29  26  22    Calcium 8.9 - 10.3 mg/dL 89.9  9.9  9.3   Total Protein 6.5 - 8.1 g/dL 7.9     Total Bilirubin 0.0 - 1.2 mg/dL 0.6     Alkaline Phos 38 - 126 U/L 81     AST 15 - 41 U/L 37     ALT 0 - 44 U/L 25       Lipid Panel     Component Value Date/Time   CHOL 181 08/11/2024 1015   TRIG 92 08/11/2024 1015   HDL 46 08/11/2024 1015   LDLCALC 118 (H) 08/11/2024 1015    CBC    Component Value Date/Time   WBC 5.6 10/31/2024 1010   RBC 4.82 10/31/2024 1010   HGB 14.8 10/31/2024 1010   HGB 14.1 02/26/2022 0845   HCT 44.6 10/31/2024 1010   HCT 40.4 02/26/2022 0845   PLT 151 10/31/2024 1010   PLT 187 02/26/2022 0845   MCV 92.5 10/31/2024 1010   MCV 93 02/26/2022 0845   MCH 30.7 10/31/2024 1010   MCHC 33.2 10/31/2024 1010   RDW 13.2 10/31/2024 1010  RDW 11.9 02/26/2022 0845   LYMPHSABS 1.5 10/31/2024 1010   LYMPHSABS 1.8 02/26/2022 0845   MONOABS 0.8 10/31/2024 1010   EOSABS 0.6 (H) 10/31/2024 1010   EOSABS 0.2 02/26/2022 0845   BASOSABS 0.0 10/31/2024 1010   BASOSABS 0.0 02/26/2022 0845    Lab Results  Component Value Date   HGBA1C 5.6 08/11/2024       Assessment & Plan Pulmonary fibrosis Recent hospitalization for cough and rib pain. Cough resolved, rib pain improved. Oxygen at 96%. No fractures or PE on imaging. -Currently not on steroids - Continue Pulmicort  nebulizer. - Follow-up with pulmonologist on  January 7th.  Gastroesophageal reflux disease Stable GERD managed with famotidine . - Continue famotidine .  General Health Maintenance Routine screenings discussed. Mammogram and stool test needed. Screening for breast cancer- Ordered mammogram. Screening for colon cancer- Provided stool test kit.      No orders of the defined types were placed in this encounter.   Follow-up: Return in about 1 year (around 11/23/2025) for Chronic medical conditions.       Corrina Sabin, MD, FAAFP. [facility] [place], [place] [phone]   11/23/2024, 1:11 PM     [1]  Allergies Allergen Reactions   Cyclobenzaprine  Hives and Itching   Symbicort  [Budesonide -Formoterol  Fumarate]     Per daughter caused her lips to swell

## 2024-11-23 NOTE — Patient Instructions (Signed)
 VISIT SUMMARY:  You came in today for a follow-up visit after your recent hospitalization for a persistent cough and rib pain. Your cough has resolved and your rib pain has improved. Your recent imaging showed no blood clots or fractures. You are currently using a nebulizer for your pulmonary fibrosis and taking Pepcid  for acid reflux. Your recent blood tests from November were normal.  YOUR PLAN:  -PULMONARY FIBROSIS: Pulmonary fibrosis is a lung disease that occurs when lung tissue becomes damaged and scarred, making it difficult to breathe. Your cough has resolved and your rib pain has improved. Your oxygen level is at 96%. Continue using the Albuterol  nebulizer and follow up with the pulmonologist on January 7th.  -GASTROESOPHAGEAL REFLUX DISEASE (GERD): GERD is a condition where stomach acid frequently flows back into the tube connecting your mouth and stomach, causing irritation. Continue taking famotidine  as prescribed.  -GENERAL HEALTH MAINTENANCE: Routine health screenings are important for early detection of potential health issues. A mammogram has been ordered for you, and you have been provided with a stool test kit for further screening.  INSTRUCTIONS:  Please follow up with the pulmonologist on January 7th. Complete the mammogram and stool test as discussed.

## 2024-11-24 ENCOUNTER — Other Ambulatory Visit (HOSPITAL_COMMUNITY): Payer: Self-pay

## 2024-11-26 ENCOUNTER — Ambulatory Visit (HOSPITAL_COMMUNITY)
Admission: EM | Admit: 2024-11-26 | Discharge: 2024-11-26 | Disposition: A | Discharge status: Home / Self Care | Payer: Self-pay | Attending: Emergency Medicine | Admitting: Emergency Medicine

## 2024-11-26 ENCOUNTER — Encounter (HOSPITAL_COMMUNITY): Payer: Self-pay | Admitting: Emergency Medicine

## 2024-11-26 DIAGNOSIS — J069 Acute upper respiratory infection, unspecified: Secondary | ICD-10-CM

## 2024-11-26 DIAGNOSIS — R053 Chronic cough: Secondary | ICD-10-CM

## 2024-11-26 DIAGNOSIS — R0602 Shortness of breath: Secondary | ICD-10-CM

## 2024-11-26 LAB — POC COVID19/FLU A&B COMBO
Covid Antigen, POC: NEGATIVE
Influenza A Antigen, POC: NEGATIVE
Influenza B Antigen, POC: NEGATIVE

## 2024-11-26 MED ORDER — ALBUTEROL SULFATE (2.5 MG/3ML) 0.083% IN NEBU: 2.5000 mg | INHALATION_SOLUTION | Freq: Four times a day (QID) | RESPIRATORY_TRACT | 0 refills | Status: DC | PRN

## 2024-11-26 MED ORDER — PREDNISONE 20 MG PO TABS: 40.0000 mg | ORAL_TABLET | Freq: Every day | ORAL | 0 refills | Status: AC

## 2024-11-26 MED ORDER — BENZONATATE 100 MG PO CAPS: 100.0000 mg | ORAL_CAPSULE | Freq: Three times a day (TID) | ORAL | 0 refills | Status: DC

## 2024-11-26 MED ORDER — ALBUTEROL SULFATE HFA 108 (90 BASE) MCG/ACT IN AERS: 1.0000 | INHALATION_SPRAY | Freq: Four times a day (QID) | RESPIRATORY_TRACT | 0 refills | Status: AC | PRN

## 2024-11-26 NOTE — ED Provider Notes (Signed)
 " [facility]    CSN: [id number] Arrival date & time: 11/26/24  1214      History   Chief Complaint Chief Complaint  Patient presents with   Cough    HPI Amy Flowers is a 58 y.o. female.   Patient presents with daughter for cough, nasal congestion, chest congestion, headache, subjective fever, and shortness of breath that began 3 days ago.  Denies chest pain, nausea, vomiting, diarrhea, and abdominal pain.    Denies any known sick exposures.  Patient reports that she has been taking Tylenol  with some relief.  Patient also reports that she used a nebulizer treatment this morning with some relief.  Patient reports that she is out of her nebulizer treatments.  Denies any known sick exposures.  Patient has a history of pulmonary fibrosis.  Patient is not on any medication chronically for this but does have albuterol  and budesonide  nebulizer treatments as needed.   The history is provided by the patient and medical records. The history is limited by a language barrier. No language interpreter was used (Declines interpreter, daughter is interpreting).  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      Home Medications    Prior to Admission medications  Medication Sig Start Date End Date Taking? Authorizing Provider  albuterol  (VENTOLIN  HFA) 108 (90 Base) MCG/ACT inhaler Inhale 1-2 puffs into the lungs every 6 (six) hours as needed for wheezing or shortness of breath. 11/26/24  Yes Johnie, Azlynn Mitnick A, NP  benzonatate  (TESSALON ) 100 MG capsule Take 1 capsule (100 mg total) by mouth every 8 (eight) hours. 11/26/24  Yes Johnie, Paidyn Mcferran A, NP  predniSONE  (DELTASONE ) 20 MG tablet Take 2 tablets (40 mg total) by mouth daily for 5 days. 11/26/24 12/01/24 Yes Johnie, Buffie Herne A, NP  albuterol  (PROVENTIL ) (2.5 MG/3ML) 0.083% nebulizer solution Take 3 mLs (2.5 mg total) by nebulization every 6 (six) hours as needed for wheezing or shortness of breath. 11/26/24   Johnie Flaming A, NP  budesonide  (PULMICORT ) 0.25 MG/2ML nebulizer solution Combine 1 vial with albuterol  in nebulizer and take via nebulation twice daily 11/04/24   Vann, Jessica U, DO  famotidine  (PEPCID ) 20 MG tablet Take 1 tablet (20 mg total) by mouth daily. 10/03/24   Darlean Ozell NOVAK, MD  methocarbamol  (ROBAXIN ) 500 MG tablet Take 0.5 tablets (250 mg total) by mouth 2 (two) times daily. 11/04/24   Vann, Jessica U, DO    Family History History reviewed. No pertinent family history.  Social History Social History[1]   Allergies  Cyclobenzaprine  and Symbicort  [budesonide -formoterol  fumarate]   Review of Systems Review of Systems  Respiratory:  Positive for cough.    Per HPI  Physical Exam Triage Vital Signs ED Triage Vitals  Encounter Vitals Group     BP 11/26/24 1307 118/74     Girls Systolic BP Percentile --      Girls Diastolic BP Percentile --      Boys Systolic BP Percentile --      Boys Diastolic BP Percentile --      Pulse Rate 11/26/24 1307 94     Resp  11/26/24 1307 18     Temp 11/26/24 1307 98 F (36.7 C)     Temp Source 11/26/24 1307 Oral     SpO2 11/26/24 1307 96 %     Weight --      Height --      Head Circumference --      Peak Flow --      Pain Score 11/26/24 1305 0     Pain Loc --      Pain Education --      Exclude from Growth Chart --    No data found.  Updated Vital Signs BP 118/74 (BP Location: Left Arm)   Pulse 94   Temp 98 F (36.7 C) (Oral)   Resp 18   SpO2 96%   Visual Acuity Right Eye Distance:   Left Eye Distance:   Bilateral Distance:    Right Eye Near:   Left Eye Near:    Bilateral Near:     Physical Exam Vitals and nursing note reviewed.  Constitutional:      General: She is awake. She is not in acute distress.    Appearance: Normal appearance. She is well-developed and well-groomed. She is not ill-appearing.  HENT:     Right Ear: Tympanic membrane, ear canal and external ear normal.     Left Ear: Tympanic membrane, ear canal and external ear normal.     Nose: Congestion and rhinorrhea present.     Mouth/Throat:     Mouth: Mucous membranes are moist.     Pharynx: Posterior oropharyngeal erythema and postnasal drip present. No oropharyngeal exudate.  Cardiovascular:     Rate and Rhythm: Normal rate and regular rhythm.  Pulmonary:     Effort: Pulmonary effort is normal.     Breath sounds: Normal breath sounds.  Skin:    General: Skin is warm and dry.  Neurological:     General: No focal deficit present.     Mental Status: She is alert and oriented to person, place, and time. Mental status is at baseline.  Psychiatric:        Behavior: Behavior is cooperative.      UC Treatments / Results  Labs (all labs ordered are listed, but only abnormal results are displayed) Labs Reviewed  POC COVID19/FLU A&B COMBO    EKG   Radiology No results found.  Procedures Procedures (including critical care time)  Medications Ordered in UC Medications - No data to display  Initial  Impression / Assessment and Plan / UC Course  I have reviewed the triage vital signs and the nursing notes.  Pertinent labs & imaging results that were available during my care of the patient were reviewed by me and considered in my medical decision making (see chart for details).     Patient is overall well-appearing.  Vitals are stable.  Flu and COVID testing negative.  Suspect symptoms are likely viral in  nature and are exacerbating underlying lung disease.  Prescribed prednisone  burst to assist with shortness of breath.  Prescribed albuterol  inhaler to use as needed for shortness of breath and wheezing.  Refilled nebulizer treatments as well.  Prescribed Tessalon  as needed for cough.  Recommended following up with primary care/pulmonology for further evaluation management if symptoms continue.  Discussed follow-up, return, and strict ER precautions. Final Clinical Impressions(s) / UC Diagnoses   Final diagnoses:  Viral URI with cough  Shortness of breath     Discharge Instructions      ??? ??????? ????? ?? ?????? ?????? ??? ?????? ????? ??????? ?? ?????? ??????? ??? ??? ????? ??? ????? ?????? ????. ???? ?????? ????? ?? ???? ????????? ??? ????? ?????? ???? 5 ???? ???????? ?? ????? ??? ??????. ????? ??????? ???? ????????? ?????? ???? ??? ?????? ?? 4-6 ????? ??? ?????? ?????? ?????? ???? ??????. ??? ???? ?????? ???? ????? ????????? ????????? ???? ????? ???????? ?? 6 ????? ??? ?????? ?????? ?????? ???? ?????? ?????. ????? ????? ???? ??????? ?? 8 ????? ??? ?????? ?????? ??????. ???? ????? ?? ???? ??????? ??????? ?? ?? ??? ??? ??? ??????. ??? ????? ?????? ????? ??????? ?? ??? ???? ?? ?????? ?? ??? ??????? ?? ??? ????? ????? ?????? ????? ??? ??? ??????? ????? ?????? ?????. bettey snipe, 'aetaqid 'ana 'aeradak murtabitat ealaa al'arjah bieadwaa fayrusiat fi aljihaz altanafusii 'adat 'iilaa tafaqum marad alriyat almuzmin lidayk. abda bitanawul qursayn min dawa' bridnizun maratan wahidatan  ywmyan limudat 5 'ayaam lilmusaeadat fi takhfif diq altanafusi. [place] astikhdam bikhakh 'albutirul bimiqdar nafkhat 'iilaa nafkhatayn kula 4-6 saeat hasab alhajat litakhfif alsufayr wadiq altanafusi. laqad qmt bitajdid wasfat mahlul 'albutirul lilaistinshaq aladhi [place] aistikhdamuh kula 6 saeat hasab alhajat litakhfif alsufayr wadiq altanafus aydan. [place] tanawul dawa' tisalun kula 8 saeat hasab alhajat litakhfif alsieali. tabie [place] mae tabib alrieayat al'awaliat 'aw eid 'iilaa huna eind alhajati. 'iidha shert bitafaqum sueubat altanafusi, 'aw 'alam shadid fi alsadri, 'aw humana mustamiratan, 'aw duef shadida, yurja altawajuh fwran 'iilaa qism altawari litalaqiy aleilaj altabiy.  As discussed I believe your symptoms are likely related to a viral respiratory illness that has exacerbated your underlying lung disease.   Start taking 2 tablets of prednisone  once daily for 5 days to help with shortness of breath. You can use albuterol  inhaler 1 to 2 puffs every 4-6 hours as needed for wheezing and shortness of breath. I refilled albuterol  nebulizer solution that you can use every 6 hours as needed for wheezing and shortness of breath as well. You can take Tessalon  every 8 hours as needed for cough. Follow-up with your primary care provider or return here as needed. If you develop worsening difficulty breathing, severe chest pain, persistent fevers, or severe weakness please seek immediate medical treatment in the emergency department.   ED Prescriptions     Medication Sig Dispense Auth. Provider   albuterol  (PROVENTIL ) (2.5 MG/3ML) 0.083% nebulizer solution Take 3 mLs (2.5 mg total) by nebulization every 6 (six) hours as needed for wheezing or shortness of breath. 360 mL Johnie Flaming A, NP   albuterol  (VENTOLIN  HFA) 108 (90 Base) MCG/ACT inhaler Inhale 1-2 puffs into the lungs every 6 (six) hours as needed for wheezing or shortness of breath. 18 g Johnie Flaming A, NP   predniSONE   (DELTASONE ) 20 MG tablet Take 2 tablets (40 mg total) by mouth daily for 5 days. 10 tablet Johnie Flaming A, NP   benzonatate  (TESSALON ) 100 MG capsule Take 1 capsule (100 mg total) by mouth every 8 (eight) hours. 21 capsule [place], [address]  A, NP      PDMP not reviewed this encounter.    [1]  Social History Tobacco Use   Smoking status: Never    Passive exposure: Never   Smokeless tobacco: Never  Vaping Use   Vaping status: Never Used  Substance Use Topics   Alcohol use: No   Drug use: No     Johnie Flaming A, NP 11/26/24 1350  "

## 2024-11-26 NOTE — Discharge Instructions (Signed)
??? ??????? ????? ?? ?????? ?????? ??? ?????? ????? ??????? ?? ?????? ??????? ??? ??? ????? ??? ????? ?????? ????. °???? ?????? ????? ?? ???? ????????? ??? ????? ?????? ????   5 ???? ???????? ?? ????? ??? ??????. ????? ??????? ???? ????????? ?????? ???? ??? ?????? ?? 4-6 ????? ??? ?????? ?????? ?????? ???? ??????. ??? ???? ?????? ???? ????? ????????? ????????? ???? ????? ???????? ?? 6 ????? ??? ?????? ?????? ?????? ???? ?????? ?????. ????? ????? ???? ??????? ?? 8 ????? ??? ?????? ?????? ??????. ???? ????? ?? ???? ??????? ??????? ?? ?? ??? ??? ??? ??????. ??? ????? ?????? ????? ??????? ?? ??? ???? ?? ?????? ?? ??? ??????? ?? ??? ????? ????? ?????? ????? ??? ??? ??????? ????? ?????? ?????. bettey snipe, 'aetaqid 'ana 'aeradak murtabitat ealaa al'arjah bieadwaa fayrusiat fi bernerd altanafusii 'adat 'iilaa tafaqum marad alriyat almuzmin lidayk. abda bitanawul qursayn min dawa' bridnizun maratan wahidatan ywmyan limudat 5 'ayaam lilmusaeadat fi takhfif diq altanafusi. [place] astikhdam bikhakh 'albutirul bimiqdar nafkhat 'iilaa nafkhatayn kula 4-6 saeat hasab alhajat litakhfif alsufayr wadiq altanafusi. laqad qmt bitajdid wasfat mahlul 'albutirul lilaistinshaq aladhi [place] aistikhdamuh kula 6 saeat hasab alhajat litakhfif alsufayr wadiq altanafus aydan. [place] tanawul dawa' tisalun kula 8 saeat hasab alhajat litakhfif alsieali. tabie [place] mae tabib alrieayat al'awaliat 'aw eid 'iilaa huna eind alhajati. 'iidha shert bitafaqum sueubat altanafusi, 'aw 'alam shadid fi alsadri, 'aw humana mustamiratan, 'aw duef shadida, yurja altawajuh fwran 'iilaa qism altawari litalaqiy aleilaj altabiy.  As discussed I believe your symptoms are likely related to a viral respiratory illness that has exacerbated your underlying lung disease.   Start taking 2 tablets of prednisone  once daily for 5 days to help with shortness of breath. You can use albuterol  inhaler 1 to 2 puffs every 4-6 hours as needed for wheezing  and shortness of breath. I refilled albuterol  nebulizer solution that you can use every 6 hours as needed for wheezing and shortness of breath as well. You can take Tessalon  every 8 hours as needed for cough. Follow-up with your primary care provider or return here as needed. If you develop worsening difficulty breathing, severe chest pain, persistent fevers, or severe weakness please seek immediate medical treatment in the emergency department.

## 2024-11-26 NOTE — ED Triage Notes (Signed)
 Pt had fevers, cough, congestion, headache and SOB for 3 days. Used nebulize and took tylenol  for headache-last dose 4am.

## 2024-12-06 ENCOUNTER — Observation Stay (HOSPITAL_COMMUNITY)
Admission: EM | Admit: 2024-12-06 | Discharge: 2024-12-06 | Disposition: A | Discharge status: Home / Self Care | Payer: Self-pay | Attending: Family Medicine | Admitting: Family Medicine

## 2024-12-06 ENCOUNTER — Emergency Department (HOSPITAL_COMMUNITY): Payer: Self-pay

## 2024-12-06 ENCOUNTER — Other Ambulatory Visit: Payer: Self-pay

## 2024-12-06 ENCOUNTER — Encounter (HOSPITAL_COMMUNITY): Payer: Self-pay | Admitting: Emergency Medicine

## 2024-12-06 DIAGNOSIS — J069 Acute upper respiratory infection, unspecified: Principal | ICD-10-CM | POA: Insufficient documentation

## 2024-12-06 DIAGNOSIS — R0603 Acute respiratory distress: Secondary | ICD-10-CM

## 2024-12-06 DIAGNOSIS — Z8709 Personal history of other diseases of the respiratory system: Secondary | ICD-10-CM

## 2024-12-06 DIAGNOSIS — J849 Interstitial pulmonary disease, unspecified: Secondary | ICD-10-CM

## 2024-12-06 DIAGNOSIS — R062 Wheezing: Secondary | ICD-10-CM | POA: Diagnosis present

## 2024-12-06 DIAGNOSIS — Z79899 Other long term (current) drug therapy: Secondary | ICD-10-CM | POA: Insufficient documentation

## 2024-12-06 DIAGNOSIS — J9601 Acute respiratory failure with hypoxia: Principal | ICD-10-CM | POA: Insufficient documentation

## 2024-12-06 LAB — TROPONIN T, HIGH SENSITIVITY: Troponin T High Sensitivity: 26 ng/L — ABNORMAL HIGH (ref 0–19)

## 2024-12-06 LAB — CBC WITH DIFFERENTIAL/PLATELET
Abs Immature Granulocytes: 0.05 K/uL (ref 0.00–0.07)
Basophils Absolute: 0 K/uL (ref 0.0–0.1)
Basophils Relative: 1 %
Eosinophils Absolute: 1.3 K/uL — ABNORMAL HIGH (ref 0.0–0.5)
Eosinophils Relative: 18 %
HCT: 45.3 % (ref 36.0–46.0)
Hemoglobin: 14.9 g/dL (ref 12.0–15.0)
Immature Granulocytes: 1 %
Lymphocytes Relative: 37 %
Lymphs Abs: 2.7 K/uL (ref 0.7–4.0)
MCH: 31.4 pg (ref 26.0–34.0)
MCHC: 32.9 g/dL (ref 30.0–36.0)
MCV: 95.4 fL (ref 80.0–100.0)
Monocytes Absolute: 0.9 K/uL (ref 0.1–1.0)
Monocytes Relative: 13 %
Neutro Abs: 2.1 K/uL (ref 1.7–7.7)
Neutrophils Relative %: 30 %
Platelets: 249 K/uL (ref 150–400)
RBC: 4.75 MIL/uL (ref 3.87–5.11)
RDW: 14.2 % (ref 11.5–15.5)
WBC: 7.1 K/uL (ref 4.0–10.5)
nRBC: 0 % (ref 0.0–0.2)

## 2024-12-06 LAB — COMPREHENSIVE METABOLIC PANEL WITH GFR
ALT: 27 U/L (ref 0–44)
AST: 33 U/L (ref 15–41)
Albumin: 4.1 g/dL (ref 3.5–5.0)
Alkaline Phosphatase: 78 U/L (ref 38–126)
Anion gap: 12 (ref 5–15)
BUN: 7 mg/dL (ref 6–20)
CO2: 24 mmol/L (ref 22–32)
Calcium: 9.7 mg/dL (ref 8.9–10.3)
Chloride: 103 mmol/L (ref 98–111)
Creatinine, Ser: 0.78 mg/dL (ref 0.44–1.00)
GFR, Estimated: >60 mL/min
Glucose, Bld: 138 mg/dL — ABNORMAL HIGH (ref 70–99)
Potassium: 3.8 mmol/L (ref 3.5–5.1)
Sodium: 139 mmol/L (ref 135–145)
Total Bilirubin: 0.5 mg/dL (ref 0.0–1.2)
Total Protein: 7.5 g/dL (ref 6.5–8.1)

## 2024-12-06 LAB — RESP PANEL BY RT-PCR (RSV, FLU A&B, COVID)  RVPGX2
Influenza A by PCR: NEGATIVE
Influenza B by PCR: NEGATIVE
Resp Syncytial Virus by PCR: NEGATIVE
SARS Coronavirus 2 by RT PCR: NEGATIVE

## 2024-12-06 LAB — PRO BRAIN NATRIURETIC PEPTIDE: Pro Brain Natriuretic Peptide: <50 pg/mL

## 2024-12-06 MED ORDER — IPRATROPIUM-ALBUTEROL 0.5-2.5 (3) MG/3ML IN SOLN
RESPIRATORY_TRACT | Status: AC
  Administered 2024-12-06: 3 mL
  Filled 2024-12-06: qty 3

## 2024-12-06 MED ORDER — ALBUTEROL SULFATE (2.5 MG/3ML) 0.083% IN NEBU
2.5000 mg | INHALATION_SOLUTION | Freq: Once | RESPIRATORY_TRACT | Status: AC
  Administered 2024-12-06: 2.5 mg via RESPIRATORY_TRACT
  Filled 2024-12-06: qty 3

## 2024-12-06 MED ORDER — METHYLPREDNISOLONE SODIUM SUCC 125 MG IJ SOLR: 125.0000 mg | Freq: Once | INTRAMUSCULAR | Status: AC

## 2024-12-06 MED ORDER — BENZONATATE 100 MG PO CAPS: 100.0000 mg | ORAL_CAPSULE | Freq: Three times a day (TID) | ORAL | 0 refills | Status: DC | PRN

## 2024-12-06 MED ORDER — MAGNESIUM SULFATE 2 GM/50ML IV SOLN
INTRAVENOUS | Status: AC
  Administered 2024-12-06: 2 g via INTRAVENOUS
  Filled 2024-12-06: qty 50

## 2024-12-06 MED ORDER — MAGNESIUM SULFATE 2 GM/50ML IV SOLN: 2.0000 g | Freq: Once | INTRAVENOUS | Status: AC

## 2024-12-06 MED ORDER — IPRATROPIUM-ALBUTEROL 0.5-2.5 (3) MG/3ML IN SOLN
3.0000 mL | RESPIRATORY_TRACT | Status: DC | PRN
  Administered 2024-12-06: 3 mL via RESPIRATORY_TRACT
  Filled 2024-12-06: qty 3

## 2024-12-06 MED ORDER — METHYLPREDNISOLONE SODIUM SUCC 125 MG IJ SOLR
INTRAMUSCULAR | Status: AC
  Administered 2024-12-06: 125 mg via INTRAVENOUS
  Filled 2024-12-06: qty 2

## 2024-12-06 MED ORDER — GUAIFENESIN ER 600 MG PO TB12: 600.0000 mg | ORAL_TABLET | Freq: Two times a day (BID) | ORAL | 0 refills | Status: AC

## 2024-12-06 MED ORDER — ALBUTEROL SULFATE (2.5 MG/3ML) 0.083% IN NEBU: 2.5000 mg | INHALATION_SOLUTION | Freq: Four times a day (QID) | RESPIRATORY_TRACT | 11 refills | Status: AC | PRN

## 2024-12-06 NOTE — ED Notes (Signed)
 Pt provided with discharge and follow up instructions, medications discussed, pt verbalized understanding. LDA removed, VSS, pt ambulatory out of ED w/ all paperwork and belongings in NAD.

## 2024-12-06 NOTE — ED Notes (Signed)
 Pt reports sudden onset of worsening sob and wheezing. RA saturation was 69% on RA. Pt placed on a NRB and taken back to a treatment room. Margit, PA at bedside and RT on the way.

## 2024-12-06 NOTE — Discharge Summary (Signed)
 " Physician Discharge Summary   Patient: Amy Flowers MRN: [id number] DOB: 05-23-1966  Admit date:     12/06/2024  Discharge date: 12/06/2024  Discharge Physician: Lonni SHAUNNA Dalton   PCP: Delbert Clam, MD     Recommendations at discharge:  Follow-up with pulmonology, Dr. Theophilus in 1 week for URI, interstitial lung disease     Discharge Diagnoses: Principal Problem:   Acute respiratory failure with hypoxia (HCC) Active Problems:   Interstitial lung disease [facility])      Hospital Course: 58 y.o. [place]ese F with recent diagnosis ILD not yet differentiated presented with URI.  Whole family has been sick with URI symptoms, in the ER, chest x-ray unchanged, electrolytes and CBC normal.  She improved with albuterol .      Case was discussed with pulmonology, recommended avoiding prolonged steroids in the setting of upcoming PFT.  Patient and family reported improvement with albuterol , not with recent steroid burst.  After albuterol , the patient was able to ambulate in the hallway, with normal oxygen saturations.  Discussed with family, patient and family comfortable for discharge home.  She has nebulizer and pulse oximeter at home.  She was provided with refills of albuterol , as well as Tessalon  and Mucinex .  She has close follow-up with pulmonology in 1 week.            The   Controlled Substances Registry was reviewed for this patient prior to discharge.  Consultants: None   Disposition: Home Diet recommendation:  Regular diet  DISCHARGE MEDICATION: Allergies as of 12/06/2024       Reactions   Cyclobenzaprine  Hives, Itching   Symbicort  [budesonide -formoterol  Fumarate] Other (See Comments)   Per daughter caused her lips to swell        Medication List     PAUSE taking these medications    budesonide  0.25 MG/2ML nebulizer solution Wait to take this until your doctor or other care provider tells you to start  again. Commonly known as: Pulmicort  Combine 1 vial with albuterol  in nebulizer and take via nebulation twice daily       TAKE these medications    acetaminophen  325 MG tablet Commonly known as: TYLENOL  Take 650 mg by mouth every 6 (six) hours as needed for headache.   albuterol  108 (90 Base) MCG/ACT inhaler Commonly known as: VENTOLIN  HFA Inhale 1-2 puffs into the lungs every 6 (six) hours as needed for wheezing or shortness of breath.   albuterol  (2.5 MG/3ML) 0.083% nebulizer solution Commonly known as: PROVENTIL  Take 3 mLs (2.5 mg total) by nebulization every 6 (six) hours as needed for wheezing or shortness of breath.   benzonatate  100 MG capsule Commonly known as: TESSALON  Take 1 capsule (100 mg total) by mouth 3 (three) times daily as needed for cough. What changed:  when to take this reasons to take this   famotidine  20 MG tablet Commonly known as: Pepcid  Take 1 tablet (20 mg total) by mouth daily.   guaiFENesin  600 MG 12 hr tablet Commonly known as: Mucinex  Take 1 tablet (600 mg total) by mouth 2 (two) times daily.   ibuprofen  200 MG tablet Commonly known as: ADVIL  Take 400 mg by mouth every 6 (six) hours as needed for headache.   methocarbamol  500 MG tablet Commonly known as: ROBAXIN  Take 0.5 tablets (250 mg total) by mouth 2 (two) times daily.        Follow-up Information     Mannam, Praveen, MD Follow up.   Specialty: Pulmonary Disease Contact information:  [address] [place] [address] [phone]                 Discharge Instructions     Discharge instructions   Complete by: As directed    **IMPORTANT DISCHARGE INSTRUCTIONS**   From Dr. Jonel: You were evaluated for trouble breathing  Here, we found that you had a viral respiratory infection  It seems much better.  I have sent refills for albuterol  liquid for your nebulizer to [facility] on [address] Use this three times daily for the next week If you need  extra doses in between for shortness of breath, you may take it  Take Tessalon  perles (the pill with little yellow beads in it) to reduce cough  Take guiafenesin/Mucinex  600 mg twice daily to break up mucus If the pharmacy doesn't dispense this, you can pick any guaifenesin  product at the pharmacy 600 mg twice daily  Drink hot liquids Drink lots of fluids to stay hydrated  Go see Dr. Theophilus in 1 week   Increase activity slowly   Complete by: As directed        Discharge Exam: BP 134/76   Pulse 99   Temp 98.1 F (36.7 C) (Oral)   Resp 20   SpO2 97%    General: Pt is alert, awake, not in acute distress Cardiovascular: RRR, nl S1-S2, no murmurs appreciated.   No LE edema.   Respiratory: Normal respiratory rate and rhythm.  Coarse wheezing on exam. Abdominal: Abdomen soft and non-tender.  No distension or HSM.   Neuro/Psych: Strength symmetric in upper and lower extremities.  Judgment and insight appear normal.   Condition at discharge: fair  The results of significant diagnostics from this hospitalization (including imaging, microbiology, ancillary and laboratory) are listed below for reference.   Imaging Studies: DG Chest 1 View Result Date: 12/06/2024 EXAM: 1 VIEW(S) XRAY OF THE CHEST 12/06/2024 07:05:00 AM COMPARISON: 11/02/2024 CLINICAL HISTORY: SOB (shortness of breath) FINDINGS: LUNGS AND PLEURA: Diffuse interstitial coarsening with bronchial wall thickening. No consolidative change. No pleural effusion. No pneumothorax. HEART AND MEDIASTINUM: Aortic arch atherosclerosis. No acute abnormality of the cardiac silhouette. BONES AND SOFT TISSUES: No acute osseous abnormality. IMPRESSION: 1. Diffuse interstitial coarsening with bronchial wall thickening. No consolidative change. Electronically signed by: Waddell Calk MD 12/06/2024 07:09 AM EST RP Workstation: [id number]    Microbiology: Results for orders placed or performed during the hospital encounter of 12/06/24  Resp  panel by RT-PCR (RSV, Flu A&B, Covid) Anterior Nasal Swab     Status: None   Collection Time: 12/06/24 11:01 AM   Specimen: Anterior Nasal Swab  Result Value Ref Range Status   SARS Coronavirus 2 by RT PCR NEGATIVE NEGATIVE Final   Influenza A by PCR NEGATIVE NEGATIVE Final   Influenza B by PCR NEGATIVE NEGATIVE Final    Comment: (NOTE) The Xpert Xpress SARS-CoV-2/FLU/RSV plus assay is intended as an aid in the diagnosis of influenza from Nasopharyngeal swab specimens and should not be used as a sole basis for treatment. Nasal washings and aspirates are unacceptable for Xpert Xpress SARS-CoV-2/FLU/RSV testing.  Fact Sheet for Patients: bloggercourse.com  Fact Sheet for Healthcare Providers: seriousbroker.it  This test is not yet approved or cleared by the [place]  FDA and has been authorized for detection and/or diagnosis of SARS-CoV-2 by FDA under an Emergency Use Authorization (EUA). This EUA will remain in effect (meaning this test can be used) for the duration of the COVID-19 declaration under Section 564(b)(1) of  the Act, 21 U.S.C. section 360bbb-3(b)(1), unless the authorization is terminated or revoked.     Resp Syncytial Virus by PCR NEGATIVE NEGATIVE Final    Comment: (NOTE) Fact Sheet for Patients: bloggercourse.com  Fact Sheet for Healthcare Providers: seriousbroker.it  This test is not yet approved or cleared by the [place]  FDA and has been authorized for detection and/or diagnosis of SARS-CoV-2 by FDA under an Emergency Use Authorization (EUA). This EUA will remain in effect (meaning this test can be used) for the duration of the COVID-19 declaration under Section 564(b)(1) of the Act, 21 U.S.C. section 360bbb-3(b)(1), unless the authorization is terminated or revoked.  Performed at [facility], [address] [place], [place] [address]      Labs: CBC: Recent Labs  Lab 12/06/24 0623  WBC 7.1  NEUTROABS 2.1  HGB 14.9  HCT 45.3  MCV 95.4  PLT 249   Basic Metabolic Panel: Recent Labs  Lab 12/06/24 0623  NA 139  K 3.8  CL 103  CO2 24  GLUCOSE 138*  BUN 7  CREATININE 0.78  CALCIUM 9.7   Liver Function Tests: Recent Labs  Lab 12/06/24 0623  AST 33  ALT 27  ALKPHOS 78  BILITOT 0.5  PROT 7.5  ALBUMIN 4.1   CBG: No results for input(s): GLUCAP in the last 168 hours.  Discharge time spent: approximately 45 minutes spent on discharge counseling, evaluation of patient on day of discharge, and coordination of discharge planning with nursing, social work, pharmacy and case management  Signed: Lonni SHAUNNA Dalton, MD [facility] 12/06/2024         "

## 2024-12-06 NOTE — ED Triage Notes (Signed)
 Patient presents wheezing with chest congestion /SOB onset this week .

## 2024-12-06 NOTE — ED Notes (Signed)
 Placed on bipap

## 2024-12-06 NOTE — H&P (Addendum)
 " History and Physical    Patient: Amy Flowers FMW:[id number] DOB: 1966-01-31 DOA: 12/06/2024 DOS: the patient was seen and examined on 12/06/2024 PCP: Delbert Clam, MD  Patient coming from: Home  Chief Complaint:  Chief Complaint  Patient presents with   Wheezing       HPI:  58 y.o. 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   New cough in the last few months, very distressing, referred to Pulmonology, HRCT showed ILD, still in process of work up.  Then in last week, developed URI symptoms (everyone in the house is sick).  Today symptoms way worse, came to the ER, in triage, SpO2 69% reportedly, placed on BiPAP due to WOB.    CXR with diffuse interstitial thickening, stable from baseline, no pneumonia.  Troponin with trivial elevation.  ECG ST otherwise normal.  BMP and CBC normal.  COVID/flu/RSV negative.  BNP normal.  Given Solumedrol, magnesium  and albuterol  and able to wean to 2L.  Albuterol  helped immediately and she was able to come off BiPAP but still requiring oxygen.             Review of Systems  Constitutional:  Positive for malaise/fatigue. Negative for fever.  HENT:  Positive for congestion and sore throat.   Respiratory:  Positive for cough, sputum production, shortness of breath and wheezing. Negative for hemoptysis.   Cardiovascular:  Negative for chest pain and leg swelling.  All other systems reviewed and are negative.    Past Medical History:  Diagnosis Date   Aortic atherosclerosis 06/26/2017   GERD (gastroesophageal reflux disease) 02/14/2019   Hiatal hernia 02/14/2019   Vitamin D  deficiency 09/28/2017   Past Surgical History:  Procedure Laterality Date   APPENDECTOMY     DIAPHRAGMATIC HERNIA REPAIR     Social History:  reports that she has never smoked. She has never been exposed to tobacco smoke. She has never used smokeless tobacco. She reports that she does not drink  alcohol and does not use drugs.  Allergies[1] Flexerill and Advair   History reviewed. No pertinent family history.  Prior to Admission medications  Medication Sig Start Date End Date Taking? Authorizing Provider  acetaminophen  (TYLENOL ) 325 MG tablet Take 650 mg by mouth every 6 (six) hours as needed for headache.   Yes [provider]  albuterol  (PROVENTIL ) (2.5 MG/3ML) 0.083% nebulizer solution Take 3 mLs (2.5 mg total) by nebulization every 6 (six) hours as needed for wheezing or shortness of breath. 11/26/24  Yes Johnie, Carley A, NP  albuterol  (VENTOLIN  HFA) 108 (90 Base) MCG/ACT inhaler Inhale 1-2 puffs into the lungs every 6 (six) hours as needed for wheezing or shortness of breath. 11/26/24  Yes Johnie Flaming A, NP  budesonide  (PULMICORT ) 0.25 MG/2ML nebulizer solution Combine 1 vial with albuterol  in nebulizer and take via nebulation twice daily 11/04/24  Yes Vann, Jessica U, DO  famotidine  (PEPCID ) 20 MG tablet Take 1 tablet (20 mg total) by mouth daily. 10/03/24  Yes Darlean Ozell NOVAK, MD  ibuprofen  (ADVIL ) 200 MG tablet Take 400 mg by mouth every 6 (six) hours as needed for headache.   Yes [provider]  benzonatate  (TESSALON ) 100 MG capsule Take 1 capsule (100 mg total) by mouth every 8 (eight) hours. Patient not taking: Reported on 12/06/2024 11/26/24   Johnie Flaming A, NP  methocarbamol  (ROBAXIN ) 500 MG tablet Take 0.5 tablets (250 mg total) by mouth 2 (two) times daily. Patient not taking: Reported  on 12/06/2024 11/04/24   Vann, Jessica U, DO    Physical Exam: Vitals:   12/06/24 1030 12/06/24 1045 12/06/24 1118 12/06/24 1200  BP:  (!) 149/84    Pulse: (!) 114 (!) 104  (!) 103  Resp: (!) 33 (!) 25  (!) 22  Temp:   98.2 F (36.8 C)   TempSrc:   Axillary   SpO2: 100% 100%  98%   General appearance: tired, lying in bed,  alert and in no distress.  Responds appropriately to questions.  Eye contact, dress and hygiene appropriate.  Refuses interpreter  despite insistence HEENT:  Anicteric, conjunctivae and sclerae normal without injection or icterus, lids and lashes normal.  Visual tracking smooth.  OP moist without lesions, dentition normal, lips normal, normal auditory acuity   Cor:  RRR, without murmurs, rubs.  JVP normal, no LE edema Resp: Tachypneic, coarse wheezing throughout with inspiration and expiration, no rales anywhere.    Abd:  No TTP or rebound all quadrants.  No masses or organomegaly.   No ascites, distension.   MSK: Symmetrical without gross deformities of the hands, large joints, or legs. Skin:  cap refill normal, Skin intact without significant rashes or lesions. Neuro:  Speech is fluent.  Naming is grossly intact, patient's recall, both recent and remote, seem within normal limits.  Muscle tone normal, face symmetric  Psych:  Attention span and concentration are within normal limits.  Affect very tired.  appropriate thought content and normal rate of speech, thought process linear           Data Reviewed: Basic metabolic panel shows normal electrolytes and renal function proBNP negative Troponin significantly elevated CBC normal COVID, flu, RSV PCR negative Chest x-ray, personally reviewed, shows coarse interstitial markings, no lobar infiltrates, no change from 2 months ago EKG, shows sinus tachycardia, personally reviewed, no change, no ST changes      Assessment and Plan: Acute respiratory failure with hypoxia due to URI Newly diagnosed interstitial lung disease Patient symptomatically has a viral URI.  From review of her pulmonology notes, she has an interstitial lung disease which is as yet undifferentiated, there is a some weakly positive ANCA, but I do not see that pulmonology suspects vasculitis  Also see peripheral eosinophilia, but no history of asthma or COPD or smoking (family report she has always been extremely healthy). - DuoNeb as needed - Supportive care - Robitussin - Will discuss with  pulmonology whether steroids are warranted in this case, I would assume they are not in a viral URI, but given there is a question of whether she has bronchospastic disease (PFTs are pending in 1 week), and her ILD is as yet undifferentiated, I will defer to their judgment  ADDENDUM: Discussed with pulm: Avoid prednisone  too close to PFTs next week Prednisone  20 mg for 2-3 days is okay Has nebulizer at home Will need refills of albuterol          Advance Care Planning: FULL  Consults: None  Family Communication: Daughter  Severity of Illness: The appropriate patient status for this patient is OBSERVATION. Observation status is judged to be reasonable and necessary in order to provide the required intensity of service to ensure the patient's safety. The patient's presenting symptoms, physical exam findings, and initial radiographic and laboratory data in the context of their medical condition is felt to place them at decreased risk for further clinical deterioration. Furthermore, it is anticipated that the patient will be medically stable for discharge from the hospital  within 2 midnights of admission.   Author: Lonni SHAUNNA Dalton, MD 12/06/2024 2:24 PM  For on call review [email or web address].        [1]  Allergies Allergen Reactions   Cyclobenzaprine  Hives and Itching   Symbicort  [Budesonide -Formoterol  Fumarate] Other (See Comments)    Per daughter caused her lips to swell   "

## 2024-12-06 NOTE — ED Notes (Signed)
 RT called to trial pt off bipap.

## 2024-12-06 NOTE — ED Notes (Addendum)
 Pt ambulated approximately 50 ft on room air w/ continuous pulse oximetry. Pt o2 sat remained above 95% during entire time. At approx 25 ft pt stated she needed to sit, O2 sat at this time was 99%. Pt w/ mild expiratory wheezes, pt appeared anxious at this time. Pt directed to take a few deep breaths, she then appeared more comfortable and was able to continue walking back to room in NAD. MD made aware. Pt safely returned to stretcher, reconnected to monitor in room, daughter at bedside, call bell in reach, safety maintained.

## 2024-12-06 NOTE — ED Provider Notes (Signed)
 " [place] EMERGENCY DEPARTMENT AT [facility] Provider Note   CSN: [id number] Arrival date & time: 12/06/24  9382     Patient presents with: Wheezing   Amy Flowers is a 58 y.o. female.   Patient presented this morning for evaluation of SOB. History of pulmonary fibrosis, GERD, prediabetes, last used her nebulizer this morning and presented to the ED when symptoms of SOB progressed. While waiting to be seen, she developed sudden onset of wheezing, chest tightness, and was brought to the room in respiratory distress, O2 sat of 69%. She reports pain in the left chest. No history CAD.  The history is provided by the patient and a relative. No language interpreter was used.  Wheezing      Prior to Admission medications  Medication Sig Start Date End Date Taking? Authorizing Provider  acetaminophen  (TYLENOL ) 325 MG tablet Take 650 mg by mouth every 6 (six) hours as needed for headache.   Yes [provider]  albuterol  (PROVENTIL ) (2.5 MG/3ML) 0.083% nebulizer solution Take 3 mLs (2.5 mg total) by nebulization every 6 (six) hours as needed for wheezing or shortness of breath. 11/26/24  Yes Johnie, Carley A, NP  albuterol  (VENTOLIN  HFA) 108 (90 Base) MCG/ACT inhaler Inhale 1-2 puffs into the lungs every 6 (six) hours as needed for wheezing or shortness of breath. 11/26/24  Yes Johnie Flaming A, NP  budesonide  (PULMICORT ) 0.25 MG/2ML nebulizer solution Combine 1 vial with albuterol  in nebulizer and take via nebulation twice daily 11/04/24  Yes Vann, Jessica U, DO  famotidine  (PEPCID ) 20 MG tablet Take 1 tablet (20 mg total) by mouth daily. 10/03/24  Yes Darlean Ozell NOVAK, MD  ibuprofen  (ADVIL ) 200 MG tablet Take 400 mg by mouth every 6 (six) hours as needed for headache.   Yes [provider]  benzonatate  (TESSALON ) 100 MG capsule Take 1 capsule (100 mg total) by mouth every 8 (eight) hours. Patient not taking: Reported on 12/06/2024 11/26/24    Johnie Flaming A, NP  methocarbamol  (ROBAXIN ) 500 MG tablet Take 0.5 tablets (250 mg total) by mouth 2 (two) times daily. Patient not taking: Reported on 12/06/2024 11/04/24   Vann, Jessica U, DO    Allergies: Cyclobenzaprine  and Symbicort  [budesonide -formoterol  fumarate]    Review of Systems  Respiratory:  Positive for wheezing.     Updated Vital Signs BP (!) 149/84   Pulse (!) 103   Temp 98.2 F (36.8 C) (Axillary)   Resp (!) 22   SpO2 98%   Physical Exam Constitutional:      General: She is in acute distress.     Appearance: She is diaphoretic.  Eyes:     Pupils: Pupils are equal, round, and reactive to light.  Cardiovascular:     Rate and Rhythm: Tachycardia present.     Heart sounds: No murmur heard. Pulmonary:     Effort: Respiratory distress present.     Breath sounds: Wheezing present.     Comments: Marked tachypnea, sharply decreased air movement.  Abdominal:     General: There is no distension.  Musculoskeletal:        General: Normal range of motion.     Cervical back: Normal range of motion.     Right lower leg: No edema.     Left lower leg: No edema.  Skin:    General: Skin is warm.  Neurological:     Mental Status: She is oriented to person, place, and time.     (all  labs ordered are listed, but only abnormal results are displayed) Labs Reviewed  CBC WITH DIFFERENTIAL/PLATELET - Abnormal; Notable for the following components:      Result Value   Eosinophils Absolute 1.3 (*)    All other components within normal limits  COMPREHENSIVE METABOLIC PANEL WITH GFR - Abnormal; Notable for the following components:   Glucose, Bld 138 (*)    All other components within normal limits  TROPONIN T, HIGH SENSITIVITY - Abnormal; Notable for the following components:   Troponin T High Sensitivity 26 (*)    All other components within normal limits  RESP PANEL BY RT-PCR (RSV, FLU A&B, COVID)  RVPGX2  PRO BRAIN NATRIURETIC PEPTIDE     EKG: None  Radiology: DG Chest 1 View Result Date: 12/06/2024 EXAM: 1 VIEW(S) XRAY OF THE CHEST 12/06/2024 07:05:00 AM COMPARISON: 11/02/2024 CLINICAL HISTORY: SOB (shortness of breath) FINDINGS: LUNGS AND PLEURA: Diffuse interstitial coarsening with bronchial wall thickening. No consolidative change. No pleural effusion. No pneumothorax. HEART AND MEDIASTINUM: Aortic arch atherosclerosis. No acute abnormality of the cardiac silhouette. BONES AND SOFT TISSUES: No acute osseous abnormality. IMPRESSION: 1. Diffuse interstitial coarsening with bronchial wall thickening. No consolidative change. Electronically signed by: Waddell Calk MD 12/06/2024 07:09 AM EST RP Workstation: [id number]     .Critical Care  Performed by: Odell Balls, PA-C Authorized by: Odell Balls, PA-C   Critical care provider statement:    Critical care time (minutes):  45   Critical care was necessary to treat or prevent imminent or life-threatening deterioration of the following conditions:  Respiratory failure   Critical care was time spent personally by me on the following activities:  Evaluation of patient's response to treatment, examination of patient, interpretation of cardiac output measurements, ordering and review of laboratory studies, ordering and review of radiographic studies, pulse oximetry, re-evaluation of patient's condition and review of old charts    Medications Ordered in the ED  ipratropium-albuterol  (DUONEB) 0.5-2.5 (3) MG/3ML nebulizer solution 3 mL (3 mLs Nebulization Given 12/06/24 1408)  albuterol  (PROVENTIL ) (2.5 MG/3ML) 0.083% nebulizer solution 2.5 mg (2.5 mg Nebulization Given 12/06/24 0627)  methylPREDNISolone  sodium succinate (SOLU-MEDROL ) 125 mg/2 mL injection 125 mg (125 mg Intravenous Given 12/06/24 1037)  magnesium  sulfate IVPB 2 g 50 mL (0 g Intravenous Stopped 12/06/24 1054)  ipratropium-albuterol  (DUONEB) 0.5-2.5 (3) MG/3ML nebulizer solution (3 mLs  Given 12/06/24 1039)     Clinical Course as of 12/06/24 1436  Tue Dec 06, 2024  1036 Called to patient's room as she arrived from lobby. Significantly distressed on NRB at 100%, tachy to 115. Duo Neb ordered and started, BiPap ordered. Solumedrol 125 mg, Mag 2 grams provided.  [SU]  1058 Recheck: she is significantly more comfortable on BiPap. Breathing rate slowed, work of breathing significantly better.  [SU]  1202 Labs reviewed. Viral panel negative. CXR per radiology: IMPRESSION: 1. Diffuse interstitial coarsening with bronchial wall thickening. No consolidative change.  Weaned off BiPap and remains comfortable, on 2L via [place]. She has been seen and evaluated by ED attending, Dr. Yolande. Hospitalist paged for admission. [SU]    Clinical Course User Index [SU] Odell Balls, PA-C                                 Medical Decision Making Amount and/or Complexity of Data Reviewed Labs: ordered.  Risk Prescription drug management. Decision regarding hospitalization.       Final diagnoses:  Viral URI with  cough  History of pulmonary fibrosis  Respiratory distress    ED Discharge Orders     None          Odell Margit RIGGERS 12/06/24 1436    Yolande Lamar BROCKS, MD 12/13/24 0715  "

## 2024-12-06 NOTE — Hospital Course (Addendum)
 58 y.o. 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   New cough in the last few months, very distressing, referred to Pulmonology, HRCT showed ILD, still in process of work up.  Then in last week, developed URI symptoms (everyone in the house is sick).  Today symptoms way worse, came to the ER, in triage, SpO2 69% reportedly, placed on BiPAP due to WOB.    CXR with diffuse interstitial thickening, stable from baseline, no pneumonia.  Troponin with trivial elevation.  ECG ST otherwise normal.  BMP and CBC normal.  COVID/flu/RSV negative.  BNP normal.  Given Solumedrol, magnesium  and albuterol  and able to wean to 2L.  Albuterol  helped immediately and she was able to come off BiPAP but still requiring oxygen.

## 2024-12-14 ENCOUNTER — Telehealth: Payer: Self-pay

## 2024-12-14 ENCOUNTER — Ambulatory Visit (INDEPENDENT_AMBULATORY_CARE_PROVIDER_SITE_OTHER): Admitting: Pulmonary Disease

## 2024-12-14 ENCOUNTER — Telehealth: Payer: Self-pay | Admitting: Pulmonary Disease

## 2024-12-14 ENCOUNTER — Encounter: Payer: Self-pay | Admitting: Pulmonary Disease

## 2024-12-14 ENCOUNTER — Ambulatory Visit

## 2024-12-14 ENCOUNTER — Other Ambulatory Visit: Payer: Self-pay

## 2024-12-14 VITALS — HR 100 | Ht 61.0 in | Wt 146.0 lb

## 2024-12-14 DIAGNOSIS — K219 Gastro-esophageal reflux disease without esophagitis: Secondary | ICD-10-CM | POA: Diagnosis not present

## 2024-12-14 DIAGNOSIS — G4700 Insomnia, unspecified: Secondary | ICD-10-CM

## 2024-12-14 DIAGNOSIS — R053 Chronic cough: Secondary | ICD-10-CM

## 2024-12-14 DIAGNOSIS — K224 Dyskinesia of esophagus: Secondary | ICD-10-CM | POA: Diagnosis not present

## 2024-12-14 DIAGNOSIS — J8283 Eosinophilic asthma: Secondary | ICD-10-CM

## 2024-12-14 DIAGNOSIS — K449 Diaphragmatic hernia without obstruction or gangrene: Secondary | ICD-10-CM | POA: Diagnosis not present

## 2024-12-14 DIAGNOSIS — J849 Interstitial pulmonary disease, unspecified: Secondary | ICD-10-CM

## 2024-12-14 DIAGNOSIS — J4551 Severe persistent asthma with (acute) exacerbation: Secondary | ICD-10-CM

## 2024-12-14 MED ORDER — TRAZODONE HCL 50 MG PO TABS
50.0000 mg | ORAL_TABLET | Freq: Every day | ORAL | 0 refills | Status: AC
  Filled 2024-12-14: qty 30, 30d supply, fill #0

## 2024-12-14 MED ORDER — ARFORMOTEROL TARTRATE 15 MCG/2ML IN NEBU
15.0000 ug | INHALATION_SOLUTION | Freq: Two times a day (BID) | RESPIRATORY_TRACT | 3 refills | Status: AC
  Filled 2024-12-14 (×2): qty 120, 30d supply, fill #0

## 2024-12-14 MED ORDER — PREDNISONE 10 MG PO TABS
40.0000 mg | ORAL_TABLET | Freq: Every day | ORAL | 0 refills | Status: DC
  Filled 2024-12-14: qty 20, 5d supply, fill #0

## 2024-12-14 MED ORDER — BUDESONIDE 0.5 MG/2ML IN SUSP
0.5000 mg | Freq: Two times a day (BID) | RESPIRATORY_TRACT | 3 refills | Status: AC
  Filled 2024-12-14: qty 120, 30d supply, fill #0
  Filled 2024-12-14: qty 360, 90d supply, fill #0

## 2024-12-14 MED ORDER — ALBUTEROL SULFATE (2.5 MG/3ML) 0.083% IN NEBU: 2.5000 mg | INHALATION_SOLUTION | Freq: Once | RESPIRATORY_TRACT | Status: AC

## 2024-12-14 NOTE — Progress Notes (Addendum)
 "              Amy Flowers    [id number]    16-Apr-1966  Primary Care Physician:Newlin, Corrina, MD  Referring Physician: Delbert Corrina, MD [address] [place] [address] [place],  [place] [address]  Chief complaint: Follow-up for cough, ILD evaluation  HPI: 59 y.o. who  has a past medical history of Aortic atherosclerosis (06/26/2017), GERD (gastroesophageal reflux disease) (02/14/2019), Hiatal hernia (02/14/2019), and Vitamin D  deficiency (09/28/2017).  Discussed the use of AI scribe software for clinical note transcription with the patient, who gave verbal consent to proceed.  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   Dyspnea and wheezing - Persistent wheezing and dyspnea with two recent hospitalizations for respiratory symptoms. - Worsening symptoms yesterday, nearly requiring another hospital visit. - Exertional dyspnea with wheezing and cough limiting physical activity. - Episodes of severe dyspnea trigger marked anxiety, which further worsens breathing.  Cough - Recent viral illness with cough and fever triggered further exacerbation of respiratory symptoms. - Significant nocturnal cough with frequent awakenings interfering with sleep and daytime functioning. - Unable to complete some tests due to coughing fits.  Medication response and tolerability - Albuterol  nebulizer used for acute symptoms. - Pulmicort  nebulizer previously used after last hospitalization with good effect. - Prednisone  reliably improves cough and breathing but worsens blood sugar; only medication that substantially relieves respiratory symptoms. - Cannot tolerate inhalers due to facial itchiness; prefers nebulized therapy. - Using intermittent prednisone  for severe breathing issues.  Gastrointestinal symptoms - History of esophageal  dysmotility, gastroesophageal reflux disease (GERD), and hiatal hernia, confirmed by 2020 esophagram - Uses over-the-counter antacids such as Tums  Pulmonary findings and laboratory results - Diagnosed with interstitial lung disease with lung inflammation and scarring. - Autoimmune serologies have been normal. - Peripheral eosinophils are elevated.  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  No feather pillow comforter No h/o chemo/XRT/amiodarone/macrodantin/MTX  No exposure to asbestos, silica or other organic allergens  Smoking history: Never smoker, no exposure to wood smoke Travel history: Moved from [place] in 2018.  No significant recent travel Family history: No family history of lung disease   Outpatient Encounter Medications as of 12/14/2024  Medication Sig   acetaminophen  (TYLENOL ) 325 MG tablet Take 650 mg by mouth every 6 (six) hours as needed for headache.   albuterol  (PROVENTIL ) (2.5 MG/3ML) 0.083% nebulizer solution Take 3 mLs (2.5 mg total) by nebulization every 6 (six) hours as needed for wheezing or shortness of breath.   albuterol  (VENTOLIN  HFA) 108 (90 Base) MCG/ACT inhaler Inhale 1-2 puffs into the lungs every 6 (six) hours as needed for wheezing or shortness of breath.   benzonatate  (TESSALON ) 100 MG capsule Take 1 capsule (100 mg total) by mouth 3 (three) times daily as needed for cough.   famotidine  (PEPCID ) 20 MG tablet Take 1 tablet (20 mg total) by mouth daily.   guaiFENesin  (MUCINEX ) 600 MG 12 hr tablet Take 1 tablet (600 mg total) by mouth 2 (two) times daily.   ibuprofen  (ADVIL ) 200 MG tablet Take 400 mg by mouth every 6 (six) hours as needed for headache.   [Paused] budesonide  (PULMICORT ) 0.25 MG/2ML nebulizer solution Combine 1 vial with albuterol  in nebulizer and take via nebulation twice daily   methocarbamol  (ROBAXIN ) 500 MG  tablet Take 0.5 tablets (250 mg total) by mouth 2  (two) times daily. (Patient not taking: Reported on 12/14/2024)   No facility-administered encounter medications on file as of 12/14/2024.     Physical Exam: Today's Vitals   12/14/24 1418  TempSrc: Oral  Weight: 146 lb (66.2 kg)  Height: 5' 1 (1.549 m)   Body mass index is 27.59 kg/m.  Physical Exam GEN: No acute distress. CV: Regular rate and rhythm, no murmurs. LUNGS: Clear to auscultation bilaterally, normal respiratory effort. SKIN JOINTS: Warm and dry, no rash.  Data Reviewed: Imaging: CTA 07/22/2024-No pulm embolism.  Scattered areas of subpleural reticulation in the upper lobe.  CTA 09/07/2023-no pulm embolism.  Multiple pulmonary nodules in the right upper lobe measuring up to 4 mm.  Mucous plugging in the right upper lobe.  CTA 10/31/2024-no pulm embolism.  Peripheral and basilar predominant ground glass and subpleural reticular densities.  I have reviewed the images personally.   PFTs:  12/19/2024-unable to complete due to coughing.  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 Symptoms include wheezing, cough, and dyspnea, exacerbated by viral infections. Previous treatments with albuterol  nebulizer and prednisone  have provided some relief, but inhalers have been ineffective due to facial itchiness. Differential diagnosis includes interstitial lung disease, but asthma is prioritized for treatment. - Ordered Yupelri, Pulmicort  and Brovana  nebulizers for twice daily use. - Scheduled bronchoscopy to obtain lung samples for further diagnosis. - Prescribed prednisone  40 mg for 5 days, to be used only if breathing becomes severely impaired. - Initiated Dupixent injection therapy, pending pharmacy assistance for  cost management. - Advised to call if symptoms worsen before the next scheduled appointment.  Interstitial lung disease Possible interstitial lung disease with lung inflammation and scarring. Blood tests for autoimmune diseases are normal. Diagnosis is uncertain, and further investigation is needed. Bronchoscopy is preferred over lung biopsy due to lower risk. - Scheduled bronchoscopy to investigate lung condition further. - Will consider lung biopsy if bronchoscopy does not provide a definitive diagnosis.  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 Symptoms managed with over-the-counter antacids. - Prescribe Prilosec twice daily to manage GERD and potentially reduce cough.  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 Previous attempts to obtain sleep medication were unsuccessful. - Recommended over-the-counter melatonin for sleep. - Prescribed trazodone  for short-term use to aid sleep until respiratory symptoms improve.  Recommendations: Yupelri, Pulmicort , Brovana  nebulizer Bronchoscopy with BAL to evaluate eosinophilia Prilosec Trazodone   I personally spent a total of 45 minutes in the care of the patient today including preparing to see the patient, performing a medically appropriate exam/evaluation, referring and communicating with other health care professionals, documenting clinical information in the EHR, independently interpreting results, and communicating results.   Avner Stroder MD [facility] 12/14/2024, 2:20 PM  CC: Delbert Clam, MD    "

## 2024-12-14 NOTE — Progress Notes (Signed)
 Patient  unable to perform test.

## 2024-12-14 NOTE — Telephone Encounter (Signed)
 Please schedule the following:  Provider performing procedure:Slaton Reaser Diagnosis: ILD Which side if for nodule / mass? Bilateral Procedure: Bronchoscopy with BAL, biopsy Has patient been spoken to by Provider and given informed consent? Yes Anesthesia: General Do you need Fluro? Yes Duration of procedure: 1 hr Date: 12/16/2024 Alternate Date: 12/19/2024  Time: AM/ PM Location: [facility] Does patient have OSA? No DM? No Or Latex allergy ? No Medication Restriction/ Anticoagulate/Antiplatelet: No Pre-op Labs Ordered:determined by Anesthesia Imaging request: None  (If, SuperDimension CT Chest, please have STAT courier sent to ENDO)   Amy Coder MD [facility] See Amion for pager  If no response to pager , please call [phone] until 7pm After 7:00 pm call Elink  [phone] 12/14/2024, 2:58 PM

## 2024-12-14 NOTE — Patient Instructions (Signed)
 Patient  unable to perform test.

## 2024-12-14 NOTE — Patient Instructions (Addendum)
" °  VISIT SUMMARY: During your visit, we discussed your persistent wheezing, breathing difficulties, and cough, which have been exacerbated by a recent viral illness. We reviewed your current medications and made adjustments to better manage your symptoms. We also addressed your insomnia related to your respiratory issues.  YOUR PLAN: EOSINOPHILIC ASTHMA: You have chronic eosinophilic asthma with high peripheral eosinophils and significant lung inflammation, causing wheezing, cough, and difficulty breathing. -Use Pulmicort  and Brovana  nebulizers twice daily. -A bronchoscopy is scheduled to obtain lung samples for further diagnosis. -Take prednisone  40 mg for 5 days if your breathing becomes severely impaired. -We are starting Dupixent injection therapy, and we will assist with managing the cost. -Call us  if your symptoms worsen before your next appointment.  INTERSTITIAL LUNG DISEASE: You may have interstitial lung disease with lung inflammation and scarring, but the diagnosis is uncertain and requires further investigation. -A bronchoscopy is scheduled to investigate your lung condition further. -We will consider a lung biopsy if the bronchoscopy does not provide a definitive diagnosis.  INSOMNIA SECONDARY TO CHRONIC RESPIRATORY ILLNESS: Your difficulty sleeping is likely due to your chronic respiratory illness, with nocturnal coughing and anxiety affecting your sleep. -Try over-the-counter melatonin for sleep. -Take trazodone  for short-term use to help you sleep until your respiratory symptoms improve. "

## 2024-12-14 NOTE — Telephone Encounter (Signed)
 Received notification via secure chat from pharmacy that patient does not have insurance - was prescribed Yupelri and Performist. Request from Dr. Theophilus to initiate patient assistance for Yupelri and Performist.   Routing to PharmD to initiate.

## 2024-12-15 ENCOUNTER — Telehealth: Payer: Self-pay | Admitting: Pulmonary Disease

## 2024-12-15 ENCOUNTER — Telehealth (HOSPITAL_BASED_OUTPATIENT_CLINIC_OR_DEPARTMENT_OTHER): Payer: Self-pay | Admitting: Pulmonary Disease

## 2024-12-15 ENCOUNTER — Encounter (HOSPITAL_BASED_OUTPATIENT_CLINIC_OR_DEPARTMENT_OTHER): Payer: Self-pay | Admitting: Pulmonary Disease

## 2024-12-15 ENCOUNTER — Other Ambulatory Visit: Payer: Self-pay

## 2024-12-15 NOTE — Telephone Encounter (Signed)
 Copied from CRM [id number]. Topic: Clinical - Prescription Issue >> Dec 15, 2024  2:46 PM Rilla B wrote: Reason for CRM: Patient and daughter called in regarding medication she went to pick up. State the medication is over $1,000 and she would like to apply for patient assistant program.  Please call daughter to update on the process @ [phone]

## 2024-12-15 NOTE — Telephone Encounter (Signed)
 Patient asked if it would be ok to use her nebulizer the morning of the procedure. Are there time restraints as to when she should/shouldn't use the nebulizer prior and post procedure. Please advise.

## 2024-12-15 NOTE — Telephone Encounter (Signed)
 Patient is scheduled Dr Theophilus is aware of date and we will contact the patient

## 2024-12-15 NOTE — Telephone Encounter (Signed)
 Patient is aware about appt info

## 2024-12-16 ENCOUNTER — Encounter (HOSPITAL_COMMUNITY): Payer: Self-pay | Admitting: Pulmonary Disease

## 2024-12-16 ENCOUNTER — Ambulatory Visit: Payer: Self-pay | Admitting: Pulmonary Disease

## 2024-12-16 ENCOUNTER — Telehealth: Payer: Self-pay

## 2024-12-16 DIAGNOSIS — R059 Cough, unspecified: Secondary | ICD-10-CM | POA: Insufficient documentation

## 2024-12-16 NOTE — Telephone Encounter (Signed)
 FYI Only or Action Required?: Action required by provider: medication refill request, clinical question for provider, and update on patient condition.  Patient is followed in Pulmonology for chronic cough, last seen on 12/14/2024 by Mannam, Praveen, MD.  Called Nurse Triage reporting Shortness of Breath.  Symptoms began several weeks ago.  Interventions attempted: Prescription medications: albuterol ,Pulmicort .  Symptoms are: gradually worsening.  Triage Disposition: See HCP Within 4 Hours (Or PCP Triage)  Patient/caregiver understands and will follow disposition?: No, wishes to speak with PCP  FYI Only or Action Required?: Action required by provider: medication refill request, clinical question for provider, and update on patient condition.  Patient was last seen in primary care on 11/23/2024 by Newlin, Enobong, MD.  Called Nurse Triage reporting Shortness of Breath.  Symptoms began a week ago.  Interventions attempted: Prescription medications: albuterol , pulmicort .  Symptoms are: gradually worsening.  Triage Disposition: See HCP Within 4 Hours (Or PCP Triage)  Patient/caregiver understands and will follow disposition?: No, wishes to speak with PCPCopied from CRM #[id number]. Topic: Clinical - Red Word Triage >> Dec 16, 2024  4:54 PM Dedra B wrote: Red Word that prompted transfer to Nurse Triage: Patient experiences worsening SOB, coughin headness, and weakness after using budesonide  (PULMICORT ) 0.5 MG/2ML nebulizer solution. Warm transfer to NT. Reason for Disposition  [1] MILD difficulty breathing (e.g., minimal/no SOB at rest, SOB with walking, pulse < 100) AND [2] NEW-onset or WORSE than normal  Answer Assessment - Initial Assessment Questions Coughing stuff up, SOB Thick, yellow white, green  Using Albuterol  every 2-3 hours with little relief. Denies Fever. Informed patient needs to be seen in 4 hours due to worsening symptoms and that patient needs to go to UC. She said UC  always says they can never help her breathing problems and to contract their pulmonologist. She says they have told her in the past to call and not go to UC. Wants Albuterol  refilled. Stopp Pulmicort , do they need to start something else? [facility] [address] [facility]. RESPIRATORY STATUS: Describe your breathing? (e.g., wheezing, shortness of breath, unable to speak, severe coughing)      Chills, sore throat, coughing thick yellow,white,green, mucus.  2. ONSET: When did this breathing problem begin?      X 1 week, was seen and given Pulmicort . Daughter feels this is causing issues as she has hard time breathing after that treatment. They said they are not going to give any more medication until they hear from Dr . Mimi are monitoring O2 and staying around 95%-98%. Urged to continue.  3. PATTERN Does the difficult breathing come and go, or has it been constant since it started?      Come and goes. Happens after treatment of pulmicort  and walking around 4. SEVERITY: How bad is your breathing? (e.g., mild, moderate, severe)      She says patient is sitting and looks calm and okay. O2 level checked via home device while on phone:   98%  Protocols used: Breathing Difficulty-A-AH

## 2024-12-16 NOTE — Telephone Encounter (Signed)
 See 1/7 encounter. Pharmacy is initiating pt assistance for Yupelri.

## 2024-12-16 NOTE — Telephone Encounter (Signed)
 Received Dupixent new start paperwork. Opening benefits investigation in this thread, updates to follow.

## 2024-12-16 NOTE — Telephone Encounter (Signed)
 Upon further review -   Perforomist  is a non-specialty medication - will route to clinical team for assistance with PAP application.   Due to high volumes during renewal season, pharmacy team is taking on Yupelri in February 2026 per recent staff meeting.   Routing to clinical team for assistance with getting application completed. Please note, the application uses the same document for each medication through [employer] Patient Assistance. This document is scanned to Media Tab in patient's chart. Once completed, form may be faxed to the number indicated on the document.

## 2024-12-19 ENCOUNTER — Ambulatory Visit (HOSPITAL_COMMUNITY): Payer: Self-pay

## 2024-12-19 ENCOUNTER — Ambulatory Visit (HOSPITAL_COMMUNITY): Payer: Self-pay | Admitting: Anesthesiology

## 2024-12-19 ENCOUNTER — Other Ambulatory Visit (HOSPITAL_COMMUNITY): Payer: Self-pay

## 2024-12-19 ENCOUNTER — Encounter (HOSPITAL_COMMUNITY): Payer: Self-pay | Admitting: Pulmonary Disease

## 2024-12-19 ENCOUNTER — Other Ambulatory Visit: Payer: Self-pay

## 2024-12-19 ENCOUNTER — Encounter (HOSPITAL_COMMUNITY): Admission: RE | Disposition: A | Payer: Self-pay | Source: Home / Self Care | Attending: Pulmonary Disease

## 2024-12-19 ENCOUNTER — Other Ambulatory Visit: Payer: Self-pay | Admitting: Pulmonary Disease

## 2024-12-19 ENCOUNTER — Ambulatory Visit (HOSPITAL_COMMUNITY)
Admission: RE | Admit: 2024-12-19 | Discharge: 2024-12-19 | Disposition: A | Payer: Self-pay | Attending: Pulmonary Disease | Admitting: Pulmonary Disease

## 2024-12-19 DIAGNOSIS — R059 Cough, unspecified: Secondary | ICD-10-CM | POA: Insufficient documentation

## 2024-12-19 DIAGNOSIS — J849 Interstitial pulmonary disease, unspecified: Secondary | ICD-10-CM

## 2024-12-19 DIAGNOSIS — J8283 Eosinophilic asthma: Secondary | ICD-10-CM | POA: Diagnosis not present

## 2024-12-19 DIAGNOSIS — R053 Chronic cough: Secondary | ICD-10-CM | POA: Diagnosis not present

## 2024-12-19 DIAGNOSIS — K219 Gastro-esophageal reflux disease without esophagitis: Secondary | ICD-10-CM | POA: Diagnosis not present

## 2024-12-19 HISTORY — PX: VIDEO BRONCHOSCOPY: SHX5072

## 2024-12-19 LAB — BODY FLUID CELL COUNT WITH DIFFERENTIAL
Eos, Fluid: 11 %
Lymphs, Fluid: 1 %
Monocyte-Macrophage-Serous Fluid: 21 % — ABNORMAL LOW (ref 50–90)
Neutrophil Count, Fluid: 67 % — ABNORMAL HIGH (ref 0–25)
Total Nucleated Cell Count, Fluid: 380 uL (ref 0–1000)

## 2024-12-19 MED ORDER — PROPOFOL 10 MG/ML IV BOLUS
INTRAVENOUS | Status: DC | PRN
  Administered 2024-12-19: 120 mg via INTRAVENOUS

## 2024-12-19 MED ORDER — IPRATROPIUM-ALBUTEROL 0.5-2.5 (3) MG/3ML IN SOLN
3.0000 mL | RESPIRATORY_TRACT | 6 refills | Status: AC | PRN
  Filled 2024-12-19: qty 360, 20d supply, fill #0

## 2024-12-19 MED ORDER — FENTANYL CITRATE (PF) 100 MCG/2ML IJ SOLN
INTRAMUSCULAR | Status: AC
  Filled 2024-12-19: qty 2

## 2024-12-19 MED ORDER — PREDNISONE 10 MG PO TABS
20.0000 mg | ORAL_TABLET | Freq: Every day | ORAL | 0 refills | Status: AC
  Filled 2024-12-19: qty 180, 90d supply, fill #0

## 2024-12-19 MED ORDER — ROCURONIUM BROMIDE 100 MG/10ML IV SOLN
INTRAVENOUS | Status: DC | PRN
  Administered 2024-12-19: 40 mg via INTRAVENOUS

## 2024-12-19 MED ORDER — MIDAZOLAM HCL 5 MG/5ML IJ SOLN
INTRAMUSCULAR | Status: DC | PRN
  Administered 2024-12-19: 2 mg via INTRAVENOUS

## 2024-12-19 MED ORDER — BENZONATATE 100 MG PO CAPS
100.0000 mg | ORAL_CAPSULE | Freq: Three times a day (TID) | ORAL | 0 refills | Status: AC | PRN
  Filled 2024-12-19: qty 30, 10d supply, fill #0

## 2024-12-19 MED ORDER — LACTATED RINGERS IV SOLN: INTRAVENOUS | Status: DC | PRN

## 2024-12-19 MED ORDER — CHLORHEXIDINE GLUCONATE 0.12 % MT SOLN
OROMUCOSAL | Status: AC
  Filled 2024-12-19: qty 15

## 2024-12-19 MED ORDER — IPRATROPIUM-ALBUTEROL 0.5-2.5 (3) MG/3ML IN SOLN
RESPIRATORY_TRACT | Status: AC
  Filled 2024-12-19: qty 3

## 2024-12-19 MED ORDER — LIDOCAINE HCL (CARDIAC) PF 100 MG/5ML IV SOSY
PREFILLED_SYRINGE | INTRAVENOUS | Status: DC | PRN
  Administered 2024-12-19: 40 mg via INTRAVENOUS

## 2024-12-19 MED ORDER — SUGAMMADEX SODIUM 200 MG/2ML IV SOLN
INTRAVENOUS | Status: DC | PRN
  Administered 2024-12-19: 200 mg via INTRAVENOUS

## 2024-12-19 MED ORDER — IPRATROPIUM-ALBUTEROL 0.5-2.5 (3) MG/3ML IN SOLN
RESPIRATORY_TRACT | Status: DC | PRN
  Administered 2024-12-19: 3 mL via RESPIRATORY_TRACT

## 2024-12-19 MED ORDER — AMISULPRIDE (ANTIEMETIC) 5 MG/2ML IV SOLN
INTRAVENOUS | Status: AC
  Filled 2024-12-19: qty 4

## 2024-12-19 MED ORDER — MIDAZOLAM HCL 2 MG/2ML IJ SOLN
INTRAMUSCULAR | Status: AC
  Filled 2024-12-19: qty 2

## 2024-12-19 MED ORDER — ONDANSETRON HCL 4 MG/2ML IJ SOLN
INTRAMUSCULAR | Status: DC | PRN
  Administered 2024-12-19: 4 mg via INTRAVENOUS

## 2024-12-19 MED ORDER — CHLORHEXIDINE GLUCONATE 0.12 % MT SOLN
OROMUCOSAL | Status: DC | PRN
  Administered 2024-12-19: 15 mL via OROMUCOSAL

## 2024-12-19 MED ORDER — PHENYLEPHRINE 80 MCG/ML (10ML) SYRINGE FOR IV PUSH (FOR BLOOD PRESSURE SUPPORT)
PREFILLED_SYRINGE | INTRAVENOUS | Status: DC | PRN
  Administered 2024-12-19 (×2): 240 ug via INTRAVENOUS

## 2024-12-19 MED ORDER — FENTANYL CITRATE (PF) 100 MCG/2ML IJ SOLN
INTRAMUSCULAR | Status: DC | PRN
  Administered 2024-12-19: 50 ug via INTRAVENOUS

## 2024-12-19 MED ORDER — IPRATROPIUM-ALBUTEROL 0.5-2.5 (3) MG/3ML IN SOLN
3.0000 mL | Freq: Once | RESPIRATORY_TRACT | Status: AC
  Administered 2024-12-19: 3 mL via RESPIRATORY_TRACT

## 2024-12-19 MED ORDER — AMISULPRIDE (ANTIEMETIC) 5 MG/2ML IV SOLN
10.0000 mg | Freq: Once | INTRAVENOUS | Status: AC
  Administered 2024-12-19: 10 mg via INTRAVENOUS

## 2024-12-19 MED ORDER — DEXAMETHASONE SOD PHOSPHATE PF 10 MG/ML IJ SOLN
INTRAMUSCULAR | Status: DC | PRN
  Administered 2024-12-19: 8 mg via INTRAVENOUS

## 2024-12-19 NOTE — H&P (Signed)
 "              Amy Flowers    [id number]    [birth year]  Primary Care Physician:Newlin, Corrina, MD  Referring Physician: No referring provider defined for this encounter.  Chief complaint: Referral for cough, ILD evaluation  HPI: 59 y.o. who  has a past medical history of Aortic atherosclerosis (06/26/2017), GERD (gastroesophageal reflux disease) (02/14/2019), Hiatal hernia (02/14/2019), and Vitamin D  deficiency (09/28/2017).  Discussed the use of AI scribe software for clinical note transcription with the patient, who gave verbal consent to proceed.  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   Dyspnea and wheezing - Persistent wheezing and dyspnea with two recent hospitalizations for respiratory symptoms. - Worsening symptoms yesterday, nearly requiring another hospital visit. - Exertional dyspnea with wheezing and cough limiting physical activity. - Episodes of severe dyspnea trigger marked anxiety, which further worsens breathing.  Cough - Recent viral illness with cough and fever triggered further exacerbation of respiratory symptoms. - Significant nocturnal cough with frequent awakenings interfering with sleep and daytime functioning. - Unable to complete some tests due to coughing fits.  Medication response and tolerability - Albuterol  nebulizer used for acute symptoms. - Pulmicort  nebulizer previously used after last hospitalization with good effect. - Prednisone  reliably improves cough and breathing but worsens blood sugar; only medication that substantially relieves respiratory symptoms. - Cannot tolerate inhalers due to facial itchiness; prefers nebulized therapy. - Using intermittent prednisone  for severe breathing issues.  Gastrointestinal symptoms - History of esophageal dysmotility, gastroesophageal  reflux disease (GERD), and hiatal hernia, confirmed by 2020 esophagram - Uses over-the-counter antacids such as Tums  Pulmonary findings and laboratory results - Diagnosed with interstitial lung disease with lung inflammation and scarring. - Autoimmune serologies have been normal. - Peripheral eosinophils are elevated.  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  No feather pillow comforter No h/o chemo/XRT/amiodarone/macrodantin/MTX  No exposure to asbestos, silica or other organic allergens  Smoking history: Never smoker, no exposure to wood smoke Travel history: Moved from [place] in 2018.  No significant recent travel Family history: No family history of lung disease   Outpatient Encounter Medications as of 12/14/2024  Medication Sig   acetaminophen  (TYLENOL ) 325 MG tablet Take 650 mg by mouth every 6 (six) hours as needed for headache.   albuterol  (PROVENTIL ) (2.5 MG/3ML) 0.083% nebulizer solution Take 3 mLs (2.5 mg total) by nebulization every 6 (six) hours as needed for wheezing or shortness of breath.   albuterol  (VENTOLIN  HFA) 108 (90 Base) MCG/ACT inhaler Inhale 1-2 puffs into the lungs every 6 (six) hours as needed for wheezing or shortness of breath.   benzonatate  (TESSALON ) 100 MG capsule Take 1 capsule (100 mg total) by mouth 3 (three) times daily as needed for cough.   famotidine  (PEPCID ) 20 MG tablet Take 1 tablet (20 mg total) by mouth daily.   guaiFENesin  (MUCINEX ) 600 MG 12 hr tablet Take 1 tablet (600 mg total) by mouth 2 (two) times daily.   ibuprofen  (ADVIL ) 200 MG tablet Take 400 mg by mouth every 6 (six) hours as needed for headache.   [Paused] budesonide  (PULMICORT ) 0.25 MG/2ML nebulizer solution Combine 1 vial with albuterol  in nebulizer and take via nebulation twice daily   methocarbamol  (ROBAXIN ) 500 MG tablet Take 0.5 tablets (250 mg  total) by mouth 2 (two) times daily. (Patient not  taking: Reported on 12/14/2024)   No facility-administered encounter medications on file as of 12/14/2024.     Physical Exam: Today's Vitals   12/19/24 0858  BP: (!) 149/71  Pulse: 94  Resp: 20  Temp: 98.9 F (37.2 C)  TempSrc: Temporal  SpO2: 92%  Weight: 64 kg  Height: 5' 1 (1.549 m)  PainSc: 0-No pain   Body mass index is 26.64 kg/m.  Physical Exam No acute distress Mild expiratory wheeze Heart rate regular rate and rhythm.  Data Reviewed: Imaging: CTA 07/22/2024-No pulm embolism.  Scattered areas of subpleural reticulation in the upper lobe.  CTA 09/07/2023-no pulm embolism.  Multiple pulmonary nodules in the right upper lobe measuring up to 4 mm.  Mucous plugging in the right upper lobe.  CTA 10/31/2024-no pulm embolism.  Peripheral and basilar predominant ground glass and subpleural reticular densities.  I have reviewed the images personally.   PFTs:  12/19/2024-unable to complete due to coughing.  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 Symptoms include wheezing, cough, and dyspnea, exacerbated by viral infections. Previous treatments with albuterol  nebulizer and prednisone  have provided some relief, but inhalers have been ineffective due to facial itchiness. Differential diagnosis includes interstitial lung disease, but asthma is prioritized for treatment.  Presents for bronchoscopy with BAL today Risk/benefit reviewed in detail and she has agreed to proceed.   Lonna Coder MD  Pulmonary and Critical Care 12/19/2024, 10:20 AM  CC: No ref. provider found    "

## 2024-12-19 NOTE — Anesthesia Preprocedure Evaluation (Signed)
"                                    Anesthesia Evaluation    Reviewed: Allergy  & Precautions, Patient's Chart, lab work & pertinent test results  Airway Mallampati: II  TM Distance: >3 FB Neck ROM: Full    Dental  (+) Missing, Poor Dentition   Pulmonary shortness of breath and with exertion    + decreased breath sounds      Cardiovascular negative cardio ROS  Rhythm:Regular Rate:Normal     Neuro/Psych  Neuromuscular disease    GI/Hepatic Neg liver ROS, hiatal hernia,GERD  Medicated,,  Endo/Other  negative endocrine ROS    Renal/GU negative Renal ROS     Musculoskeletal  (+) Arthritis ,    Abdominal   Peds  Hematology negative hematology ROS (+)   Anesthesia Other Findings   Reproductive/Obstetrics                              Anesthesia Physical Anesthesia Plan  ASA: 2  Anesthesia Plan: General   Post-op Pain Management: Minimal or no pain anticipated   Induction: Intravenous  PONV Risk Score and Plan: 3 and Ondansetron , Midazolam , Treatment may vary due to age or medical condition and Dexamethasone   Airway Management Planned: Oral ETT  Additional Equipment: None  Intra-op Plan:   Post-operative Plan: Extubation in OR  Informed Consent: I have reviewed the patients History and Physical, chart, labs and discussed the procedure including the risks, benefits and alternatives for the proposed anesthesia with the patient or authorized representative who has indicated his/her understanding and acceptance.     Dental advisory given  Plan Discussed with: CRNA  Anesthesia Plan Comments:         Anesthesia Quick Evaluation  "

## 2024-12-19 NOTE — Anesthesia Postprocedure Evaluation (Signed)
"   Anesthesia Post Note  Patient: Amy Flowers  Procedure(s) Performed: BRONCHOSCOPY, WITH FLUOROSCOPY (Bilateral)     Patient location during evaluation: PACU Anesthesia Type: General Level of consciousness: awake and alert Pain management: pain level controlled Vital Signs Assessment: post-procedure vital signs reviewed and stable Respiratory status: spontaneous breathing, nonlabored ventilation, respiratory function stable and patient connected to nasal cannula oxygen Cardiovascular status: blood pressure returned to baseline and stable Postop Assessment: no apparent nausea or vomiting Anesthetic complications: no   No notable events documented.  Last Vitals:  Vitals:   12/19/24 1250 12/19/24 1252  BP: 113/67 113/67  Pulse: 97 97  Resp: (!) 24 (!) 26  Temp:    SpO2: 91% (!) 88%    Last Pain:  Vitals:   12/19/24 1230  TempSrc:   PainSc: 0-No pain                 [name]      "

## 2024-12-19 NOTE — Progress Notes (Addendum)
 Pt presented to endo today for bronchoscopy with Dr Theophilus. Post-operatively, pt coughing, difficulty maintaining oxygen saturation, initially requiring 4-5 LPM by [place], now down to 2 LPM by [place].   Pt had one episode of clear emesis, MDA Hollis notified, VO for 10 mg barhemsys  IV. Medication given as ordered.   Pt was given a duoneb post-operatively by E. Ascencion, CHARITY FUNDRAISER. On auscultation, pt still had expiratory wheezing on the L side. MD Mannam notified and VO for an additional duoneb. Medication given as ordered.  Attempted room air trials x2, pt's O2 saturation down to 85% on RA. Per MDA Mannam, room air O2 saturation goal of 88% or greater. Pt currently satting 92% on 2 LPM by [place].    Amy VEAR Pouch, RN 12/19/2024 12:31 PM  Addendum 1234: Pt's wheezing improved post-duoneb. Now mild end-expiratory wheezes on the L side, both upper and lower fields. Right lung remains clear to auscultation in upper and lower fields. Pt saturating 92% on 1 LPM by .  Amy VEAR Pouch, RN 12/19/2024 12:36 PM  Addendum 1325: Room air trial completed, pt satting approx. 88% on RA. MD Mannam notified, okay with d/c. Pt d/c'ed to home in care of daughter.  Amy VEAR Pouch, RN 12/19/2024 1:25 PM

## 2024-12-19 NOTE — Telephone Encounter (Signed)
 Patient assistance forms printed and given to [facility] as she is Dr. Jiles CMA.  [facility] can you please follow up on this?  Thank you.

## 2024-12-19 NOTE — Anesthesia Procedure Notes (Signed)
 Procedure Name: Intubation Date/Time: 12/19/2024 10:40 AM  Performed by: Deeann Eva BROCKS, CRNAPre-anesthesia Checklist: Patient identified, Emergency Drugs available, Suction available and Patient being monitored Patient Re-evaluated:Patient Re-evaluated prior to induction Oxygen Delivery Method: Circle System Utilized Preoxygenation: Pre-oxygenation with 100% oxygen Induction Type: IV induction Ventilation: Mask ventilation without difficulty Laryngoscope Size: Mac and 3 Grade View: Grade II Tube type: Oral Tube size: 8.0 mm Number of attempts: 1 Airway Equipment and Method: Stylet and Oral airway Placement Confirmation: ETT inserted through vocal cords under direct vision, positive ETCO2 and breath sounds checked- equal and bilateral Secured at: 21 cm Tube secured with: Tape Dental Injury: Teeth and Oropharynx as per pre-operative assessment

## 2024-12-19 NOTE — Telephone Encounter (Signed)
 Patient had bronch today.  Nothing further needed.

## 2024-12-19 NOTE — Progress Notes (Addendum)
 PCCM note  Post bronchoscopy she has ongoing cough with wheezing.  Given DuoNebs in recovery area with some improvement I have asked the daughter to start prednisone  40 mg/day for 5 days and then continue prednisone  at 20 mg/day until she sees me back in clinic later this month Will add Tessalon  for ongoing cough and duonebs for home Prescriptions called to her pharmacy.  Lonna Coder MD [place] [facility] 12/19/2024, 1:02 PM

## 2024-12-19 NOTE — Op Note (Signed)
 [facility] Cardiopulmonary Patient Name: Amy Flowers Procedure Date: 12/19/2024 MRN: [id number] Attending MD: Lonna Coder , MD, [id number] Date of Birth: 26-Apr-1966 CSN: [id number] Age: 59 Admit Type: Ambulatory Ethnicity: Not Hispanic or Latino Procedure:             Bronchoscopy Indications:           Interstitial lung disease Providers:             Lonna Coder, MD, Darleene Bare, RN, [place],                         Technician Referring MD:           Medicines:             General Anesthesia Complications:         No immediate complications Estimated Blood Loss:  Estimated blood loss: none. Procedure:      Pre-Anesthesia Assessment:      - A History and Physical has been performed. Patient meds and allergies       have been reviewed. The risks and benefits of the procedure and the       sedation options and risks were discussed with the patient. All       questions were answered and informed consent was obtained. Patient       identification and proposed procedure were verified prior to the       procedure by the physician in the pre-procedure area. Mental Status       Examination: alert and oriented. Airway Examination: normal       oropharyngeal airway. Respiratory Examination: expiratory wheezes. CV       Examination: RRR, no murmurs, no S3 or S4. ASA Grade Assessment: II - A       patient with mild systemic disease. After reviewing the risks and       benefits, the patient was deemed in satisfactory condition to undergo       the procedure. The anesthesia plan was to use general anesthesia.       Immediately prior to administration of medications, the patient was       re-assessed for adequacy to receive sedatives. The heart rate,       respiratory rate, oxygen saturations, blood pressure, adequacy of       pulmonary ventilation, and response to care were monitored throughout       the procedure. The physical status of the patient was  re-assessed after       the procedure.      After obtaining informed consent, the bronchoscope was passed under       direct vision. Throughout the procedure, the patient's blood pressure,       pulse, and oxygen saturations were monitored continuously. the BF-H190       ([id number]) Olympus bronchoscope was introduced through the mouth, via the       endotracheal tube (the patient was intubated for the procedure) and       advanced to the tracheobronchial tree of both lungs. Findings:      The bronchoscope was advanced until wedged at the desired location for       bronchoalveolar lavage. BAL was performed in the RML lateral segment       (B4) of the lung and sent for cell count, bacterial culture, viral       smears & culture, and fungal & AFB analysis and cytology. 180  mL of       fluid were instilled. 90 mL were returned. The return was mucoid. There       were no mucoid plugs in the return fluid. Multiple specimens were       obtained and pooled into one specimen, which was sent for analysis. Impression:      - Interstitial lung disease      - Bronchoalveolar lavage was performed. Moderate Sedation:      GA Recommendation:      - Await BAL, culture and cytology results. Procedure Code(s):      --- Professional ---      [phone], Bronchoscopy, rigid or flexible, including fluoroscopic guidance,       when performed; with bronchial alveolar lavage Diagnosis Code(s):      --- Professional ---      J84.9, Interstitial pulmonary disease, unspecified CPT copyright 2022 American Medical Association. All rights reserved. The codes documented in this report are preliminary and upon coder review may  be revised to meet current compliance requirements. Jasani Lengel, MD Doree Kuehne, MD 12/19/2024 11:08:53 AM Number of Addenda: 0 Scope In: Scope Out:

## 2024-12-19 NOTE — Addendum Note (Signed)
 Addended byBETHA THEOPHILUS ROOSEVELT on: 12/19/2024 01:05 PM   Modules accepted: Orders

## 2024-12-19 NOTE — Telephone Encounter (Signed)
 I already called her and spoke to her on Sunday before the procedure.

## 2024-12-19 NOTE — Transfer of Care (Signed)
 Immediate Anesthesia Transfer of Care Note  Patient: Amy Flowers  Procedure(s) Performed: BRONCHOSCOPY, WITH FLUOROSCOPY (Bilateral)  Patient Location: PACU  Anesthesia Type:General  Level of Consciousness: drowsy  Airway & Oxygen Therapy: Patient Spontanous Breathing and Patient connected to face mask oxygen  Post-op Assessment: Report given to RN and Post -op Vital signs reviewed and stable  Post vital signs: Reviewed and stable  Last Vitals:  Vitals Value Taken Time  BP 108/50 12/19/24 11:10  Temp    Pulse 87 12/19/24 11:12  Resp 12 12/19/24 11:12  SpO2 100 % 12/19/24 11:12  Vitals shown include unfiled device data.  Last Pain:  Vitals:   12/19/24 0858  TempSrc: Temporal  PainSc: 0-No pain         Complications: No notable events documented.

## 2024-12-19 NOTE — Telephone Encounter (Signed)
 Duoneb was refilled on 12/19/24.

## 2024-12-21 ENCOUNTER — Other Ambulatory Visit: Payer: Self-pay

## 2024-12-21 ENCOUNTER — Ambulatory Visit: Payer: Self-pay | Admitting: Pulmonary Disease

## 2024-12-21 ENCOUNTER — Encounter (HOSPITAL_COMMUNITY): Payer: Self-pay | Admitting: Pulmonary Disease

## 2024-12-21 LAB — ACID FAST SMEAR (AFB, MYCOBACTERIA)
Acid Fast Smear: NEGATIVE
Source (AFB): 183518

## 2024-12-21 LAB — CULTURE, BAL-QUANTITATIVE W GRAM STAIN: Culture: 30000 — AB

## 2024-12-21 LAB — FUNGUS STAIN: Fungal Source: 8136

## 2024-12-21 LAB — PNEUMOCYSTIS SMEAR, DFA
Pneumocystis Smear, DFA: NEGATIVE
Source of Sample: 180232

## 2024-12-21 LAB — CYTOLOGY - NON PAP

## 2024-12-21 LAB — FUNGAL STAIN REFLEX

## 2024-12-21 MED ORDER — CIPROFLOXACIN HCL 500 MG PO TABS
500.0000 mg | ORAL_TABLET | Freq: Two times a day (BID) | ORAL | 0 refills | Status: AC
  Filled 2024-12-21: qty 14, 7d supply, fill #0

## 2024-12-23 LAB — FUNGUS CULTURE WITH STAIN: Fungal Source: 188243

## 2024-12-23 LAB — FUNGUS CULTURE RESULT

## 2024-12-23 NOTE — Telephone Encounter (Signed)
 Form placed in Dr Jiles box for signature

## 2024-12-24 LAB — ANAEROBIC CULTURE W GRAM STAIN

## 2025-01-04 ENCOUNTER — Encounter: Payer: Self-pay | Admitting: Pulmonary Disease

## 2025-01-04 ENCOUNTER — Ambulatory Visit (INDEPENDENT_AMBULATORY_CARE_PROVIDER_SITE_OTHER): Payer: Self-pay | Admitting: Pulmonary Disease

## 2025-01-04 VITALS — BP 114/72 | HR 101 | Temp 98.2°F | Wt 146.0 lb

## 2025-01-04 DIAGNOSIS — R053 Chronic cough: Secondary | ICD-10-CM | POA: Diagnosis not present

## 2025-01-04 DIAGNOSIS — J8283 Eosinophilic asthma: Secondary | ICD-10-CM

## 2025-01-04 DIAGNOSIS — K449 Diaphragmatic hernia without obstruction or gangrene: Secondary | ICD-10-CM | POA: Diagnosis not present

## 2025-01-04 DIAGNOSIS — K224 Dyskinesia of esophagus: Secondary | ICD-10-CM | POA: Diagnosis not present

## 2025-01-04 DIAGNOSIS — K219 Gastro-esophageal reflux disease without esophagitis: Secondary | ICD-10-CM | POA: Diagnosis not present

## 2025-01-04 DIAGNOSIS — Z8709 Personal history of other diseases of the respiratory system: Secondary | ICD-10-CM

## 2025-01-04 DIAGNOSIS — J4551 Severe persistent asthma with (acute) exacerbation: Secondary | ICD-10-CM

## 2025-01-04 DIAGNOSIS — J849 Interstitial pulmonary disease, unspecified: Secondary | ICD-10-CM

## 2025-01-04 NOTE — Patient Instructions (Signed)
" °  VISIT SUMMARY: You had a follow-up visit after your bronchoscopy and treatment for a Pseudomonas infection. Your respiratory symptoms have improved significantly, and you no longer have a cough or shortness of breath. You completed a course of antibiotics and are currently taking prednisone  for asthma management.  YOUR PLAN: ASTHMA: Your asthma symptoms have improved after the bronchoscopy and antibiotic treatment. Prednisone  has helped reduce your symptoms, but it is important to taper off this medication to avoid side effects. -Reduce prednisone  dose by 5 mg every three days: 15 mg for three days, 10 mg for three days, 5 mg for three days, then stop. -Monitor for any worsening of breathing symptoms. If symptoms worsen, consider resuming nebulizer use. -We will discuss the potential need for injection therapy if symptoms recur after stopping prednisone .  PSEUDOMONAS RESPIRATORY INFECTION: You were diagnosed with a Pseudomonas infection after your bronchoscopy and completed a 7-day course of ciprofloxacin , which resolved your symptoms. -No further treatment is needed at this time as the infection has resolved.    Contains text generated by [employer]bridge.   "

## 2025-01-04 NOTE — Progress Notes (Unsigned)
 "              Amy Flowers    [id number]    [birth year]  Primary Care Physician:Newlin, Corrina, MD  Referring Physician: Delbert Corrina, MD [address] [place] [address] [place],  [place] [address]  Chief complaint: Follow-up for eosinophilic asthma, Pseudomonas pneumonia, ILD evaluation  HPI: 59 y.o. who  has a past medical history of Aortic atherosclerosis (06/26/2017), GERD (gastroesophageal reflux disease) (02/14/2019), Hiatal hernia (02/14/2019), and Vitamin D  deficiency (09/28/2017).  Discussed the use of AI scribe software for clinical note transcription with the patient, who gave verbal consent to proceed.  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   Dyspnea and wheezing - Persistent wheezing and dyspnea with two recent hospitalizations for respiratory symptoms. - Worsening symptoms yesterday, nearly requiring another hospital visit. - Exertional dyspnea with wheezing and cough limiting physical activity. - Episodes of severe dyspnea trigger marked anxiety, which further worsens breathing.  Cough - Recent viral illness with cough and fever triggered further exacerbation of respiratory symptoms. - Significant nocturnal cough with frequent awakenings interfering with sleep and daytime functioning. - Unable to complete some tests due to coughing fits.  Medication response and tolerability - Albuterol  nebulizer used for acute symptoms. - Pulmicort  nebulizer previously used after last hospitalization with good effect. - Prednisone  reliably improves cough and breathing but worsens blood sugar; only medication that substantially relieves respiratory symptoms. - Cannot tolerate inhalers due to facial itchiness; prefers nebulized therapy. - Using intermittent prednisone  for severe breathing issues.  Gastrointestinal  symptoms - History of esophageal dysmotility, gastroesophageal reflux disease (GERD), and hiatal hernia, confirmed by 2020 esophagram - Uses over-the-counter antacids such as Tums  Pulmonary findings and laboratory results - Diagnosed with interstitial lung disease with lung inflammation and scarring. - Autoimmune serologies have been normal. - Peripheral eosinophils are elevated.  Interval history: Amy Flowers is a 59 year old female with asthma who presents for follow-up after bronchoscopy Jan 2026 and treatment for Pseudomonas infection.  Post-bronchoscopy respiratory symptoms - Cough developed after bronchoscopy, resolved within one week with cough medicine - No current cough, mucus production, or shortness of breath - No wheezing or other asthma symptoms - No respiratory limitations in daily activities at home - Breathing has markedly improved since bronchoscopy - No need for nebulizer treatments since procedure  Recent pulmonary infection - Diagnosed with Pseudomonas infection following bronchoscopy in January 2026 - Completed a 7-day course of ciprofloxacin  - Currently feels well with resolution of infection-related symptoms  Corticosteroid use - Taking prednisone  20 mg daily - Last dose taken yesterday - Subjective improvement in shortness of breath with prednisone   Relevant Pulmonary history: Pets: No pets 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  No feather pillow comforter No h/o chemo/XRT/amiodarone/macrodantin/MTX  No exposure to asbestos, silica or other organic allergens  Smoking history: Never smoker, no exposure to wood smoke Travel history: Moved from [place] in 2018.  No significant recent travel Family history: No family history of lung disease   Outpatient Encounter Medications as of 01/04/2025  Medication Sig   acetaminophen  (TYLENOL ) 325 MG tablet Take 650 mg by mouth every 6 (six) hours as needed for  headache.   albuterol  (PROVENTIL ) (2.5 MG/3ML) 0.083% nebulizer solution Take 3 mLs (2.5 mg total) by nebulization every 6 (six) hours as needed  for wheezing or shortness of breath.   albuterol  (VENTOLIN  HFA) 108 (90 Base) MCG/ACT inhaler Inhale 1-2 puffs into the lungs every 6 (six) hours as needed for wheezing or shortness of breath.   arformoterol  (BROVANA ) 15 MCG/2ML NEBU Take 2 mLs (15 mcg total) by nebulization 2 (two) times daily.   benzonatate  (TESSALON ) 100 MG capsule Take 1 capsule (100 mg total) by mouth 3 (three) times daily as needed for cough.   budesonide  (PULMICORT ) 0.5 MG/2ML nebulizer solution Take 2 mLs (0.5 mg total) by nebulization 2 (two) times daily.   famotidine  (PEPCID ) 20 MG tablet Take 1 tablet (20 mg total) by mouth daily.   guaiFENesin  (MUCINEX ) 600 MG 12 hr tablet Take 1 tablet (600 mg total) by mouth 2 (two) times daily.   ipratropium-albuterol  (DUONEB) 0.5-2.5 (3) MG/3ML SOLN Take 3 mLs by nebulization every 4 (four) hours as needed.   predniSONE  (DELTASONE ) 10 MG tablet Take 2 tablets (20 mg total) by mouth daily with breakfast.   ibuprofen  (ADVIL ) 200 MG tablet Take 400 mg by mouth every 6 (six) hours as needed for headache. (Patient not taking: Reported on 01/04/2025)   methocarbamol  (ROBAXIN ) 500 MG tablet Take 0.5 tablets (250 mg total) by mouth 2 (two) times daily. (Patient not taking: Reported on 01/04/2025)   traZODone  (DESYREL ) 50 MG tablet Take 1 tablet (50 mg total) by mouth at bedtime. (Patient not taking: Reported on 01/04/2025)   Facility-Administered Encounter Medications as of 01/04/2025  Medication   albuterol  (PROVENTIL ) (2.5 MG/3ML) 0.083% nebulizer solution 2.5 mg     Physical Exam: Today's Vitals   01/04/25 1454  BP: 114/72  Pulse: (!) 101  Temp: 98.2 F (36.8 C)  TempSrc: Oral  SpO2: 97%  Weight: 146 lb (66.2 kg)   Body mass index is 27.59 kg/m.  Physical Exam GEN: No acute distress CV: Regular rate and rhythm no murmurs LUNGS:  Clear to auscultation bilaterally normal respiratory effort SKIN JOINTS: Warm and dry no rash  Data Reviewed: Imaging: CTA 07/22/2024-No pulm embolism.  Scattered areas of subpleural reticulation in the upper lobe.  CTA 09/07/2023-no pulm embolism.  Multiple pulmonary nodules in the right upper lobe measuring up to 4 mm.  Mucous plugging in the right upper lobe.  CTA 10/31/2024-no pulm embolism.  Peripheral and basilar predominant ground glass and subpleural reticular densities.  I have reviewed the images personally.   PFTs:  12/19/2024-unable to complete due to coughing.  FENO 09/13/2024-unable to complete  Labs: CTD serologies 10/14/2023-negative  CBC 07/28/2024-WBC 4.4, eosinophils 23%, absolute eosinophil count 1012 IgE 11/24/2023-672  Bronchoscopy 12/19/2024- BAL 318 nucleated cells, 67% neutrophils, 11% eos, 21% monocyte macrophage Cultures-30K pansensitive Pseudomonas Negative cytology  Assessment & Plan Eosinophilic asthma Symptoms have improved post-bronchoscopy and antibiotic treatment. Prednisone  has been effective in reducing symptoms, but long-term use is not advisable due to potential side effects such as bone weakening. No current wheezing or shortness of breath reported.  She has been intolerant of all inhalers so far due to cough and is currently on Brovana , Pulmicort  and Yupelri - Taper prednisone  by reducing dose by 5 mg every three days: 15 mg for three days, 10 mg for three days, 5 mg for three days, then stop. - Monitor for worsening of breathing symptoms; if symptoms worsen, consider resuming nebulizer use. - Will discuss potential need for biologic injection therapy if symptoms recur after stopping prednisone . - Currently is uninsured so we will hold off PFT testing until she is covered.  Pseudomonas respiratory infection  Identified during recent bronchoscopy. Completed a 7-day course of ciprofloxacin  with improvement in symptoms post-treatment, indicating  effective resolution of the infection.  Interstitial lung disease Possible interstitial lung disease with lung inflammation and scarring. Blood tests for autoimmune diseases are normal. Diagnosis is uncertain - Will need reassessment later this year to make sure there is no residual inflammation after Pseudomonas infection has been treated.  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 Symptoms managed with over-the-counter antacids. - Prescribe Prilosec twice daily to manage GERD and potentially reduce cough.  Recommendations: Yupelri, Pulmicort , Brovana  nebulizers with patient assistance Taper prednisone  Continue nebulizers Prilosec  Lonna Coder MD [facility] 01/04/2025, 3:20 PM  CC: Newlin, Enobong, MD "

## 2025-01-05 ENCOUNTER — Other Ambulatory Visit: Payer: Self-pay

## 2025-01-12 ENCOUNTER — Other Ambulatory Visit (HOSPITAL_COMMUNITY): Payer: Self-pay

## 2025-01-12 NOTE — Telephone Encounter (Signed)
 Submitted a Prior Authorization request to [employer] for DUPIXENT via CoverMyMeds. Will update once we receive a response.  Key: [id number]

## 2025-01-13 ENCOUNTER — Telehealth: Payer: Self-pay

## 2025-01-13 NOTE — Telephone Encounter (Addendum)
 Rx for Performist faxed to [employer], fax was successful. However [employer]  said form faxed is outdated; they need an up to date/new form filled out by the patient and provider. New form was faxed to us ; Form was placed in Dr Jiles folder for signature. ( Spoke to [place]).

## 2025-04-05 ENCOUNTER — Ambulatory Visit: Admitting: Pulmonary Disease

## 2025-11-23 ENCOUNTER — Ambulatory Visit: Payer: Self-pay | Admitting: Family Medicine
